# Patient Record
Sex: Male | Born: 1955 | Race: White | Hispanic: No | Smoking: Current every day smoker
Health system: Southern US, Community
[De-identification: ages and names within clinical notes are randomized; demographics above are authoritative.]

## PROBLEM LIST (undated history)

## (undated) DIAGNOSIS — S8261XA Displaced fracture of lateral malleolus of right fibula, initial encounter for closed fracture: Secondary | ICD-10-CM

## (undated) DIAGNOSIS — G4730 Sleep apnea, unspecified: Secondary | ICD-10-CM

## (undated) DIAGNOSIS — I10 Essential (primary) hypertension: Secondary | ICD-10-CM

## (undated) DIAGNOSIS — M1990 Unspecified osteoarthritis, unspecified site: Secondary | ICD-10-CM

## (undated) DIAGNOSIS — E119 Type 2 diabetes mellitus without complications: Secondary | ICD-10-CM

## (undated) HISTORY — PX: NO PAST SURGERIES: SHX2092

## (undated) HISTORY — PX: LEG SURGERY: SHX1003

## (undated) HISTORY — PX: TONSILLECTOMY: SHX5217

## (undated) HISTORY — PX: FRACTURE SURGERY: SHX138

---

## 1998-09-12 ENCOUNTER — Ambulatory Visit (HOSPITAL_COMMUNITY): Admission: RE | Admit: 1998-09-12 | Discharge: 1998-09-12 | Payer: Self-pay | Admitting: Urology

## 2001-03-31 ENCOUNTER — Emergency Department (HOSPITAL_COMMUNITY): Admission: EM | Admit: 2001-03-31 | Discharge: 2001-03-31 | Payer: Self-pay

## 2001-10-06 ENCOUNTER — Encounter: Payer: Self-pay | Admitting: Family Medicine

## 2001-10-06 ENCOUNTER — Encounter: Admission: RE | Admit: 2001-10-06 | Discharge: 2001-10-06 | Payer: Self-pay | Admitting: Family Medicine

## 2004-06-04 ENCOUNTER — Ambulatory Visit: Payer: Self-pay | Admitting: Oncology

## 2004-12-28 ENCOUNTER — Ambulatory Visit: Payer: Self-pay | Admitting: Oncology

## 2014-04-21 ENCOUNTER — Institutional Professional Consult (permissible substitution): Payer: Self-pay | Admitting: Neurology

## 2017-05-14 ENCOUNTER — Other Ambulatory Visit: Payer: Self-pay | Admitting: Internal Medicine

## 2017-05-14 DIAGNOSIS — R944 Abnormal results of kidney function studies: Secondary | ICD-10-CM

## 2017-05-19 ENCOUNTER — Ambulatory Visit
Admission: RE | Admit: 2017-05-19 | Discharge: 2017-05-19 | Disposition: A | Discharge status: Home / Self Care | Payer: Managed Care, Other (non HMO) | Source: Ambulatory Visit | Attending: Internal Medicine | Admitting: Internal Medicine

## 2017-05-19 DIAGNOSIS — R944 Abnormal results of kidney function studies: Secondary | ICD-10-CM

## 2017-05-19 LAB — IFOBT (OCCULT BLOOD): IFOBT: NEGATIVE

## 2019-03-09 ENCOUNTER — Encounter: Payer: Self-pay | Admitting: Student

## 2019-03-09 NOTE — H&P (Signed)
TOTAL HIP ADMISSION H&P  Patient is admitted for right total hip arthroplasty.  Subjective:  Chief Complaint: right hip pain  HPI: Gavin Hopkins, 63 y.o. male, has a history of pain and functional disability in the right hip(s) due to arthritis and patient has failed non-surgical conservative treatments for greater than 12 weeks to include NSAID's and/or analgesics and activity modification.  Onset of symptoms was abrupt starting 8 months ago with gradually worsening course since that time.The patient noted no past surgery on the right hip(s).  Patient currently rates pain in the right hip at 8 out of 10 with activity. Patient has night pain, worsening of pain with activity and weight bearing and pain that interfers with activities of daily living. 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 This condition presents safety issues increasing the risk of falls. There is no current active infection.  There are no active problems to display for this patient.  History reviewed. No pertinent past medical history.  History reviewed. No pertinent surgical history.  No current facility-administered medications for this encounter.    No current outpatient medications on file.   Not on File  Social History   Tobacco Use   Smoking status: Not on file  Substance Use Topics   Alcohol use: Not on file    History reviewed. No pertinent family history.   Review of Systems  Constitutional: Negative for chills and fever.  HENT: Negative for congestion, sore throat and tinnitus.   Eyes: Negative for double vision, photophobia and pain.  Respiratory: Negative for cough, shortness of breath and wheezing.   Cardiovascular: Negative for chest pain, palpitations and orthopnea.  Gastrointestinal: Negative for heartburn, nausea and vomiting.    Genitourinary: Negative for dysuria, frequency and urgency.  Musculoskeletal: Positive for joint pain.  Neurological: Negative for dizziness, weakness and headaches.    Objective:  Physical Exam  Well nourished and well developed.  General: Alert and oriented x3, cooperative and pleasant, no acute distress.  Head: normocephalic, atraumatic, neck supple.  Eyes: EOMI.  Respiratory: breath sounds clear in all fields, no wheezing, rales, or rhonchi. Cardiovascular: Regular rate and rhythm, no murmurs, gallops or rubs.  Abdomen: non-tender to palpation and soft, normoactive bowel sounds. Musculoskeletal:  Right Hip Exam: The range of motion: Flexion to 100 degrees, Internal Rotation 0 degrees, External Rotation to 10 degrees, and abduction to 10-20 degrees without pain during flexion and internal rotation. There is no tenderness over the greater trochanter bursa.   Calves soft and nontender. Motor function intact in LE. Strength 5/5 LE bilaterally. Neuro: Distal pulses 2+. Sensation to light touch intact in LE.  Vital signs in last 24 hours: Blood pressure: 134/90 mmHg Pulse: 88 bpm  Labs:   There is no height or weight on file to calculate BMI.   Imaging Review Plain radiographs demonstrate severe degenerative joint disease of the right hip(s). The bone quality appears to be adequate for age and reported activity level.  Assessment/Plan:  End stage arthritis, right hip(s)  The patient history, physical examination, clinical judgement of the provider and imaging studies are consistent with end stage degenerative joint disease of the right hip(s) and total hip arthroplasty is deemed medically necessary. The treatment options including medical management, injection therapy, arthroscopy and arthroplasty were discussed at length. The risks and benefits of total hip arthroplasty were presented and reviewed. The risks  due to aseptic loosening, infection, stiffness,  dislocation/subluxation,  thromboembolic complications and other imponderables were discussed.  The patient acknowledged the explanation, agreed to proceed with the plan and consent was signed. Patient is being admitted for inpatient treatment for surgery, pain control, PT, OT, prophylactic antibiotics, VTE prophylaxis, progressive ambulation and ADL's and discharge planning.The patient is planning to be discharged home.   Anticipated LOS equal to or greater than 2 midnights due to - Age 53 and older with one or more of the following:  - Obesity  - Expected need for hospital services (PT, OT, Nursing) required for safe  discharge  - Anticipated need for postoperative skilled nursing care or inpatient rehab  - Active co-morbidities: None OR   - Unanticipated findings during/Post Surgery: None  - Patient is a high risk of re-admission due to: None  Therapy Plans: HEP Disposition: Home with son for first two nights, then neighbor available Planned DVT Prophylaxis: Aspirin 325 mg BID DME needed: [facility] PCP: Leanna Battles, MD TXA: IV Allergies: NKDA Anesthesia Concerns: None BMI: 30.4 Last HgbA1c: Unsure. Will order with preop labs  Other: Pt is a 3/4 ppd smoker, discussed cutting back 2 weeks prior to surgery Currently takes 325 mg ASA QD Post-operative pain management will be complicated by chronic opioid use. Takes two Norco 7.5-325 QD.  - Patient was instructed on what medications to stop prior to surgery. - Follow-up visit in 2 weeks with Dr. Wynelle Link - Begin physical therapy following surgery - Pre-operative lab work as pre-surgical testing - Prescriptions will be provided in hospital at time of discharge  Theresa Duty, PA-C Orthopedic Surgery [facility] [place]

## 2019-03-25 ENCOUNTER — Other Ambulatory Visit (HOSPITAL_COMMUNITY): Payer: Self-pay | Admitting: *Deleted

## 2019-03-25 NOTE — Patient Instructions (Addendum)
DUE TO COVID-19 ONLY ONE VISITOR IS ALLOWED TO COME WITH YOU AND STAY IN THE WAITING ROOM ONLY DURING PRE OP AND PROCEDURE DAY OF SURGERY. THE 1 VISITOR MAY VISIT WITH YOU AFTER SURGERY IN YOUR PRIVATE ROOM DURING VISITING HOURS ONLY!  YOU HAD  A COVID 19 TEST ON 03-27-2019. ONCE YOUR COVID TEST IS COMPLETED, PLEASE BEGIN THE QUARANTINE INSTRUCTIONS AS OUTLINED IN YOUR HANDOUT.                Gerrit Schreiner    Your procedure is scheduled on: 03-31-2019   Report to [facility] Main  Entrance   Report to admitting at 1025 AM     Call this number if you have problems the morning of surgery [phone]    Remember: [place], YOU MAY RINSE YOUR  MOUTH OUT, NO [place].   NO SOLID FOOD AFTER MIDNIGHT THE NIGHT PRIOR TO SURGERY. NOTHING BY MOUTH EXCEPT CLEAR LIQUIDS UNTIL 955 AM . PLEASE FINISH G 2 DRINK PER SURGEON ORDER  WHICH NEEDS TO BE COMPLETED AT 955 AM .   CLEAR LIQUID DIET   Foods Allowed                                                                     Foods Excluded  Coffee and tea, regular and decaf                             liquids that you cannot  Plain Jell-O any favor except red or purple                                           see through such as: Fruit ices (not with fruit pulp)                                     milk, soups, orange juice  Iced Popsicles                                    All solid food Carbonated beverages, regular and diet                                    Cranberry, grape and apple juices Sports drinks like Gatorade Lightly seasoned clear broth or consume(fat free) Sugar, honey syrup  Sample Menu Breakfast                                Lunch                                     Supper Cranberry juice  Beef broth                            Chicken broth Jell-O                                     Grape juice                           Apple juice Coffee or tea                         Jell-O                                      Popsicle                                                Coffee or tea                        Coffee or tea  _____________________________________________________________________     Take these medicines the morning of surgery with A SIP OF WATER: HYDROCODONE IF NEEDED, LOVASTATIN, AMLODIPINE (NORVASC), CYCLOBENZAPRINE (FLEXERIL)  How to Manage Your Diabetes Before and After Surgery    WHAT DO I DO ABOUT MY DIABETES MEDICATION?  Marland Kitchen Do not take oral diabetes medicines (pills) the morning of surgery.  . THE  DAY BEFORE SURGERY  TAKE METFORMIN AS USUAL.   . THE MORNING OF SURGERY DO NOT TAKE METFORMIN.  DO NOT TAKE ANY DIABETIC MEDICATIONS DAY OF YOUR SURGERY                               You may not have any metal on your body including hair pins and              piercings  Do not wear jewelry, make-up, lotions, powders or perfumes, deodorant                         Men may shave face and neck.   Do not bring valuables to the hospital. [place].  Contacts, dentures or bridgework may not be worn into surgery.  Leave suitcase in the car. After surgery it may be brought to your room.     _____________________________________________________________________             [facility] - Preparing for Surgery Before surgery, you can play an important role.  Because skin is not sterile, your skin needs to be as free of germs as possible.  You can reduce the number of germs on your skin by washing with CHG (chlorahexidine gluconate) soap before surgery.  CHG is an antiseptic cleaner which kills germs and bonds with the skin to continue killing germs even after washing. Please DO NOT use if you have an allergy to CHG or antibacterial soaps.  If your skin becomes reddened/irritated stop using the  CHG and inform your nurse when you arrive at Short Stay. Do not shave (including legs and underarms)  for at least 48 hours prior to the first CHG shower.  You may shave your face/neck. Please follow these instructions carefully:  1.  Shower with CHG Soap the night before surgery and the  morning of Surgery.  2.  If you choose to wash your hair, wash your hair first as usual with your  normal  shampoo.  3.  After you shampoo, rinse your hair and body thoroughly to remove the  shampoo.                           4.  Use CHG as you would any other liquid soap.  You can apply chg directly  to the skin and wash                       Gently with a scrungie or clean washcloth.  5.  Apply the CHG Soap to your body ONLY FROM THE NECK DOWN.   Do not use on face/ open                           Wound or open sores. Avoid contact with eyes, ears mouth and genitals (private parts).                       Wash face,  Genitals (private parts) with your normal soap.             6.  Wash thoroughly, paying special attention to the area where your surgery  will be performed.  7.  Thoroughly rinse your body with warm water from the neck down.  8.  DO NOT shower/wash with your normal soap after using and rinsing off  the CHG Soap.                9.  Pat yourself dry with a clean towel.            10.  Wear clean pajamas.            11.  Place clean sheets on your bed the night of your first shower and do not  sleep with pets. Day of Surgery : Do not apply any lotions/deodorants the morning of surgery.  Please wear clean clothes to the hospital/surgery center.  FAILURE TO FOLLOW THESE INSTRUCTIONS MAY RESULT IN THE CANCELLATION OF YOUR SURGERY PATIENT SIGNATURE_________________________________  NURSE SIGNATURE__________________________________  ________________________________________________________________________   Adam Phenix  An incentive spirometer is a tool that can help keep your lungs clear and active. This tool measures how well you are filling your lungs with each breath. Taking long deep  breaths may help reverse or decrease the chance of developing breathing (pulmonary) problems (especially infection) following:  A long period of time when you are unable to move or be active. BEFORE THE PROCEDURE   If the spirometer includes an indicator to show your best effort, your nurse or respiratory therapist will set it to a desired goal.  If possible, sit up straight or lean slightly forward. Try not to slouch.  Hold the incentive spirometer in an upright position. INSTRUCTIONS FOR USE  1. Sit on the edge of your bed if possible, or sit up as far as you can in bed or on a chair. 2. Hold the incentive spirometer in  an upright position. 3. Breathe out normally. 4. Place the mouthpiece in your mouth and seal your lips tightly around it. 5. Breathe in slowly and as deeply as possible, raising the piston or the ball toward the top of the column. 6. Hold your breath for 3-5 seconds or for as long as possible. Allow the piston or ball to fall to the bottom of the column. 7. Remove the mouthpiece from your mouth and breathe out normally. 8. Rest for a few seconds and repeat Steps 1 through 7 at least 10 times every 1-2 hours when you are awake. Take your time and take a few normal breaths between deep breaths. 9. The spirometer may include an indicator to show your best effort. Use the indicator as a goal to work toward during each repetition. 10. After each set of 10 deep breaths, practice coughing to be sure your lungs are clear. If you have an incision (the cut made at the time of surgery), support your incision when coughing by placing a pillow or rolled up towels firmly against it. Once you are able to get out of bed, walk around indoors and cough well. You may stop using the incentive spirometer when instructed by your caregiver.  RISKS AND COMPLICATIONS  Take your time so you do not get dizzy or light-headed.  If you are in pain, you may need to take or ask for pain medication before  doing incentive spirometry. It is harder to take a deep breath if you are having pain. AFTER USE  Rest and breathe slowly and easily.  It can be helpful to keep track of a log of your progress. Your caregiver can provide you with a simple table to help with this. If you are using the spirometer at home, follow these instructions: [place] IF:   You are having difficultly using the spirometer.  You have trouble using the spirometer as often as instructed.  Your pain medication is not giving enough relief while using the spirometer.  You develop fever of 100.5 F (38.1 C) or higher. SEEK IMMEDIATE MEDICAL CARE IF:   You cough up bloody sputum that had not been present before.  You develop fever of 102 F (38.9 C) or greater.  You develop worsening pain at or near the incision site. MAKE SURE YOU:   Understand these instructions.  Will watch your condition.  Will get help right away if you are not doing well or get worse. Document Released: 11/25/2006 Document Revised: 10/07/2011 Document Reviewed: 01/26/2007 ExitCare Patient Information 2014 ExitCare, [place].   ________________________________________________________________________  WHAT IS A BLOOD TRANSFUSION? Blood Transfusion Information  A transfusion is the replacement of blood or some of its parts. Blood is made up of multiple cells which provide different functions.  Red blood cells carry oxygen and are used for blood loss replacement.  White blood cells fight against infection.  Platelets control bleeding.  Plasma helps clot blood.  Other blood products are available for specialized needs, such as hemophilia or other clotting disorders. BEFORE THE TRANSFUSION  Who gives blood for transfusions?   Healthy volunteers who are fully evaluated to make sure their blood is safe. This is blood bank blood. Transfusion therapy is the safest it has ever been in the practice of medicine. Before blood is taken  from a donor, a complete history is taken to make sure that person has no history of diseases nor engages in risky social behavior (examples are intravenous drug use or sexual activity  with multiple partners). The donor's travel history is screened to minimize risk of transmitting infections, such as malaria. The donated blood is tested for signs of infectious diseases, such as HIV and hepatitis. The blood is then tested to be sure it is compatible with you in order to minimize the chance of a transfusion reaction. If you or a relative donates blood, this is often done in anticipation of surgery and is not appropriate for emergency situations. It takes many days to process the donated blood. RISKS AND COMPLICATIONS Although transfusion therapy is very safe and saves many lives, the main dangers of transfusion include:   Getting an infectious disease.  Developing a transfusion reaction. This is an allergic reaction to something in the blood you were given. Every precaution is taken to prevent this. The decision to have a blood transfusion has been considered carefully by your caregiver before blood is given. Blood is not given unless the benefits outweigh the risks. AFTER THE TRANSFUSION  Right after receiving a blood transfusion, you will usually feel much better and more energetic. This is especially true if your red blood cells have gotten low (anemic). The transfusion raises the level of the red blood cells which carry oxygen, and this usually causes an energy increase.  The nurse administering the transfusion will monitor you carefully for complications. HOME CARE INSTRUCTIONS  No special instructions are needed after a transfusion. You may find your energy is better. Speak with your caregiver about any limitations on activity for underlying diseases you may have. SEEK MEDICAL CARE IF:   Your condition is not improving after your transfusion.  You develop redness or irritation at the  intravenous (IV) site. SEEK IMMEDIATE MEDICAL CARE IF:  Any of the following symptoms occur over the next 12 hours:  Shaking chills.  You have a temperature by mouth above 102 F (38.9 C), not controlled by medicine.  Chest, back, or muscle pain.  People around you feel you are not acting correctly or are confused.  Shortness of breath or difficulty breathing.  Dizziness and fainting.  You get a rash or develop hives.  You have a decrease in urine output.  Your urine turns a dark color or changes to pink, red, or brown. Any of the following symptoms occur over the next 10 days:  You have a temperature by mouth above 102 F (38.9 C), not controlled by medicine.  Shortness of breath.  Weakness after normal activity.  The white part of the eye turns yellow (jaundice).  You have a decrease in the amount of urine or are urinating less often.  Your urine turns a dark color or changes to pink, red, or brown. Document Released: 07/12/2000 Document Revised: 10/07/2011 Document Reviewed: 02/29/2008 [facility] Patient Information 2014 [place], [place].  _______________________________________________________________________

## 2019-03-27 ENCOUNTER — Other Ambulatory Visit (HOSPITAL_COMMUNITY)
Admission: RE | Admit: 2019-03-27 | Discharge: 2019-03-27 | Disposition: A | Discharge status: Home / Self Care | Payer: 59 | Source: Ambulatory Visit | Attending: Orthopedic Surgery | Admitting: Orthopedic Surgery

## 2019-03-27 DIAGNOSIS — Z20828 Contact with and (suspected) exposure to other viral communicable diseases: Secondary | ICD-10-CM | POA: Diagnosis not present

## 2019-03-27 DIAGNOSIS — Z01812 Encounter for preprocedural laboratory examination: Secondary | ICD-10-CM | POA: Insufficient documentation

## 2019-03-27 LAB — SARS CORONAVIRUS 2 (TAT 6-24 HRS): SARS Coronavirus 2: NEGATIVE

## 2019-03-29 ENCOUNTER — Encounter (HOSPITAL_COMMUNITY): Payer: Self-pay

## 2019-03-29 ENCOUNTER — Inpatient Hospital Stay (HOSPITAL_COMMUNITY): Payer: 59 | Admitting: Physician Assistant

## 2019-03-29 ENCOUNTER — Other Ambulatory Visit: Payer: Self-pay

## 2019-03-29 ENCOUNTER — Inpatient Hospital Stay (HOSPITAL_COMMUNITY): Payer: 59 | Admitting: Certified Registered"

## 2019-03-29 ENCOUNTER — Encounter (HOSPITAL_COMMUNITY)
Admission: RE | Admit: 2019-03-29 | Discharge: 2019-03-29 | Disposition: A | Discharge status: Home / Self Care | Payer: 59 | Source: Ambulatory Visit | Attending: Orthopedic Surgery | Admitting: Orthopedic Surgery

## 2019-03-29 DIAGNOSIS — Z7982 Long term (current) use of aspirin: Secondary | ICD-10-CM | POA: Diagnosis not present

## 2019-03-29 DIAGNOSIS — I452 Bifascicular block: Secondary | ICD-10-CM | POA: Insufficient documentation

## 2019-03-29 DIAGNOSIS — R9431 Abnormal electrocardiogram [ECG] [EKG]: Secondary | ICD-10-CM | POA: Insufficient documentation

## 2019-03-29 DIAGNOSIS — F17210 Nicotine dependence, cigarettes, uncomplicated: Secondary | ICD-10-CM | POA: Diagnosis not present

## 2019-03-29 DIAGNOSIS — M1612 Unilateral primary osteoarthritis, left hip: Secondary | ICD-10-CM | POA: Insufficient documentation

## 2019-03-29 DIAGNOSIS — E119 Type 2 diabetes mellitus without complications: Secondary | ICD-10-CM | POA: Insufficient documentation

## 2019-03-29 DIAGNOSIS — R000 Tachycardia, unspecified: Secondary | ICD-10-CM | POA: Insufficient documentation

## 2019-03-29 DIAGNOSIS — Z79891 Long term (current) use of opiate analgesic: Secondary | ICD-10-CM | POA: Diagnosis not present

## 2019-03-29 DIAGNOSIS — M25551 Pain in right hip: Secondary | ICD-10-CM | POA: Diagnosis present

## 2019-03-29 DIAGNOSIS — M1611 Unilateral primary osteoarthritis, right hip: Secondary | ICD-10-CM | POA: Diagnosis not present

## 2019-03-29 DIAGNOSIS — Z538 Procedure and treatment not carried out for other reasons: Secondary | ICD-10-CM | POA: Diagnosis not present

## 2019-03-29 DIAGNOSIS — Z01818 Encounter for other preprocedural examination: Secondary | ICD-10-CM | POA: Insufficient documentation

## 2019-03-29 DIAGNOSIS — I10 Essential (primary) hypertension: Secondary | ICD-10-CM | POA: Insufficient documentation

## 2019-03-29 HISTORY — DX: Unspecified osteoarthritis, unspecified site: M19.90

## 2019-03-29 HISTORY — DX: Essential (primary) hypertension: I10

## 2019-03-29 HISTORY — DX: Displaced fracture of lateral malleolus of right fibula, initial encounter for closed fracture: S82.61XA

## 2019-03-29 HISTORY — DX: Sleep apnea, unspecified: G47.30

## 2019-03-29 HISTORY — DX: Type 2 diabetes mellitus without complications: E11.9

## 2019-03-29 LAB — COMPREHENSIVE METABOLIC PANEL
ALT: 58 U/L — ABNORMAL HIGH (ref 0–44)
AST: 37 U/L (ref 15–41)
Albumin: 4.4 g/dL (ref 3.5–5.0)
Alkaline Phosphatase: 70 U/L (ref 38–126)
Anion gap: 8 (ref 5–15)
BUN: 25 mg/dL — ABNORMAL HIGH (ref 8–23)
CO2: 27 mmol/L (ref 22–32)
Calcium: 9.7 mg/dL (ref 8.9–10.3)
Chloride: 104 mmol/L (ref 98–111)
Creatinine, Ser: 1.36 mg/dL — ABNORMAL HIGH (ref 0.61–1.24)
GFR calc Af Amer: >60 mL/min (ref >60)
GFR calc non Af Amer: 54 mL/min — ABNORMAL LOW (ref >60)
Glucose, Bld: 121 mg/dL — ABNORMAL HIGH (ref 70–99)
Potassium: 4.2 mmol/L (ref 3.5–5.1)
Sodium: 139 mmol/L (ref 135–145)
Total Bilirubin: 0.5 mg/dL (ref 0.3–1.2)
Total Protein: 8.3 g/dL — ABNORMAL HIGH (ref 6.5–8.1)

## 2019-03-29 LAB — CBC
HCT: 46.3 % (ref 39.0–52.0)
Hemoglobin: 15.4 g/dL (ref 13.0–17.0)
MCH: 32.3 pg (ref 26.0–34.0)
MCHC: 33.3 g/dL (ref 30.0–36.0)
MCV: 97.1 fL (ref 80.0–100.0)
Platelets: 279 10*3/uL (ref 150–400)
RBC: 4.77 MIL/uL (ref 4.22–5.81)
RDW: 13.2 % (ref 11.5–15.5)
WBC: 12.4 10*3/uL — ABNORMAL HIGH (ref 4.0–10.5)
nRBC: 0 % (ref 0.0–0.2)

## 2019-03-29 LAB — ABO/RH: ABO/RH(D): A POS

## 2019-03-29 LAB — HEMOGLOBIN A1C
Hgb A1c MFr Bld: 6.3 % — ABNORMAL HIGH (ref 4.8–5.6)
Mean Plasma Glucose: 134.11 mg/dL

## 2019-03-29 LAB — PROTIME-INR
INR: 0.9 (ref 0.8–1.2)
Prothrombin Time: 12.5 seconds (ref 11.4–15.2)

## 2019-03-29 LAB — SURGICAL PCR SCREEN
MRSA, PCR: NEGATIVE
Staphylococcus aureus: NEGATIVE

## 2019-03-29 LAB — GLUCOSE, CAPILLARY: Glucose-Capillary: 182 mg/dL — ABNORMAL HIGH (ref 70–99)

## 2019-03-29 LAB — APTT: aPTT: 30 seconds (ref 24–36)

## 2019-03-29 NOTE — Progress Notes (Signed)
PCP -  Dr Bevelyn Buckles Cardiologist - none  Chest x-ray - none EKG - 03-29-2019 epic Stress Test - none ECHO - none Cardiac Cath - none  Sleep Study - years ago, none recent CPAP - could not tolerate cpap  Fasting Blood Sugar - patient does not check cbg at home Checks Blood Sugar _____ times a day  Blood Thinner Instructions: Aspirin Instructions: 325 mg aspirin Last Dose: 03-19-2019 stopped per dr Wynelle Link instructions  Anesthesia review:   Patient denies shortness of breath, fever, cough and chest pain at PAT appointment   Patient verbalized understanding of instructions that were given to them at the PAT appointment. Patient was also instructed that they will need to review over the PAT instructions again at home before surgery.

## 2019-03-31 ENCOUNTER — Encounter (HOSPITAL_COMMUNITY)
Admission: RE | Disposition: A | Discharge status: Home / Self Care | Payer: Self-pay | Source: Home / Self Care | Attending: Orthopedic Surgery

## 2019-03-31 ENCOUNTER — Encounter (HOSPITAL_COMMUNITY): Payer: Self-pay

## 2019-03-31 ENCOUNTER — Ambulatory Visit (HOSPITAL_COMMUNITY)
Admission: RE | Admit: 2019-03-31 | Discharge: 2019-03-31 | Disposition: A | Discharge status: Home / Self Care | Payer: 59 | Attending: Orthopedic Surgery | Admitting: Orthopedic Surgery

## 2019-03-31 DIAGNOSIS — F17210 Nicotine dependence, cigarettes, uncomplicated: Secondary | ICD-10-CM | POA: Insufficient documentation

## 2019-03-31 DIAGNOSIS — I452 Bifascicular block: Secondary | ICD-10-CM | POA: Insufficient documentation

## 2019-03-31 DIAGNOSIS — M1611 Unilateral primary osteoarthritis, right hip: Secondary | ICD-10-CM | POA: Diagnosis not present

## 2019-03-31 DIAGNOSIS — Z538 Procedure and treatment not carried out for other reasons: Secondary | ICD-10-CM | POA: Insufficient documentation

## 2019-03-31 DIAGNOSIS — Z7982 Long term (current) use of aspirin: Secondary | ICD-10-CM | POA: Insufficient documentation

## 2019-03-31 DIAGNOSIS — Z79891 Long term (current) use of opiate analgesic: Secondary | ICD-10-CM | POA: Insufficient documentation

## 2019-03-31 DIAGNOSIS — R000 Tachycardia, unspecified: Secondary | ICD-10-CM | POA: Insufficient documentation

## 2019-03-31 LAB — TYPE AND SCREEN
ABO/RH(D): A POS
Antibody Screen: NEGATIVE

## 2019-03-31 LAB — GLUCOSE, CAPILLARY: Glucose-Capillary: 130 mg/dL — ABNORMAL HIGH (ref 70–99)

## 2019-03-31 SURGERY — ARTHROPLASTY, HIP, TOTAL, ANTERIOR APPROACH
Anesthesia: Choice | Laterality: Right

## 2019-03-31 MED ORDER — PROPOFOL 10 MG/ML IV BOLUS
INTRAVENOUS | Status: AC
  Filled 2019-03-31: qty 60

## 2019-03-31 MED ORDER — LACTATED RINGERS IV SOLN: INTRAVENOUS | Status: DC

## 2019-03-31 MED ORDER — LIDOCAINE 2% (20 MG/ML) 5 ML SYRINGE
INTRAMUSCULAR | Status: AC
  Filled 2019-03-31: qty 5

## 2019-03-31 MED ORDER — CEFAZOLIN SODIUM-DEXTROSE 2-4 GM/100ML-% IV SOLN
2.0000 g | INTRAVENOUS | Status: DC
  Filled 2019-03-31: qty 100

## 2019-03-31 MED ORDER — TRANEXAMIC ACID-NACL 1000-0.7 MG/100ML-% IV SOLN
1000.0000 mg | INTRAVENOUS | Status: DC
  Filled 2019-03-31: qty 100

## 2019-03-31 MED ORDER — DEXAMETHASONE SODIUM PHOSPHATE 10 MG/ML IJ SOLN: 8.0000 mg | Freq: Once | INTRAMUSCULAR | Status: DC

## 2019-03-31 MED ORDER — MIDAZOLAM HCL 2 MG/2ML IJ SOLN
INTRAMUSCULAR | Status: AC
  Filled 2019-03-31: qty 2

## 2019-03-31 MED ORDER — ONDANSETRON HCL 4 MG/2ML IJ SOLN
INTRAMUSCULAR | Status: AC
  Filled 2019-03-31: qty 2

## 2019-03-31 MED ORDER — POVIDONE-IODINE 10 % EX SWAB: 2.0000 "application " | Freq: Once | CUTANEOUS | Status: DC

## 2019-03-31 MED ORDER — CHLORHEXIDINE GLUCONATE 4 % EX LIQD: 60.0000 mL | Freq: Once | CUTANEOUS | Status: DC

## 2019-03-31 MED ORDER — ACETAMINOPHEN 10 MG/ML IV SOLN
1000.0000 mg | Freq: Four times a day (QID) | INTRAVENOUS | Status: DC
  Filled 2019-03-31: qty 100

## 2019-03-31 MED ORDER — DEXAMETHASONE SODIUM PHOSPHATE 10 MG/ML IJ SOLN
INTRAMUSCULAR | Status: AC
  Filled 2019-03-31: qty 1

## 2019-03-31 MED ORDER — FENTANYL CITRATE (PF) 100 MCG/2ML IJ SOLN
INTRAMUSCULAR | Status: AC
  Filled 2019-03-31: qty 2

## 2019-03-31 NOTE — Progress Notes (Signed)
Pre-op ECG confirmed by Cardiologist as sinus tach with bifascicular block. No prior ECG in Epic for comparison. Patient tachycardic in pre-op area, ~ 110 - 120's. Attempt made to speak with PCP at [facility], however Dr. Sharlett Iles not available. Options of intravenous beta blockers versus rescheduling surgery presented to patient and surgeon. Decision made to have PCP evaluate heart rate prior to surgery.

## 2019-03-31 NOTE — Anesthesia Preprocedure Evaluation (Signed)
Anesthesia Evaluation    Reviewed: Allergy & Precautions, Patient's Chart, lab work & pertinent test results  Airway        Dental   Pulmonary sleep apnea , Current Smoker,           Cardiovascular hypertension, Pt. on medications   ECG: ST, rate 116. Sinus tachycardia with occasional Premature ventricular complexes Right bundle branch block Left anterior fascicular block Bifascicular block    Neuro/Psych negative neurological ROS     GI/Hepatic negative GI ROS, Neg liver ROS,   Endo/Other  diabetes, Oral Hypoglycemic Agents  Renal/GU negative Renal ROS     Musculoskeletal negative musculoskeletal ROS (+)   Abdominal (+) + obese,   Peds  Hematology HLD   Anesthesia Other Findings right hip osteoarthritis  Reproductive/Obstetrics                             Anesthesia Physical Anesthesia Plan  ASA: II  Anesthesia Plan: Spinal   Post-op Pain Management:    Induction:   PONV Risk Score and Plan:   Airway Management Planned:   Additional Equipment:   Intra-op Plan:   Post-operative Plan:   Informed Consent:   Plan Discussed with:   Anesthesia Plan Comments:         Anesthesia Quick Evaluation

## 2019-03-31 NOTE — Interval H&P Note (Signed)
History and Physical Interval Note:  03/31/2019 10:53 AM  Gavin Hopkins  has presented today for surgery, with the diagnosis of right hip osteoarthritis.  The various methods of treatment have been discussed with the patient and family. After consideration of risks, benefits and other options for treatment, the patient has consented to  Procedure(s) with comments: [place] (Right) - 127min as a surgical intervention.  The patient's history has been reviewed, patient examined, no change in status, stable for surgery.  I have reviewed the patient's chart and labs.  Questions were answered to the patient's satisfaction.     Pilar Plate Makenlee Mckeag

## 2019-03-31 NOTE — Progress Notes (Signed)
Pt d/c home, taken to main lobby via wheelchair.  Pt surgery canceled today due to tachycardia.  Pt reminded to call [place] right away to see Dr. Threasa Beards.  Pt voiced understanding.

## 2019-04-26 ENCOUNTER — Other Ambulatory Visit: Payer: Self-pay

## 2019-04-26 ENCOUNTER — Encounter: Payer: Self-pay | Admitting: Cardiology

## 2019-04-26 ENCOUNTER — Ambulatory Visit: Payer: 59 | Admitting: Cardiology

## 2019-04-26 VITALS — BP 137/97 | HR 116 | Temp 97.8°F | Ht 68.0 in | Wt 207.6 lb

## 2019-04-26 DIAGNOSIS — Z720 Tobacco use: Secondary | ICD-10-CM | POA: Diagnosis not present

## 2019-04-26 DIAGNOSIS — E782 Mixed hyperlipidemia: Secondary | ICD-10-CM

## 2019-04-26 DIAGNOSIS — I471 Supraventricular tachycardia: Secondary | ICD-10-CM

## 2019-04-26 DIAGNOSIS — R000 Tachycardia, unspecified: Secondary | ICD-10-CM

## 2019-04-26 DIAGNOSIS — I10 Essential (primary) hypertension: Secondary | ICD-10-CM | POA: Diagnosis not present

## 2019-04-26 DIAGNOSIS — I4719 Other supraventricular tachycardia: Secondary | ICD-10-CM

## 2019-04-26 DIAGNOSIS — Z01810 Encounter for preprocedural cardiovascular examination: Secondary | ICD-10-CM

## 2019-04-26 MED ORDER — VERAPAMIL HCL ER 240 MG PO TBCR: 240.0000 mg | EXTENDED_RELEASE_TABLET | Freq: Every day | ORAL | 1 refills | Status: DC

## 2019-04-26 NOTE — Progress Notes (Signed)
Primary Physician:  Leanna Battles, MD   Patient ID: Gavin Hopkins, male    DOB: 1956-05-20, 63 y.o.   MRN: [id number]  Subjective:    Chief Complaint  Patient presents with  . New Patient (Initial Visit)  . RBBB  . Tachycardia    HPI: Gavin Hopkins  is a 63 y.o. male  with hypertension, type 2 diabetes, OSA not on CPAP due to intolerance, current smoker, and hyperlipidemia referred to [place] on STAT basis on Dr. Philip Aspen for tachycardia.  Patient recently presented for left hip surgery on 03/31/19; however, due to tachycardia, procedure was cancelled. At his follow up with his PCP, he was started on metoprolol succinate 25 mg daily; however, his heart rate did not respond. He reports that his metoprolol has been adjusted several times up to now 100 mg daily, and his heart rate continues to be elevated.  Patient denies any symptoms, no chest pain, dyspnea on exertion, palpitations, syncope, leg swelling, of symptoms suggestive of claudication or TIA.  He is currently out of work due to his left hip pain. He works as a Chemical engineer at [employer].   He is 3/4 a pack per day smoker. Drinks 2-3 mixed drinks per day. No illicit drug use.  Past Medical History:  Diagnosis Date  . Arthritis    oa  . Diabetes mellitus without complication ([place])    type 2  . Displaced fracture of lateral malleolus of right fibula, initial encounter for closed fracture 30 yrs ago   and tibula, full cast applied  . Hypertension   . Sleep apnea    could not tolerate cpap    Past Surgical History:  Procedure Laterality Date  . LEG SURGERY Right    over 30 yrs ago  . NO PAST SURGERIES      Social History   Socioeconomic History  . Marital status: Divorced    Spouse name: Not on file  . Number of children: 1  . Years of education: Not on file  . Highest education level: Not on file  Occupational History  . Not on file  Social Needs  . Financial resource strain: Not on file  . Food  insecurity    Worry: Not on file    Inability: Not on file  . Transportation needs    Medical: Not on file    Non-medical: Not on file  Tobacco Use  . Smoking status: Current Every Day Smoker    Packs/day: 0.75    Types: Cigarettes  . Smokeless tobacco: Never Used  Substance and Sexual Activity  . Alcohol use: Yes    Comment: 2 drinks per night  . Drug use: Never  . Sexual activity: Not on file  Lifestyle  . Physical activity    Days per week: Not on file    Minutes per session: Not on file  . Stress: Not on file  Relationships  . Social Herbalist on phone: Not on file    Gets together: Not on file    Attends religious service: Not on file    Active member of club or organization: Not on file    Attends meetings of clubs or organizations: Not on file    Relationship status: Not on file  . Intimate partner violence    Fear of current or ex partner: Not on file    Emotionally abused: Not on file    Physically abused: Not on file    Forced sexual  activity: Not on file  Other Topics Concern  . Not on file  Social History Narrative  . Not on file    Review of Systems  Constitution: Negative for decreased appetite, malaise/fatigue, weight gain and weight loss.  Eyes: Negative for visual disturbance.  Cardiovascular: Negative for chest pain, claudication, dyspnea on exertion, leg swelling, orthopnea, palpitations and syncope.  Respiratory: Negative for hemoptysis and wheezing.   Endocrine: Negative for cold intolerance and heat intolerance.  Hematologic/Lymphatic: Does not bruise/bleed easily.  Skin: Negative for nail changes.  Musculoskeletal: Positive for joint pain (left hip pain). Negative for muscle weakness and myalgias.  Gastrointestinal: Negative for abdominal pain, change in bowel habit, nausea and vomiting.  Neurological: Negative for difficulty with concentration, dizziness, focal weakness and headaches.  Psychiatric/Behavioral: Negative for altered  mental status and suicidal ideas.  All other systems reviewed and are negative.     Objective:  Blood pressure (!) 137/97, pulse (!) 116, temperature 97.8 F (36.6 C), height 5\' 8"  (1.727 m), weight 207 lb 9.6 oz (94.2 kg), SpO2 93 %. Body mass index is 31.57 kg/m.    Physical Exam  Constitutional: He is oriented to person, place, and time. Vital signs are normal. He appears well-developed and well-nourished.  HENT:  Head: Normocephalic and atraumatic.  Neck: Normal range of motion.  Cardiovascular: Regular rhythm, normal heart sounds and intact distal pulses. Tachycardia present.  Pulses:      Femoral pulses are 2+ on the right side and 2+ on the left side.      Popliteal pulses are 2+ on the right side and 2+ on the left side.       Dorsalis pedis pulses are 2+ on the right side and 1+ on the left side.       Posterior tibial pulses are 1+ on the right side and 2+ on the left side.  Pulmonary/Chest: Effort normal and breath sounds normal. No accessory muscle usage. No respiratory distress.  Abdominal: Soft. Bowel sounds are normal.  Musculoskeletal: Normal range of motion.  Neurological: He is alert and oriented to person, place, and time.  Skin: Skin is warm and dry.  Vitals reviewed.  Radiology: No results found.  Laboratory examination:    CMP Latest Ref Rng & Units 03/29/2019  Glucose 70 - 99 mg/dL 121(H)  BUN 8 - 23 mg/dL 25(H)  Creatinine 0.61 - 1.24 mg/dL 1.36(H)  Sodium 135 - 145 mmol/L 139  Potassium 3.5 - 5.1 mmol/L 4.2  Chloride 98 - 111 mmol/L 104  CO2 22 - 32 mmol/L 27  Calcium 8.9 - 10.3 mg/dL 9.7  Total Protein 6.5 - 8.1 g/dL 8.3(H)  Total Bilirubin 0.3 - 1.2 mg/dL 0.5  Alkaline Phos 38 - 126 U/L 70  AST 15 - 41 U/L 37  ALT 0 - 44 U/L 58(H)   CBC Latest Ref Rng & Units 03/29/2019  WBC 4.0 - 10.5 K/uL 12.4(H)  Hemoglobin 13.0 - 17.0 g/dL 15.4  Hematocrit 39.0 - 52.0 % 46.3  Platelets 150 - 400 K/uL 279   Lipid Panel  No results found for: CHOL,  TRIG, HDL, CHOLHDL, VLDL, LDLCALC, LDLDIRECT HEMOGLOBIN A1C Lab Results  Component Value Date   HGBA1C 6.3 (H) 03/29/2019   MPG 134.11 03/29/2019   TSH No results for input(s): TSH in the last 8760 hours.  PRN Meds:. Medications Discontinued During This Encounter  Medication Reason  . amLODipine (NORVASC) 5 MG tablet Discontinued by provider   Current Meds  Medication Sig  . Aromatic  Inhalants (VICKS BABYRUB) OINT Apply 1 application topically. 3 times weekly at bedtime  . aspirin 325 MG EC tablet Take 325 mg by mouth daily at 3 pm.  . cyclobenzaprine (FLEXERIL) 10 MG tablet Take 20 mg by mouth daily.  . hydrochlorothiazide (HYDRODIURIL) 12.5 MG tablet Take 12.5 mg by mouth daily.  Marland Kitchen HYDROcodone-acetaminophen (NORCO) 7.5-325 MG tablet Take 2 tablets by mouth daily.  Marland Kitchen losartan (COZAAR) 100 MG tablet Take 100 mg by mouth daily.  Marland Kitchen lovastatin (MEVACOR) 40 MG tablet Take 40 mg by mouth daily.  . metFORMIN (GLUCOPHAGE) 500 MG tablet Take 1,000 mg by mouth daily at 3 pm.  . metoprolol succinate (TOPROL-XL) 100 MG 24 hr tablet Take 50 mg by mouth 2 (two) times daily.  . Multiple Vitamin (MULTIVITAMIN WITH MINERALS) TABS tablet Take 1 tablet by mouth daily. Centrum Silver for Men 50+  . [DISCONTINUED] amLODipine (NORVASC) 5 MG tablet Take 5 mg by mouth daily.    Cardiac Studies:    Assessment:   Ectopic atrial tachycardia ([place]) - Plan: EKG 12-Lead, PCV ECHOCARDIOGRAM COMPLETE, PCV MYOCARDIAL PERFUSION WITH LEXISCAN  Primary hypertension  Mixed hyperlipidemia  Tobacco use  Preoperative cardiovascular examination  EKG 04/26/2019: Probable ectopic atrial tachycardia at 114 bpm, left axis deviation, right bundle branch block. Abnormal EKG.   Recommendations:   Patient is here for evaluation of tachycardia that has been unresponsive to metoprolol. EKG shows likely ectopic atrial tachycardia. Will add verapamil 240 mg daily, blood pressure is also elevated. I suspect ongoing  tobacco use and untreated sleep apnea is contributing. He is essentially asymptomatic; however, his functional capacity is limited due to his hip and is difficult to assess. In view of his risk factors, upcoming surgery, and EKG findings, will obtain lexiscan nuclear stress testing. Will also obtain echocardiogram to exclude any structural abnormalities. He reports lipids are well controlled. Lengthy discussion with the patient on the importance of quitting smoking and long term risk of continued tobacco use. He reports trying Chantix in the past without success. States that he is able to get assistance with this through his job and will further discuss.  Will try to have test performed in the next few weeks to not further delay his surgery. I will plan to see him back in 3 weeks for follow up with repeat EKG.   *I have discussed this case with Dr. Einar Gip and he personally examined the patient and participated in formulating the plan.*   Miquel Dunn, MSN, APRN, FNP-C [facility]. [place] Office: [phone] Fax: [phone]

## 2019-05-06 ENCOUNTER — Other Ambulatory Visit: Payer: Self-pay

## 2019-05-06 ENCOUNTER — Ambulatory Visit (INDEPENDENT_AMBULATORY_CARE_PROVIDER_SITE_OTHER): Payer: No Typology Code available for payment source

## 2019-05-06 DIAGNOSIS — I471 Supraventricular tachycardia: Secondary | ICD-10-CM | POA: Diagnosis not present

## 2019-05-10 ENCOUNTER — Ambulatory Visit (INDEPENDENT_AMBULATORY_CARE_PROVIDER_SITE_OTHER): Payer: No Typology Code available for payment source

## 2019-05-10 ENCOUNTER — Other Ambulatory Visit: Payer: Self-pay

## 2019-05-10 DIAGNOSIS — I471 Supraventricular tachycardia: Secondary | ICD-10-CM | POA: Diagnosis not present

## 2019-05-17 ENCOUNTER — Ambulatory Visit (INDEPENDENT_AMBULATORY_CARE_PROVIDER_SITE_OTHER): Payer: No Typology Code available for payment source | Admitting: Cardiology

## 2019-05-17 ENCOUNTER — Other Ambulatory Visit: Payer: Self-pay

## 2019-05-17 ENCOUNTER — Encounter: Payer: Self-pay | Admitting: Cardiology

## 2019-05-17 VITALS — BP 143/94 | HR 102 | Temp 95.5°F | Ht 68.0 in | Wt 207.6 lb

## 2019-05-17 DIAGNOSIS — I4719 Other supraventricular tachycardia: Secondary | ICD-10-CM

## 2019-05-17 DIAGNOSIS — Z01810 Encounter for preprocedural cardiovascular examination: Secondary | ICD-10-CM

## 2019-05-17 DIAGNOSIS — Z720 Tobacco use: Secondary | ICD-10-CM

## 2019-05-17 DIAGNOSIS — I471 Supraventricular tachycardia: Secondary | ICD-10-CM

## 2019-05-17 DIAGNOSIS — I10 Essential (primary) hypertension: Secondary | ICD-10-CM | POA: Diagnosis not present

## 2019-05-17 MED ORDER — VALSARTAN 320 MG PO TABS: 320.0000 mg | ORAL_TABLET | Freq: Every day | ORAL | 2 refills | Status: DC

## 2019-05-17 NOTE — Progress Notes (Signed)
Primary Physician:  Leanna Battles, MD   Patient ID: Gavin Hopkins, male    DOB: Jan 24, 1956, 63 y.o.   MRN: [id number]  Subjective:    Chief Complaint  Patient presents with  . Sinus Tachycardia  . Follow-up    3wk    HPI: Gavin Hopkins  is a 63 y.o. male  with hypertension, type 2 diabetes, OSA not on CPAP due to intolerance, current smoker, and hyperlipidemia, recently evaluated by [place] for atrial tachycardia that was unresponsive to metoprolol.  At his last office visit, he was started on verapamil and now presents for follow-up.  Patient reports that he is doing well. He lost his mother yesterday and is upset regarding this and very stressed about planning funeral. Patient denies any symptoms, no chest pain, dyspnea on exertion, palpitations, syncope, leg swelling, of symptoms suggestive of claudication or TIA.  He is currently out of work due to his left hip pain. He works as a Chemical engineer at [employer].   He is 3/4 a pack per day smoker, but has been able to slightly cut back. Drinks 2-3 mixed drinks per day. No illicit drug use.  Past Medical History:  Diagnosis Date  . Arthritis    oa  . Diabetes mellitus without complication ([place])    type 2  . Displaced fracture of lateral malleolus of right fibula, initial encounter for closed fracture 30 yrs ago   and tibula, full cast applied  . Hypertension   . Sleep apnea    could not tolerate cpap    Past Surgical History:  Procedure Laterality Date  . LEG SURGERY Right    over 30 yrs ago  . NO PAST SURGERIES      Social History   Socioeconomic History  . Marital status: Divorced    Spouse name: Not on file  . Number of children: 1  . Years of education: Not on file  . Highest education level: Not on file  Occupational History  . Not on file  Social Needs  . Financial resource strain: Not on file  . Food insecurity    Worry: Not on file    Inability: Not on file  . Transportation needs    Medical:  Not on file    Non-medical: Not on file  Tobacco Use  . Smoking status: Current Every Day Smoker    Packs/day: 0.75    Types: Cigarettes  . Smokeless tobacco: Never Used  Substance and Sexual Activity  . Alcohol use: Yes    Comment: 2 drinks per night  . Drug use: Never  . Sexual activity: Not on file  Lifestyle  . Physical activity    Days per week: Not on file    Minutes per session: Not on file  . Stress: Not on file  Relationships  . Social Herbalist on phone: Not on file    Gets together: Not on file    Attends religious service: Not on file    Active member of club or organization: Not on file    Attends meetings of clubs or organizations: Not on file    Relationship status: Not on file  . Intimate partner violence    Fear of current or ex partner: Not on file    Emotionally abused: Not on file    Physically abused: Not on file    Forced sexual activity: Not on file  Other Topics Concern  . Not on file  Social History Narrative  .  Not on file    Review of Systems  Constitution: Negative for decreased appetite, malaise/fatigue, weight gain and weight loss.  Eyes: Negative for visual disturbance.  Cardiovascular: Negative for chest pain, claudication, dyspnea on exertion, leg swelling, orthopnea, palpitations and syncope.  Respiratory: Negative for hemoptysis and wheezing.   Endocrine: Negative for cold intolerance and heat intolerance.  Hematologic/Lymphatic: Does not bruise/bleed easily.  Skin: Negative for nail changes.  Musculoskeletal: Positive for joint pain (left hip pain). Negative for muscle weakness and myalgias.  Gastrointestinal: Negative for abdominal pain, change in bowel habit, nausea and vomiting.  Neurological: Negative for difficulty with concentration, dizziness, focal weakness and headaches.  Psychiatric/Behavioral: Negative for altered mental status and suicidal ideas.  All other systems reviewed and are negative.     Objective:   Blood pressure (!) 143/94, pulse (!) 102, temperature (!) 95.5 F (35.3 C), height 5\' 8"  (1.727 m), weight 207 lb 9.6 oz (94.2 kg), SpO2 95 %. Body mass index is 31.57 kg/m.    Physical Exam  Constitutional: He is oriented to person, place, and time. Vital signs are normal. He appears well-developed and well-nourished.  HENT:  Head: Normocephalic and atraumatic.  Neck: Normal range of motion.  Cardiovascular: Normal rate, regular rhythm, normal heart sounds and intact distal pulses.  Pulses:      Femoral pulses are 2+ on the right side and 2+ on the left side.      Popliteal pulses are 2+ on the right side and 2+ on the left side.       Dorsalis pedis pulses are 2+ on the right side and 1+ on the left side.       Posterior tibial pulses are 1+ on the right side and 2+ on the left side.  Pulmonary/Chest: Effort normal and breath sounds normal. No accessory muscle usage. No respiratory distress.  Abdominal: Soft. Bowel sounds are normal.  Musculoskeletal: Normal range of motion.  Neurological: He is alert and oriented to person, place, and time.  Skin: Skin is warm and dry.  Vitals reviewed.  Radiology: No results found.  Laboratory examination:    CMP Latest Ref Rng & Units 03/29/2019  Glucose 70 - 99 mg/dL 121(H)  BUN 8 - 23 mg/dL 25(H)  Creatinine 0.61 - 1.24 mg/dL 1.36(H)  Sodium 135 - 145 mmol/L 139  Potassium 3.5 - 5.1 mmol/L 4.2  Chloride 98 - 111 mmol/L 104  CO2 22 - 32 mmol/L 27  Calcium 8.9 - 10.3 mg/dL 9.7  Total Protein 6.5 - 8.1 g/dL 8.3(H)  Total Bilirubin 0.3 - 1.2 mg/dL 0.5  Alkaline Phos 38 - 126 U/L 70  AST 15 - 41 U/L 37  ALT 0 - 44 U/L 58(H)   CBC Latest Ref Rng & Units 03/29/2019  WBC 4.0 - 10.5 K/uL 12.4(H)  Hemoglobin 13.0 - 17.0 g/dL 15.4  Hematocrit 39.0 - 52.0 % 46.3  Platelets 150 - 400 K/uL 279   Lipid Panel  No results found for: CHOL, TRIG, HDL, CHOLHDL, VLDL, LDLCALC, LDLDIRECT HEMOGLOBIN A1C Lab Results  Component Value Date   HGBA1C  6.3 (H) 03/29/2019   MPG 134.11 03/29/2019   TSH No results for input(s): TSH in the last 8760 hours.  PRN Meds:. Medications Discontinued During This Encounter  Medication Reason  . losartan (COZAAR) 100 MG tablet Discontinued by provider   Current Meds  Medication Sig  . Aromatic Inhalants (VICKS BABYRUB) OINT Apply 1 application topically. 3 times weekly at bedtime  . aspirin 325 MG  EC tablet Take 325 mg by mouth daily at 3 pm.  . cyclobenzaprine (FLEXERIL) 10 MG tablet Take 20 mg by mouth daily.  . hydrochlorothiazide (HYDRODIURIL) 12.5 MG tablet Take 12.5 mg by mouth daily.  Marland Kitchen HYDROcodone-acetaminophen (NORCO) 7.5-325 MG tablet Take 2 tablets by mouth daily.  Marland Kitchen lovastatin (MEVACOR) 40 MG tablet Take 40 mg by mouth daily.  . metFORMIN (GLUCOPHAGE) 500 MG tablet Take 1,000 mg by mouth daily at 3 pm.  . metoprolol succinate (TOPROL-XL) 100 MG 24 hr tablet Take 50 mg by mouth 2 (two) times daily.  . Multiple Vitamin (MULTIVITAMIN WITH MINERALS) TABS tablet Take 1 tablet by mouth daily. Centrum Silver for Men 50+  . verapamil (CALAN-SR) 240 MG CR tablet Take 1 tablet (240 mg total) by mouth at bedtime.  . [DISCONTINUED] losartan (COZAAR) 100 MG tablet Take 100 mg by mouth daily.    Cardiac Studies:   Echocardiogram 05/06/2019:  Normal LV systolic function with EF 55%. Left ventricle cavity is normal in size. Normal global wall motion. Doppler evidence of grade I (impaired) diastolic dysfunction, normal LAP. Calculated EF 55%. Left atrial cavity is normal in size. Lipomatous hypertrophy of the interatrial septum is present. No pericardial effusion. Anterior fat pad noted. The aortic root is mildly dilated at 3.9 cm.  [place] Stress Test 05/10/2019: 1. Resting EKG normal sinus rhythm, left axis deviation, left anterior fascicular block, right bundle branch block.  Low-voltage complexes.  Stress EKG nondiagnostic due to Lexiscan infusion. 2. Nuclear perfusion reveals  diaphragmatic attenuation in the inferior wall.  There is no demonstrable ischemia or infarct.  LVEF was 58% without wall motion abnormality.  This is a low risk stress test. No previous exam available for comparison.   Assessment:   Ectopic atrial tachycardia ([place]) - Plan: EKG 12-Lead  Primary hypertension  Tobacco use  Preoperative cardiovascular examination  EKG 05/17/2019: Normal sinus rhythm at 89 bpm, rightward axis, right bundle branch block. Abnormal EKG.  EKG 04/26/2019: Probable ectopic atrial tachycardia at 114 bpm, left axis deviation, right bundle branch block. Abnormal EKG.   Recommendations:   With the addition of verapamil, patient has had improvement in heart rate and EKG today shows normal sinus rhythm.  His blood pressure does continue to be elevated, I do suspect elevation today is related to stress with losing his mother yesterday.  I will change his losartan to valsartan.  I have discussed the importance of weight loss both for his upcoming surgery and for management of high blood pressure.  Discussed diet modifications to help with this.  I have discussed his recently obtained Lexiscan nuclear stress test that was without evidence of perfusion abnormalities, considered low risk study.  Echocardiogram shows mild blood pressure changes and mild aortic root dilation that I suspect is attributed by his weight.  Given his test result findings and improvement in his heart rate, see no contraindication for him to proceed with surgery.  I will send clearance letter to Dr. Reynaldo Minium so that this can be scheduled.  He has been working to quit smoking and has been able to cut back.  He will continue to work on this.  I will see him back in approximately 4 weeks to follow-up on his blood pressure.   Miquel Dunn, MSN, APRN, FNP-C [facility] Cardi[facility]. [place] Office: [phone] Fax: [phone]

## 2019-06-01 NOTE — Patient Instructions (Addendum)
DUE TO COVID-19 ONLY ONE VISITOR IS ALLOWED TO COME WITH YOU AND STAY IN THE WAITING ROOM ONLY DURING PRE OP AND PROCEDURE DAY OF SURGERY. THE 1 VISITOR MAY VISIT WITH YOU AFTER SURGERY IN YOUR PRIVATE ROOM DURING VISITING HOURS ONLY!  YOU NEED TO HAVE A COVID 19 TEST ON__11/5_____ @_2 :05 pm______, THIS TEST MUST BE DONE BEFORE SURGERY, COME  [address], [place] , [id number].  ([place]) ONCE YOUR COVID TEST IS COMPLETED, PLEASE BEGIN THE QUARANTINE INSTRUCTIONS AS OUTLINED IN YOUR HANDOUT.                Chen Coxe   Your procedure is scheduled on: 06/07/19   Report to [facility] Main  Entrance   Report to admitting at  1:00 PM     Call this number if you have problems the morning of surgery [place], NO [place].   Do not eat food After Midnight.   YOU MAY HAVE CLEAR LIQUIDS FROM MIDNIGHT UNTIL 12:30 PM    CLEAR LIQUID DIET   Foods Allowed                                                                     Foods Excluded  Coffee and tea, regular and decaf                             liquids that you cannot  Plain Jell-O any favor except red or purple                                           see through such as: Fruit ices (not with fruit pulp)                                     milk, soups, orange juice  Iced Popsicles                                    All solid food Carbonated beverages, regular and diet                                    Cranberry, grape and apple juices Sports drinks like Gatorade Lightly seasoned clear broth or consume(fat free) Sugar, honey syrup    . At 12:30 PM Please finish the prescribed Pre-Surgery Gatorade drink.   Nothing by mouth after you finish the Gatorade drink !    Take these medicines the morning of surgery with A SIP OF WATER: Norco if needed, Toprolol XL  DO NOT TAKE ANY DIABETIC MEDICATIONS DAY OF  YOUR SURGERY        How to Manage Your Diabetes Before and After Surgery  Why is it important to control my blood sugar before and after  surgery? . Improving blood sugar levels before and after surgery helps healing and can limit problems. . A way of improving blood sugar control is eating a healthy diet by: o  Eating less sugar and carbohydrates o  Increasing activity/exercise o  Talking with your doctor about reaching your blood sugar goals . High blood sugars (greater than 180 mg/dL) can raise your risk of infections and slow your recovery, so you will need to focus on controlling your diabetes during the weeks before surgery. . Make sure that the doctor who takes care of your diabetes knows about your planned surgery including the date and location.  How do I manage my blood sugar before surgery? . Check your blood sugar at least 4 times a day, starting 2 days before surgery, to make sure that the level is not too high or low. o Check your blood sugar the morning of your surgery when you wake up and every 2 hours until you get to the Short Stay unit. . If your blood sugar is less than 70 mg/dL, you will need to treat for low blood sugar: o Do not take insulin. o Treat a low blood sugar (less than 70 mg/dL) with  cup of clear juice (cranberry or apple), 4 glucose tablets, OR glucose gel. o Recheck blood sugar in 15 minutes after treatment (to make sure it is greater than 70 mg/dL). If your blood sugar is not greater than 70 mg/dL on recheck, call [phone] for further instructions. . Report your blood sugar to the short stay nurse when you get to Short Stay.  . If you are admitted to the hospital after surgery: o Your blood sugar will be checked by the staff and you will probably be given insulin after surgery (instead of oral diabetes medicines) to make sure you have good blood sugar levels. o The goal for blood sugar control after surgery is 80-180 mg/dL.   WHAT DO I DO ABOUT MY  DIABETES MEDICATION?  Marland Kitchen Do not take oral diabetes medicines (pills) the morning of surgery.  .                   You may not have any metal on your body including piercings             Do not wear jewelry,  lotions, powders or  deodorant                      Men may shave face and neck.   Do not bring valuables to the hospital. [place].  Contacts, dentures or bridgework may not be worn into surgery.       Special Instructions: N/A              Please read over the following fact sheets you were given: _____________________________________________________________________             [facility] - Preparing for Surgery  Before surgery, you can play an important role.   Because skin is not sterile, your skin needs to be as free of germs as possible.   You can reduce the number of germs on your skin by washing with CHG (chlorahexidine gluconate) soap before surgery.   CHG is an antiseptic cleaner which kills germs and bonds with the skin to continue killing germs even after washing. Please DO NOT use if you  have an allergy to CHG or antibacterial soaps.   If your skin becomes reddened/irritated stop using the CHG and inform your nurse when you arrive at Short Stay.   You may shave your face/neck.  Please follow these instructions carefully:  1.  Shower with CHG Soap the night before surgery and the  morning of Surgery.  2.  If you choose to wash your hair, wash your hair first as usual with your  normal  shampoo.  3.  After you shampoo, rinse your hair and body thoroughly to remove the  shampoo.                                       4.  Use CHG as you would any other liquid soap.  You can apply chg directly  to the skin and wash                       Gently with a scrungie or clean washcloth.  5.  Apply the CHG Soap to your body ONLY FROM THE NECK DOWN.   Do not use on face/ open                           Wound or open sores.  Avoid contact with eyes, ears mouth and genitals (private parts).                       Wash face,  Genitals (private parts) with your normal soap.             6.  Wash thoroughly, paying special attention to the area where your surgery  will be performed.  7.  Thoroughly rinse your body with warm water from the neck down.  8.  DO NOT shower/wash with your normal soap after using and rinsing off  the CHG Soap.             9.  Pat yourself dry with a clean towel.            10.  Wear clean pajamas.            11.  Place clean sheets on your bed the night of your first shower and do not  sleep with pets. Day of Surgery : Do not apply any lotions/deodorants the morning of surgery.  Please wear clean clothes to the hospital/surgery center.  FAILURE TO FOLLOW THESE INSTRUCTIONS MAY RESULT IN THE CANCELLATION OF YOUR SURGERY PATIENT SIGNATURE_________________________________  NURSE SIGNATURE__________________________________  ________________________________________________________________________   Adam Phenix  An incentive spirometer is a tool that can help keep your lungs clear and active. This tool measures how well you are filling your lungs with each breath. Taking long deep breaths may help reverse or decrease the chance of developing breathing (pulmonary) problems (especially infection) following:  A long period of time when you are unable to move or be active. BEFORE THE PROCEDURE   If the spirometer includes an indicator to show your best effort, your nurse or respiratory therapist will set it to a desired goal.  If possible, sit up straight or lean slightly forward. Try not to slouch.  Hold the incentive spirometer in an upright position. INSTRUCTIONS FOR USE  1. Sit on the edge of your bed if possible, or sit up as far as you can in bed  or on a chair. 2. Hold the incentive spirometer in an upright position. 3. Breathe out normally. 4. Place the mouthpiece in your  mouth and seal your lips tightly around it. 5. Breathe in slowly and as deeply as possible, raising the piston or the ball toward the top of the column. 6. Hold your breath for 3-5 seconds or for as long as possible. Allow the piston or ball to fall to the bottom of the column. 7. Remove the mouthpiece from your mouth and breathe out normally. 8. Rest for a few seconds and repeat Steps 1 through 7 at least 10 times every 1-2 hours when you are awake. Take your time and take a few normal breaths between deep breaths. 9. The spirometer may include an indicator to show your best effort. Use the indicator as a goal to work toward during each repetition. 10. After each set of 10 deep breaths, practice coughing to be sure your lungs are clear. If you have an incision (the cut made at the time of surgery), support your incision when coughing by placing a pillow or rolled up towels firmly against it. Once you are able to get out of bed, walk around indoors and cough well. You may stop using the incentive spirometer when instructed by your caregiver.  RISKS AND COMPLICATIONS  Take your time so you do not get dizzy or light-headed.  If you are in pain, you may need to take or ask for pain medication before doing incentive spirometry. It is harder to take a deep breath if you are having pain. AFTER USE  Rest and breathe slowly and easily.  It can be helpful to keep track of a log of your progress. Your caregiver can provide you with a simple table to help with this. If you are using the spirometer at home, follow these instructions: [place] IF:   You are having difficultly using the spirometer.  You have trouble using the spirometer as often as instructed.  Your pain medication is not giving enough relief while using the spirometer.  You develop fever of 100.5 F (38.1 C) or higher. SEEK IMMEDIATE MEDICAL CARE IF:   You cough up bloody sputum that had not been present before.  You  develop fever of 102 F (38.9 C) or greater.  You develop worsening pain at or near the incision site. MAKE SURE YOU:   Understand these instructions.  Will watch your condition.  Will get help right away if you are not doing well or get worse. Document Released: 11/25/2006 Document Revised: 10/07/2011 Document Reviewed: 01/26/2007 ExitCare Patient Information 2014 [employer], [place].   ________________________________________________________________________  WHAT IS A BLOOD TRANSFUSION? Blood Transfusion Information  A transfusion is the replacement of blood or some of its parts. Blood is made up of multiple cells which provide different functions.  Red blood cells carry oxygen and are used for blood loss replacement.  White blood cells fight against infection.  Platelets control bleeding.  Plasma helps clot blood.  Other blood products are available for specialized needs, such as hemophilia or other clotting disorders. BEFORE THE TRANSFUSION  Who gives blood for transfusions?   Healthy volunteers who are fully evaluated to make sure their blood is safe. This is blood bank blood. Transfusion therapy is the safest it has ever been in the practice of medicine. Before blood is taken from a donor, a complete history is taken to make sure that person has no history of diseases nor engages in risky  social behavior (examples are intravenous drug use or sexual activity with multiple partners). The donor's travel history is screened to minimize risk of transmitting infections, such as malaria. The donated blood is tested for signs of infectious diseases, such as HIV and hepatitis. The blood is then tested to be sure it is compatible with you in order to minimize the chance of a transfusion reaction. If you or a relative donates blood, this is often done in anticipation of surgery and is not appropriate for emergency situations. It takes many days to process the donated blood. RISKS AND  COMPLICATIONS Although transfusion therapy is very safe and saves many lives, the main dangers of transfusion include:   Getting an infectious disease.  Developing a transfusion reaction. This is an allergic reaction to something in the blood you were given. Every precaution is taken to prevent this. The decision to have a blood transfusion has been considered carefully by your caregiver before blood is given. Blood is not given unless the benefits outweigh the risks. AFTER THE TRANSFUSION  Right after receiving a blood transfusion, you will usually feel much better and more energetic. This is especially true if your red blood cells have gotten low (anemic). The transfusion raises the level of the red blood cells which carry oxygen, and this usually causes an energy increase.  The nurse administering the transfusion will monitor you carefully for complications. HOME CARE INSTRUCTIONS  No special instructions are needed after a transfusion. You may find your energy is better. Speak with your caregiver about any limitations on activity for underlying diseases you may have. SEEK MEDICAL CARE IF:   Your condition is not improving after your transfusion.  You develop redness or irritation at the intravenous (IV) site. SEEK IMMEDIATE MEDICAL CARE IF:  Any of the following symptoms occur over the next 12 hours:  Shaking chills.  You have a temperature by mouth above 102 F (38.9 C), not controlled by medicine.  Chest, back, or muscle pain.  People around you feel you are not acting correctly or are confused.  Shortness of breath or difficulty breathing.  Dizziness and fainting.  You get a rash or develop hives.  You have a decrease in urine output.  Your urine turns a dark color or changes to pink, red, or brown. Any of the following symptoms occur over the next 10 days:  You have a temperature by mouth above 102 F (38.9 C), not controlled by medicine.  Shortness of  breath.  Weakness after normal activity.  The white part of the eye turns yellow (jaundice).  You have a decrease in the amount of urine or are urinating less often.  Your urine turns a dark color or changes to pink, red, or brown. Document Released: 07/12/2000 Document Revised: 10/07/2011 Document Reviewed: 02/29/2008 [facility] Patient Information 2014 [place], [place].  _______________________________________________________________________

## 2019-06-02 ENCOUNTER — Encounter (HOSPITAL_COMMUNITY): Payer: Self-pay

## 2019-06-02 ENCOUNTER — Encounter (HOSPITAL_COMMUNITY)
Admission: RE | Admit: 2019-06-02 | Discharge: 2019-06-02 | Disposition: A | Discharge status: Home / Self Care | Payer: 59 | Source: Ambulatory Visit | Attending: Orthopedic Surgery | Admitting: Orthopedic Surgery

## 2019-06-02 ENCOUNTER — Other Ambulatory Visit: Payer: Self-pay

## 2019-06-02 DIAGNOSIS — M1611 Unilateral primary osteoarthritis, right hip: Secondary | ICD-10-CM | POA: Diagnosis not present

## 2019-06-02 DIAGNOSIS — Z01812 Encounter for preprocedural laboratory examination: Secondary | ICD-10-CM | POA: Insufficient documentation

## 2019-06-02 LAB — COMPREHENSIVE METABOLIC PANEL
ALT: 65 U/L — ABNORMAL HIGH (ref 0–44)
AST: 50 U/L — ABNORMAL HIGH (ref 15–41)
Albumin: 4.3 g/dL (ref 3.5–5.0)
Alkaline Phosphatase: 66 U/L (ref 38–126)
Anion gap: 9 (ref 5–15)
BUN: 29 mg/dL — ABNORMAL HIGH (ref 8–23)
CO2: 24 mmol/L (ref 22–32)
Calcium: 9.4 mg/dL (ref 8.9–10.3)
Chloride: 104 mmol/L (ref 98–111)
Creatinine, Ser: 1.29 mg/dL — ABNORMAL HIGH (ref 0.61–1.24)
GFR calc Af Amer: >60 mL/min (ref >60)
GFR calc non Af Amer: 58 mL/min — ABNORMAL LOW (ref >60)
Glucose, Bld: 100 mg/dL — ABNORMAL HIGH (ref 70–99)
Potassium: 4.8 mmol/L (ref 3.5–5.1)
Sodium: 137 mmol/L (ref 135–145)
Total Bilirubin: 0.8 mg/dL (ref 0.3–1.2)
Total Protein: 7.7 g/dL (ref 6.5–8.1)

## 2019-06-02 LAB — CBC
HCT: 45.7 % (ref 39.0–52.0)
Hemoglobin: 15.1 g/dL (ref 13.0–17.0)
MCH: 32.5 pg (ref 26.0–34.0)
MCHC: 33 g/dL (ref 30.0–36.0)
MCV: 98.5 fL (ref 80.0–100.0)
Platelets: 254 10*3/uL (ref 150–400)
RBC: 4.64 MIL/uL (ref 4.22–5.81)
RDW: 13.6 % (ref 11.5–15.5)
WBC: 12.7 10*3/uL — ABNORMAL HIGH (ref 4.0–10.5)
nRBC: 0 % (ref 0.0–0.2)

## 2019-06-02 LAB — SURGICAL PCR SCREEN
MRSA, PCR: NEGATIVE
Staphylococcus aureus: NEGATIVE

## 2019-06-02 LAB — PROTIME-INR
INR: 1 (ref 0.8–1.2)
Prothrombin Time: 12.7 seconds (ref 11.4–15.2)

## 2019-06-02 LAB — HEMOGLOBIN A1C
Hgb A1c MFr Bld: 6.3 % — ABNORMAL HIGH (ref 4.8–5.6)
Mean Plasma Glucose: 134.11 mg/dL

## 2019-06-02 LAB — GLUCOSE, CAPILLARY: Glucose-Capillary: 144 mg/dL — ABNORMAL HIGH (ref 70–99)

## 2019-06-02 LAB — APTT: aPTT: 29 seconds (ref 24–36)

## 2019-06-02 NOTE — Progress Notes (Signed)
PCP -Dr. Rogers Seeds  Cardiologist - Dr. Floyce Stakes  Chest x-ray - no EKG - 05/17/19 Stress Test - 05/10/19 ECHO - 05/06/19 Cardiac Cath - no  Sleep Study - yes CPAP - doesn't use a mask . He can't tolerate it  Fasting Blood Sugar - Pt doesn't know Checks Blood Sugar _____ times a day He doesn't test his sugar at all. His PCP monitors it by HgbA1c results. He hasn't seen his PCP in a while.  Blood Thinner Instructions:ASA Aspirin Instructions:Stop 5 days prior as instructed by Dr. Anne Fu office Last Dose:05/31/19  Anesthesia review:   Patient denies shortness of breath, fever, cough and chest pain at PAT appointment yes  Patient verbalized understanding of instructions that were given to them at the PAT appointment. Patient was also instructed that they will need to review over the PAT instructions again at home before surgery. yes

## 2019-06-03 ENCOUNTER — Other Ambulatory Visit (HOSPITAL_COMMUNITY)
Admission: RE | Admit: 2019-06-03 | Discharge: 2019-06-03 | Disposition: A | Discharge status: Home / Self Care | Payer: 59 | Source: Ambulatory Visit | Attending: Orthopedic Surgery | Admitting: Orthopedic Surgery

## 2019-06-03 DIAGNOSIS — Z20828 Contact with and (suspected) exposure to other viral communicable diseases: Secondary | ICD-10-CM | POA: Diagnosis not present

## 2019-06-03 DIAGNOSIS — Z01812 Encounter for preprocedural laboratory examination: Secondary | ICD-10-CM | POA: Insufficient documentation

## 2019-06-03 NOTE — H&P (Signed)
TOTAL HIP ADMISSION H&P  Patient is admitted for right total hip arthroplasty.  Subjective:  Chief Complaint: right hip pain  HPI: Gavin Hopkins, 63 y.o. male, has a history of pain and functional disability in the right hip(s) due to arthritis and patient has failed non-surgical conservative treatments for greater than 12 weeks to include activity modification.  Onset of symptoms was gradual starting 3 years ago with gradually worsening course since that time.The patient noted no past surgery on the right hip(s).  Patient currently rates pain in the right hip at 8 out of 10 with activity. Patient has worsening of pain with activity and weight bearing and pain that interfers with activities of daily living. 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 by imaging studies. This condition presents safety issues increasing the risk of falls. There is no current active infection.  There are no active problems to display for this patient.  Past Medical History:  Diagnosis Date   Arthritis    oa   Diabetes mellitus without complication ([place])    type 2   Displaced fracture of lateral malleolus of right fibula, initial encounter for closed fracture 30 yrs ago   and tibula, full cast applied   Hypertension    Sleep apnea    could not tolerate cpap    Past Surgical History:  Procedure Laterality Date   FRACTURE SURGERY     LEG SURGERY Right    over 30 yrs ago   NO PAST SURGERIES      No current facility-administered medications for this encounter.    Current Outpatient Medications  Medication Sig Dispense Refill Last Dose   Aromatic Inhalants (VICKS BABYRUB) OINT Apply 1 application topically at bedtime.       aspirin 325 MG EC tablet Take 325 mg by mouth daily at 3 pm.      cyclobenzaprine (FLEXERIL) 10 MG tablet Take 20 mg by mouth daily.       hydrochlorothiazide (HYDRODIURIL) 12.5 MG tablet Take 12.5 mg by mouth daily.      HYDROcodone-acetaminophen (NORCO) 7.5-325 MG tablet Take 2 tablets by mouth daily.      lovastatin (MEVACOR) 40 MG tablet Take 40 mg by mouth at bedtime.      metFORMIN (GLUCOPHAGE) 500 MG tablet Take 1,000 mg by mouth daily at 3 pm.      metoprolol succinate (TOPROL-XL) 100 MG 24 hr tablet Take 50 mg by mouth 2 (two) times daily.      Multiple Vitamin (MULTIVITAMIN WITH MINERALS) TABS tablet Take 1 tablet by mouth daily. Centrum Silver for Men 50+      valsartan (DIOVAN) 320 MG tablet Take 1 tablet (320 mg total) by mouth daily. 30 tablet 2    verapamil (CALAN-SR) 240 MG CR tablet Take 1 tablet (240 mg total) by mouth at bedtime. 30 tablet 1    No Known Allergies  Social History   Tobacco Use   Smoking status: Current Every Day Smoker    Packs/day: 0.75    Years: 15.00    Pack years: 11.25    Types: Cigarettes   Smokeless tobacco: Never Used  Substance Use Topics   Alcohol use: Yes    Comment: 2 drinks per night    Family History  Problem Relation Age of Onset   Hypertension Mother    Hyperlipidemia Mother    Diabetes Mother  Heart attack Mother        stent   Stroke Mother    Heart attack Brother    Diabetes Brother      Review of Systems  Constitutional: Negative for chills and fever.  Respiratory: Negative for cough and shortness of breath.   Cardiovascular: Negative for chest pain and palpitations.  Gastrointestinal: Negative for nausea and vomiting.  Musculoskeletal: Positive for joint pain.    Objective:  Physical Exam Well nourished and well developed. General: Alert and oriented x3, cooperative and pleasant, no acute distress. Head: normocephalic, atraumatic, neck supple. Eyes: EOMI. Respiratory: breath sounds clear in all fields, no wheezing, rales, or rhonchi. Cardiovascular: Regular rate and rhythm, no murmurs, gallops or rubs. Abdomen: non-tender to  palpation and soft, normoactive bowel sounds.  Musculoskeletal: Right Hip Exam: The range of motion: Flexion to 100 degrees, Internal Rotation 0 degrees, External Rotation to 10 degrees, and abduction to 10-20 degrees without pain during flexion and internal rotation. There is no tenderness over the greater trochanter bursa.  Calves soft and nontender. Motor function intact in LE. Strength 5/5 LE bilaterally. Neuro: Distal pulses 2+. Sensation to light touch intact in LE.   Vital signs in last 24 hours: Temp:  [99.3 F (37.4 C)] 99.3 F (37.4 C) (11/04 1305) Pulse Rate:  [98] 98 (11/04 1305) Resp:  [18] 18 (11/04 1305) BP: (143)/(87) 143/87 (11/04 1305) SpO2:  [97 %] 97 % (11/04 1305) Weight:  [93 kg] 93 kg (11/04 1305)  Labs:   Estimated body mass index is 31.19 kg/m as calculated from the following:   Height as of 06/02/19: 5\' 8"  (1.727 m).   Weight as of 06/02/19: 93 kg.   Imaging Review Plain radiographs demonstrate severe degenerative joint disease of the right hip(s). The bone quality appears to be adequate for age and reported activity level.  Assessment/Plan:  End stage arthritis, right hip(s)  The patient history, physical examination, clinical judgement of the provider and imaging studies are consistent with end stage degenerative joint disease of the right hip(s) and total hip arthroplasty is deemed medically necessary. The treatment options including medical management, injection therapy, arthroscopy and arthroplasty were discussed at length. The risks and benefits of total hip arthroplasty were presented and reviewed. The risks due to aseptic loosening, infection, stiffness, dislocation/subluxation,  thromboembolic complications and other imponderables were discussed.  The patient acknowledged the explanation, agreed to proceed with the plan and consent was signed. Patient is being admitted for inpatient treatment for surgery, pain control, PT, OT, prophylactic  antibiotics, VTE prophylaxis, progressive ambulation and ADL's and discharge planning.The patient is planning to be discharged home.   Therapy Plans: HEP Disposition: Home with son for first two nights, then neighbor available Planned DVT Prophylaxis: Aspirin 325 mg BID DME needed: Gilford Rile PCP: Leanna Battles, MD, clearance received Cardiology: Jeri Lager, NP, clearance received TXA: IV Allergies: NKDA Anesthesia Concerns: None BMI: 31.1 Last HgbA1c: 6.3%  Other: Anesthesia cancelled surgery last time due to elevated heart rate. Had an echo and stress test at Cardiology, which were normal. Pt is a 3/4 ppd smoker, discussed cutting back 2 weeks prior to surgery.  Currently takes 325 mg ASA QD  Post-operative pain management will be complicated by chronic opioid use. Takes two Norco 7.5-325 QD.  - Patient was instructed on what medications to stop prior to surgery. - Follow-up visit in 2 weeks with Dr. Wynelle Link - Begin physical therapy following surgery - Pre-operative lab work as pre-surgical testing - Prescriptions will  be provided in hospital at time of discharge  Griffith Citron, PA-C Orthopedic Surgery [facility] [place] [phone]

## 2019-06-04 LAB — NOVEL CORONAVIRUS, NAA (HOSP ORDER, SEND-OUT TO REF LAB; TAT 18-24 HRS): SARS-CoV-2, NAA: NOT DETECTED

## 2019-06-07 ENCOUNTER — Other Ambulatory Visit: Payer: Self-pay

## 2019-06-07 ENCOUNTER — Encounter (HOSPITAL_COMMUNITY)
Admission: RE | Disposition: A | Discharge status: Home / Self Care | Payer: Self-pay | Source: Home / Self Care | Attending: Orthopedic Surgery

## 2019-06-07 ENCOUNTER — Inpatient Hospital Stay (HOSPITAL_COMMUNITY): Payer: No Typology Code available for payment source

## 2019-06-07 ENCOUNTER — Inpatient Hospital Stay (HOSPITAL_COMMUNITY)
Admission: RE | Admit: 2019-06-07 | Discharge: 2019-06-08 | DRG: 470 | Disposition: A | Discharge status: Home / Self Care | Payer: No Typology Code available for payment source | Attending: Orthopedic Surgery | Admitting: Orthopedic Surgery

## 2019-06-07 ENCOUNTER — Inpatient Hospital Stay (HOSPITAL_COMMUNITY): Payer: No Typology Code available for payment source | Admitting: Anesthesiology

## 2019-06-07 ENCOUNTER — Encounter (HOSPITAL_COMMUNITY): Payer: Self-pay | Admitting: Anesthesiology

## 2019-06-07 DIAGNOSIS — Z79899 Other long term (current) drug therapy: Secondary | ICD-10-CM

## 2019-06-07 DIAGNOSIS — Z8249 Family history of ischemic heart disease and other diseases of the circulatory system: Secondary | ICD-10-CM

## 2019-06-07 DIAGNOSIS — Z7982 Long term (current) use of aspirin: Secondary | ICD-10-CM | POA: Diagnosis not present

## 2019-06-07 DIAGNOSIS — Z6831 Body mass index (BMI) 31.0-31.9, adult: Secondary | ICD-10-CM

## 2019-06-07 DIAGNOSIS — M25751 Osteophyte, right hip: Secondary | ICD-10-CM | POA: Diagnosis present

## 2019-06-07 DIAGNOSIS — Z8349 Family history of other endocrine, nutritional and metabolic diseases: Secondary | ICD-10-CM

## 2019-06-07 DIAGNOSIS — Z7984 Long term (current) use of oral hypoglycemic drugs: Secondary | ICD-10-CM | POA: Diagnosis not present

## 2019-06-07 DIAGNOSIS — G4730 Sleep apnea, unspecified: Secondary | ICD-10-CM | POA: Diagnosis present

## 2019-06-07 DIAGNOSIS — Z419 Encounter for procedure for purposes other than remedying health state, unspecified: Secondary | ICD-10-CM

## 2019-06-07 DIAGNOSIS — F17210 Nicotine dependence, cigarettes, uncomplicated: Secondary | ICD-10-CM | POA: Diagnosis present

## 2019-06-07 DIAGNOSIS — E119 Type 2 diabetes mellitus without complications: Secondary | ICD-10-CM | POA: Diagnosis present

## 2019-06-07 DIAGNOSIS — M1611 Unilateral primary osteoarthritis, right hip: Secondary | ICD-10-CM

## 2019-06-07 DIAGNOSIS — I10 Essential (primary) hypertension: Secondary | ICD-10-CM | POA: Diagnosis present

## 2019-06-07 DIAGNOSIS — M1612 Unilateral primary osteoarthritis, left hip: Secondary | ICD-10-CM | POA: Diagnosis present

## 2019-06-07 DIAGNOSIS — E669 Obesity, unspecified: Secondary | ICD-10-CM | POA: Diagnosis present

## 2019-06-07 DIAGNOSIS — M169 Osteoarthritis of hip, unspecified: Secondary | ICD-10-CM | POA: Diagnosis present

## 2019-06-07 DIAGNOSIS — Z833 Family history of diabetes mellitus: Secondary | ICD-10-CM

## 2019-06-07 DIAGNOSIS — Z823 Family history of stroke: Secondary | ICD-10-CM

## 2019-06-07 DIAGNOSIS — Z96649 Presence of unspecified artificial hip joint: Secondary | ICD-10-CM

## 2019-06-07 HISTORY — PX: TOTAL HIP ARTHROPLASTY: SHX124

## 2019-06-07 LAB — GLUCOSE, CAPILLARY
Glucose-Capillary: 150 mg/dL — ABNORMAL HIGH (ref 70–99)
Glucose-Capillary: 104 mg/dL — ABNORMAL HIGH (ref 70–99)
Glucose-Capillary: 178 mg/dL — ABNORMAL HIGH (ref 70–99)

## 2019-06-07 LAB — TYPE AND SCREEN
ABO/RH(D): A POS
Antibody Screen: NEGATIVE

## 2019-06-07 SURGERY — ARTHROPLASTY, HIP, TOTAL, ANTERIOR APPROACH
Anesthesia: Spinal | Site: Hip | Laterality: Right

## 2019-06-07 MED ORDER — PROMETHAZINE HCL 25 MG/ML IJ SOLN: 6.2500 mg | INTRAMUSCULAR | Status: DC | PRN

## 2019-06-07 MED ORDER — ASPIRIN EC 325 MG PO TBEC
325.0000 mg | DELAYED_RELEASE_TABLET | Freq: Two times a day (BID) | ORAL | Status: DC
  Administered 2019-06-08: 08:00:00 325 mg via ORAL
  Filled 2019-06-07: qty 1

## 2019-06-07 MED ORDER — PHENYLEPHRINE HCL-NACL 10-0.9 MG/250ML-% IV SOLN
INTRAVENOUS | Status: DC | PRN
  Administered 2019-06-07: 25 ug/min via INTRAVENOUS

## 2019-06-07 MED ORDER — MEPERIDINE HCL 50 MG/ML IJ SOLN: 6.2500 mg | INTRAMUSCULAR | Status: DC | PRN

## 2019-06-07 MED ORDER — DIPHENHYDRAMINE HCL 12.5 MG/5ML PO ELIX: 12.5000 mg | ORAL_SOLUTION | ORAL | Status: DC | PRN

## 2019-06-07 MED ORDER — INSULIN ASPART 100 UNIT/ML ~~LOC~~ SOLN
0.0000 [IU] | Freq: Three times a day (TID) | SUBCUTANEOUS | Status: DC
  Administered 2019-06-08: 10:00:00 3 [IU] via SUBCUTANEOUS
  Administered 2019-06-08: 5 [IU] via SUBCUTANEOUS

## 2019-06-07 MED ORDER — ACETAMINOPHEN 500 MG PO TABS
500.0000 mg | ORAL_TABLET | Freq: Four times a day (QID) | ORAL | Status: DC
  Administered 2019-06-07 – 2019-06-08 (×2): 500 mg via ORAL
  Filled 2019-06-07 (×2): qty 1

## 2019-06-07 MED ORDER — PROPOFOL 10 MG/ML IV BOLUS
INTRAVENOUS | Status: AC
  Filled 2019-06-07: qty 20

## 2019-06-07 MED ORDER — LACTATED RINGERS IV SOLN: INTRAVENOUS | Status: DC

## 2019-06-07 MED ORDER — DOCUSATE SODIUM 100 MG PO CAPS
100.0000 mg | ORAL_CAPSULE | Freq: Two times a day (BID) | ORAL | Status: DC
  Administered 2019-06-07 – 2019-06-08 (×2): 100 mg via ORAL
  Filled 2019-06-07 (×2): qty 1

## 2019-06-07 MED ORDER — BISACODYL 10 MG RE SUPP: 10.0000 mg | Freq: Every day | RECTAL | Status: DC | PRN

## 2019-06-07 MED ORDER — METOPROLOL SUCCINATE ER 50 MG PO TB24
50.0000 mg | ORAL_TABLET | Freq: Two times a day (BID) | ORAL | Status: DC
  Administered 2019-06-07 – 2019-06-08 (×2): 50 mg via ORAL
  Filled 2019-06-07 (×2): qty 1

## 2019-06-07 MED ORDER — METHOCARBAMOL 500 MG PO TABS
500.0000 mg | ORAL_TABLET | Freq: Four times a day (QID) | ORAL | Status: DC | PRN
  Administered 2019-06-07: 18:00:00 500 mg via ORAL
  Filled 2019-06-07: qty 1

## 2019-06-07 MED ORDER — PROPOFOL 500 MG/50ML IV EMUL
INTRAVENOUS | Status: AC
  Filled 2019-06-07: qty 50

## 2019-06-07 MED ORDER — DEXAMETHASONE SODIUM PHOSPHATE 10 MG/ML IJ SOLN
INTRAMUSCULAR | Status: DC | PRN
  Administered 2019-06-07: 7 mg via INTRAVENOUS

## 2019-06-07 MED ORDER — PROPOFOL 10 MG/ML IV BOLUS
INTRAVENOUS | Status: DC | PRN
  Administered 2019-06-07 (×3): 20 mg via INTRAVENOUS

## 2019-06-07 MED ORDER — HYDROCHLOROTHIAZIDE 25 MG PO TABS
12.5000 mg | ORAL_TABLET | Freq: Every day | ORAL | Status: DC
  Administered 2019-06-08: 10:00:00 12.5 mg via ORAL
  Filled 2019-06-07: qty 1

## 2019-06-07 MED ORDER — TRANEXAMIC ACID-NACL 1000-0.7 MG/100ML-% IV SOLN
1000.0000 mg | INTRAVENOUS | Status: AC
  Administered 2019-06-07: 1000 mg via INTRAVENOUS
  Filled 2019-06-07: qty 100

## 2019-06-07 MED ORDER — VERAPAMIL HCL ER 240 MG PO TBCR
240.0000 mg | EXTENDED_RELEASE_TABLET | Freq: Every day | ORAL | Status: DC
  Administered 2019-06-07: 240 mg via ORAL
  Filled 2019-06-07 (×2): qty 1

## 2019-06-07 MED ORDER — BUPIVACAINE HCL (PF) 0.25 % IJ SOLN
INTRAMUSCULAR | Status: AC
  Filled 2019-06-07: qty 30

## 2019-06-07 MED ORDER — HYDROCODONE-ACETAMINOPHEN 5-325 MG PO TABS
1.0000 | ORAL_TABLET | ORAL | Status: DC | PRN
  Administered 2019-06-07 (×2): 1 via ORAL
  Filled 2019-06-07 (×2): qty 1

## 2019-06-07 MED ORDER — LIDOCAINE 2% (20 MG/ML) 5 ML SYRINGE
INTRAMUSCULAR | Status: DC | PRN
  Administered 2019-06-07: 60 mg via INTRAVENOUS

## 2019-06-07 MED ORDER — ACETAMINOPHEN 160 MG/5ML PO SOLN: 325.0000 mg | Freq: Once | ORAL | Status: DC | PRN

## 2019-06-07 MED ORDER — SODIUM CHLORIDE 0.9 % IV SOLN
INTRAVENOUS | Status: DC
  Administered 2019-06-07: 18:00:00 via INTRAVENOUS

## 2019-06-07 MED ORDER — FLEET ENEMA 7-19 GM/118ML RE ENEM: 1.0000 | ENEMA | Freq: Once | RECTAL | Status: DC | PRN

## 2019-06-07 MED ORDER — ACETAMINOPHEN 10 MG/ML IV SOLN: 1000.0000 mg | Freq: Once | INTRAVENOUS | Status: DC | PRN

## 2019-06-07 MED ORDER — CHLORHEXIDINE GLUCONATE 4 % EX LIQD: 60.0000 mL | Freq: Once | CUTANEOUS | Status: DC

## 2019-06-07 MED ORDER — ONDANSETRON HCL 4 MG/2ML IJ SOLN
INTRAMUSCULAR | Status: DC | PRN
  Administered 2019-06-07: 4 mg via INTRAVENOUS

## 2019-06-07 MED ORDER — MIDAZOLAM HCL 2 MG/2ML IJ SOLN
INTRAMUSCULAR | Status: DC | PRN
  Administered 2019-06-07: 2 mg via INTRAVENOUS

## 2019-06-07 MED ORDER — POVIDONE-IODINE 10 % EX SWAB
2.0000 "application " | Freq: Once | CUTANEOUS | Status: AC
  Administered 2019-06-07: 2 via TOPICAL

## 2019-06-07 MED ORDER — ONDANSETRON HCL 4 MG/2ML IJ SOLN: 4.0000 mg | Freq: Four times a day (QID) | INTRAMUSCULAR | Status: DC | PRN

## 2019-06-07 MED ORDER — ONDANSETRON HCL 4 MG PO TABS: 4.0000 mg | ORAL_TABLET | Freq: Four times a day (QID) | ORAL | Status: DC | PRN

## 2019-06-07 MED ORDER — 0.9 % SODIUM CHLORIDE (POUR BTL) OPTIME
TOPICAL | Status: DC | PRN
  Administered 2019-06-07: 16:00:00 1000 mL

## 2019-06-07 MED ORDER — BUPIVACAINE HCL 0.25 % IJ SOLN
INTRAMUSCULAR | Status: DC | PRN
  Administered 2019-06-07: 30 mL

## 2019-06-07 MED ORDER — CEFAZOLIN SODIUM-DEXTROSE 2-4 GM/100ML-% IV SOLN
2.0000 g | INTRAVENOUS | Status: AC
  Administered 2019-06-07: 2 g via INTRAVENOUS
  Filled 2019-06-07: qty 100

## 2019-06-07 MED ORDER — IRBESARTAN 150 MG PO TABS
300.0000 mg | ORAL_TABLET | Freq: Every day | ORAL | Status: DC
  Administered 2019-06-08: 300 mg via ORAL
  Filled 2019-06-07: qty 2

## 2019-06-07 MED ORDER — EPHEDRINE 5 MG/ML INJ
INTRAVENOUS | Status: AC
  Filled 2019-06-07: qty 10

## 2019-06-07 MED ORDER — MENTHOL 3 MG MT LOZG: 1.0000 | LOZENGE | OROMUCOSAL | Status: DC | PRN

## 2019-06-07 MED ORDER — ALBUMIN HUMAN 5 % IV SOLN
INTRAVENOUS | Status: AC
  Filled 2019-06-07: qty 250

## 2019-06-07 MED ORDER — CEFAZOLIN SODIUM-DEXTROSE 2-4 GM/100ML-% IV SOLN
2.0000 g | Freq: Four times a day (QID) | INTRAVENOUS | Status: AC
  Administered 2019-06-07 – 2019-06-08 (×2): 2 g via INTRAVENOUS
  Filled 2019-06-07 (×2): qty 100

## 2019-06-07 MED ORDER — FENTANYL CITRATE (PF) 100 MCG/2ML IJ SOLN
INTRAMUSCULAR | Status: AC
  Filled 2019-06-07: qty 2

## 2019-06-07 MED ORDER — PHENOL 1.4 % MT LIQD: 1.0000 | OROMUCOSAL | Status: DC | PRN

## 2019-06-07 MED ORDER — METOCLOPRAMIDE HCL 5 MG/ML IJ SOLN: 5.0000 mg | Freq: Three times a day (TID) | INTRAMUSCULAR | Status: DC | PRN

## 2019-06-07 MED ORDER — FENTANYL CITRATE (PF) 100 MCG/2ML IJ SOLN
INTRAMUSCULAR | Status: DC | PRN
  Administered 2019-06-07: 50 ug via INTRAVENOUS

## 2019-06-07 MED ORDER — PROPOFOL 500 MG/50ML IV EMUL
INTRAVENOUS | Status: DC | PRN
  Administered 2019-06-07: 50 ug/kg/min via INTRAVENOUS

## 2019-06-07 MED ORDER — PHENYLEPHRINE 40 MCG/ML (10ML) SYRINGE FOR IV PUSH (FOR BLOOD PRESSURE SUPPORT)
PREFILLED_SYRINGE | INTRAVENOUS | Status: AC
  Filled 2019-06-07: qty 30

## 2019-06-07 MED ORDER — HYDROCODONE-ACETAMINOPHEN 7.5-325 MG PO TABS
1.0000 | ORAL_TABLET | ORAL | Status: DC | PRN
  Administered 2019-06-07 – 2019-06-08 (×4): 2 via ORAL
  Filled 2019-06-07 (×4): qty 2

## 2019-06-07 MED ORDER — EPHEDRINE SULFATE-NACL 50-0.9 MG/10ML-% IV SOSY
PREFILLED_SYRINGE | INTRAVENOUS | Status: DC | PRN
  Administered 2019-06-07: 5 mg via INTRAVENOUS

## 2019-06-07 MED ORDER — ACETAMINOPHEN 325 MG PO TABS: 325.0000 mg | ORAL_TABLET | Freq: Once | ORAL | Status: DC | PRN

## 2019-06-07 MED ORDER — DEXAMETHASONE SODIUM PHOSPHATE 10 MG/ML IJ SOLN: 8.0000 mg | Freq: Once | INTRAMUSCULAR | Status: DC

## 2019-06-07 MED ORDER — DEXAMETHASONE SODIUM PHOSPHATE 10 MG/ML IJ SOLN
10.0000 mg | Freq: Once | INTRAMUSCULAR | Status: AC
  Administered 2019-06-08: 10 mg via INTRAVENOUS
  Filled 2019-06-07: qty 1

## 2019-06-07 MED ORDER — METHOCARBAMOL 500 MG IVPB - SIMPLE MED
500.0000 mg | Freq: Four times a day (QID) | INTRAVENOUS | Status: DC | PRN
  Filled 2019-06-07: qty 50

## 2019-06-07 MED ORDER — MIDAZOLAM HCL 2 MG/2ML IJ SOLN
INTRAMUSCULAR | Status: AC
  Filled 2019-06-07: qty 2

## 2019-06-07 MED ORDER — LIDOCAINE 2% (20 MG/ML) 5 ML SYRINGE
INTRAMUSCULAR | Status: AC
  Filled 2019-06-07: qty 5

## 2019-06-07 MED ORDER — BUPIVACAINE IN DEXTROSE 0.75-8.25 % IT SOLN
INTRATHECAL | Status: DC | PRN
  Administered 2019-06-07: 1.6 mL via INTRATHECAL

## 2019-06-07 MED ORDER — MORPHINE SULFATE (PF) 2 MG/ML IV SOLN: 0.5000 mg | INTRAVENOUS | Status: DC | PRN

## 2019-06-07 MED ORDER — PHENYLEPHRINE 40 MCG/ML (10ML) SYRINGE FOR IV PUSH (FOR BLOOD PRESSURE SUPPORT)
PREFILLED_SYRINGE | INTRAVENOUS | Status: DC | PRN
  Administered 2019-06-07 (×3): 80 ug via INTRAVENOUS

## 2019-06-07 MED ORDER — METOCLOPRAMIDE HCL 5 MG PO TABS: 5.0000 mg | ORAL_TABLET | Freq: Three times a day (TID) | ORAL | Status: DC | PRN

## 2019-06-07 MED ORDER — PRAVASTATIN SODIUM 20 MG PO TABS
40.0000 mg | ORAL_TABLET | Freq: Every day | ORAL | Status: DC
  Administered 2019-06-07: 40 mg via ORAL
  Filled 2019-06-07: qty 2

## 2019-06-07 MED ORDER — HYDROMORPHONE HCL 1 MG/ML IJ SOLN: 0.2500 mg | INTRAMUSCULAR | Status: DC | PRN

## 2019-06-07 MED ORDER — DEXAMETHASONE SODIUM PHOSPHATE 10 MG/ML IJ SOLN
INTRAMUSCULAR | Status: AC
  Filled 2019-06-07: qty 1

## 2019-06-07 MED ORDER — POLYETHYLENE GLYCOL 3350 17 G PO PACK: 17.0000 g | PACK | Freq: Every day | ORAL | Status: DC | PRN

## 2019-06-07 MED ORDER — ACETAMINOPHEN 10 MG/ML IV SOLN
1000.0000 mg | Freq: Four times a day (QID) | INTRAVENOUS | Status: DC
  Administered 2019-06-07: 1000 mg via INTRAVENOUS
  Filled 2019-06-07: qty 100

## 2019-06-07 MED ORDER — LACTATED RINGERS IV SOLN
INTRAVENOUS | Status: DC
  Administered 2019-06-07 (×2): via INTRAVENOUS

## 2019-06-07 MED ORDER — ONDANSETRON HCL 4 MG/2ML IJ SOLN
INTRAMUSCULAR | Status: AC
  Filled 2019-06-07: qty 2

## 2019-06-07 SURGICAL SUPPLY — 49 items
BAG DECANTER FOR FLEXI CONT (MISCELLANEOUS) IMPLANT
BAG SPEC THK2 15X12 ZIP CLS (MISCELLANEOUS)
BAG ZIPLOCK 12X15 (MISCELLANEOUS) IMPLANT
BALL HIP CERAMIC (Hips) IMPLANT
BLADE SAG 18X100X1.27 (BLADE) ×3 IMPLANT
CLOSURE WOUND 1/2 X4 (GAUZE/BANDAGES/DRESSINGS) ×1
COVER PERINEAL POST (MISCELLANEOUS) ×3 IMPLANT
COVER SURGICAL LIGHT HANDLE (MISCELLANEOUS) ×3 IMPLANT
COVER WAND RF STERILE (DRAPES) IMPLANT
CUP ACETBLR 52 OD PINNACLE (Hips) ×2 IMPLANT
DECANTER SPIKE VIAL GLASS SM (MISCELLANEOUS) ×3 IMPLANT
DRAPE STERI IOBAN 125X83 (DRAPES) ×3 IMPLANT
DRAPE U-SHAPE 47X51 STRL (DRAPES) ×6 IMPLANT
DRSG ADAPTIC 3X8 NADH LF (GAUZE/BANDAGES/DRESSINGS) ×3 IMPLANT
DRSG MEPILEX BORDER 4X4 (GAUZE/BANDAGES/DRESSINGS) ×3 IMPLANT
DRSG MEPILEX BORDER 4X8 (GAUZE/BANDAGES/DRESSINGS) ×3 IMPLANT
DURAPREP 26ML APPLICATOR (WOUND CARE) ×3 IMPLANT
ELECT REM PT RETURN 15FT ADLT (MISCELLANEOUS) ×3 IMPLANT
EVACUATOR 1/8 PVC DRAIN (DRAIN) ×3 IMPLANT
GLOVE BIO SURGEON STRL SZ 6 (GLOVE) IMPLANT
GLOVE BIO SURGEON STRL SZ7 (GLOVE) IMPLANT
GLOVE BIO SURGEON STRL SZ8 (GLOVE) ×3 IMPLANT
GLOVE BIOGEL PI IND STRL 6.5 (GLOVE) IMPLANT
GLOVE BIOGEL PI IND STRL 7.0 (GLOVE) IMPLANT
GLOVE BIOGEL PI IND STRL 8 (GLOVE) ×1 IMPLANT
GLOVE BIOGEL PI INDICATOR 6.5 (GLOVE)
GLOVE BIOGEL PI INDICATOR 7.0 (GLOVE)
GLOVE BIOGEL PI INDICATOR 8 (GLOVE) ×2
GOWN STRL REUS W/TWL LRG LVL3 (GOWN DISPOSABLE) ×3 IMPLANT
GOWN STRL REUS W/TWL XL LVL3 (GOWN DISPOSABLE) IMPLANT
HIP BALL CERAMIC (Hips) ×3 IMPLANT
HOLDER FOLEY CATH W/STRAP (MISCELLANEOUS) ×3 IMPLANT
KIT TURNOVER KIT A (KITS) IMPLANT
LINER MARATHON NEUT +4X52X32 (Hips) ×2 IMPLANT
MANIFOLD NEPTUNE II (INSTRUMENTS) ×3 IMPLANT
PACK ANTERIOR HIP CUSTOM (KITS) ×3 IMPLANT
SLEEVE SUCTION CATH 165 (SLEEVE) ×2 IMPLANT
STEM FEMORAL SZ6 HIGH ACTIS (Stem) ×2 IMPLANT
STRIP CLOSURE SKIN 1/2X4 (GAUZE/BANDAGES/DRESSINGS) ×2 IMPLANT
SUT ETHIBOND NAB CT1 #1 30IN (SUTURE) ×3 IMPLANT
SUT MNCRL AB 4-0 PS2 18 (SUTURE) ×3 IMPLANT
SUT STRATAFIX 0 PDS 27 VIOLET (SUTURE) ×3
SUT VIC AB 2-0 CT1 27 (SUTURE) ×6
SUT VIC AB 2-0 CT1 TAPERPNT 27 (SUTURE) ×2 IMPLANT
SUTURE STRATFX 0 PDS 27 VIOLET (SUTURE) ×1 IMPLANT
SYR 50ML LL SCALE MARK (SYRINGE) IMPLANT
SYR BULB IRRIGATION 50ML (SYRINGE) ×2 IMPLANT
TRAY FOLEY MTR SLVR 16FR STAT (SET/KITS/TRAYS/PACK) ×3 IMPLANT
YANKAUER SUCT BULB TIP 10FT TU (MISCELLANEOUS) ×3 IMPLANT

## 2019-06-07 NOTE — Interval H&P Note (Signed)
History and Physical Interval Note:  06/07/2019 2:08 PM  Gavin Hopkins  has presented today for surgery, with the diagnosis of right hip osteoarthritis.  The various methods of treatment have been discussed with the patient and family. After consideration of risks, benefits and other options for treatment, the patient has consented to  Procedure(s) with comments: Abie (Right) - 128min as a surgical intervention.  The patient's history has been reviewed, patient examined, no change in status, stable for surgery.  I have reviewed the patient's chart and labs.  Questions were answered to the patient's satisfaction.     Pilar Plate Jadie Comas

## 2019-06-07 NOTE — Transfer of Care (Signed)
Immediate Anesthesia Transfer of Care Note  Patient: Gavin Hopkins  Procedure(s) Performed: TOTAL HIP ARTHROPLASTY ANTERIOR APPROACH (Right Hip)  Patient Location: PACU  Anesthesia Type:MAC and Spinal  Level of Consciousness: awake, alert , oriented and patient cooperative  Airway & Oxygen Therapy: Patient Spontanous Breathing and Patient connected to face mask oxygen  Post-op Assessment: Report given to RN and Post -op Vital signs reviewed and stable  Post vital signs: Reviewed and stable  Last Vitals:  Vitals Value Taken Time  BP 141/110 06/07/19 1645  Temp 36.3 C 06/07/19 1645  Pulse 99 06/07/19 1647  Resp 24 06/07/19 1647  SpO2 99 % 06/07/19 1647  Vitals shown include unvalidated device data.  Last Pain:  Vitals:   06/07/19 1645  TempSrc:   PainSc: (P) 0-No pain      Patients Stated Pain Goal: 4 (37/48/27 0786)  Complications: No apparent anesthesia complications

## 2019-06-07 NOTE — Op Note (Signed)
OPERATIVE REPORT- TOTAL HIP ARTHROPLASTY   PREOPERATIVE DIAGNOSIS: Osteoarthritis of the Right hip.   POSTOPERATIVE DIAGNOSIS: Osteoarthritis of the Right  hip.   PROCEDURE: Right total hip arthroplasty, anterior approach.   SURGEON: Gaynelle Arabian, MD   ASSISTANT: Molli Barrows, PA-C  ANESTHESIA:  Spinal  ESTIMATED BLOOD LOSS:-250 mL    DRAINS: Hemovac x1.   COMPLICATIONS: None   CONDITION: PACU - hemodynamically stable.   BRIEF CLINICAL NOTE: Gavin Hopkins is a 63 y.o. male who has advanced end-  stage arthritis of their Right  hip with progressively worsening pain and  dysfunction.The patient has failed nonoperative management and presents for  total hip arthroplasty.   PROCEDURE IN DETAIL: After successful administration of spinal  anesthetic, the traction boots for the [facility] bed were placed on both  feet and the patient was placed onto the [facility] bed, boots placed into the leg  holders. The Right hip was then isolated from the perineum with plastic  drapes and prepped and draped in the usual sterile fashion. ASIS and  greater trochanter were marked and a oblique incision was made, starting  at about 1 cm lateral and 2 cm distal to the ASIS and coursing towards  the anterior cortex of the femur. The skin was cut with a 10 blade  through subcutaneous tissue to the level of the fascia overlying the  tensor fascia lata muscle. The fascia was then incised in line with the  incision at the junction of the anterior third and posterior 2/3rd. The  muscle was teased off the fascia and then the interval between the TFL  and the rectus was developed. The Hohmann retractor was then placed at  the top of the femoral neck over the capsule. The vessels overlying the  capsule were cauterized and the fat on top of the capsule was removed.  A Hohmann retractor was then placed anterior underneath the rectus  femoris to give exposure to the entire anterior capsule. A T-shaped   capsulotomy was performed. The edges were tagged and the femoral head  was identified.       Osteophytes are removed off the superior acetabulum.  The femoral neck was then cut in situ with an oscillating saw. Traction  was then applied to the left lower extremity utilizing the [facility]  traction. The femoral head was then removed. Retractors were placed  around the acetabulum and then circumferential removal of the labrum was  performed. Osteophytes were also removed. Reaming starts at 49 mm to  medialize and  Increased in 2 mm increments to 51 mm. We reamed in  approximately 40 degrees of abduction, 20 degrees anteversion. A 52 mm  pinnacle acetabular shell was then impacted in anatomic position under  fluoroscopic guidance with excellent purchase. We did not need to place  any additional dome screws. A 32 mm neutral + 4 marathon liner was then  placed into the acetabular shell.       The femoral lift was then placed along the lateral aspect of the femur  just distal to the vastus ridge. The leg was  externally rotated and capsule  was stripped off the inferior aspect of the femoral neck down to the  level of the lesser trochanter, this was done with electrocautery. The femur was lifted after this was performed. The  leg was then placed in an extended and adducted position essentially delivering the femur. We also removed the capsule superiorly and the piriformis from the piriformis fossa  to gain excellent exposure of the  proximal femur. Rongeur was used to remove some cancellous bone to get  into the lateral portion of the proximal femur for placement of the  initial starter reamer. The starter broaches was placed  the starter broach  and was shown to go down the center of the canal. Broaching  with the Actis system was then performed starting at size 0  coursing  Up to size 6. A size 6 had excellent torsional and rotational  and axial stability. The trial high offset neck was then placed   with a 32 + 5 trial head. The hip was then reduced. We confirmed that  the stem was in the canal both on AP and lateral x-rays. It also has excellent sizing. The hip was reduced with outstanding stability through full extension and full external rotation.. AP pelvis was taken and the leg lengths were measured and found to be equal. Hip was then dislocated again and the femoral head and neck removed. The  femoral broach was removed. Size 6 Actis stem with a high offset  neck was then impacted into the femur following native anteversion. Has  excellent purchase in the canal. Excellent torsional and rotational and  axial stability. It is confirmed to be in the canal on AP and lateral  fluoroscopic views. The 32 + 5 ceramic head was placed and the hip  reduced with outstanding stability. Again AP pelvis was taken and it  confirmed that the leg lengths were equal. The wound was then copiously  irrigated with saline solution and the capsule reattached and repaired  with Ethibond suture. 30 ml of .25% Bupivicaine was  injected into the capsule and into the edge of the tensor fascia lata as well as subcutaneous tissue. The fascia overlying the tensor fascia lata was then closed with a running #1 V-Loc. Subcu was closed with interrupted 2-0 Vicryl and subcuticular running 4-0 Monocryl. Incision was cleaned  and dried. Steri-Strips and a bulky sterile dressing applied. Hemovac  drain was hooked to suction and then the patient was awakened and transported to  recovery in stable condition.        Please note that a surgical assistant was a medical necessity for this procedure to perform it in a safe and expeditious manner. Assistant was necessary to provide appropriate retraction of vital neurovascular structures and to prevent femoral fracture and allow for anatomic placement of the prosthesis.  Gaynelle Arabian, M.D.

## 2019-06-07 NOTE — Anesthesia Postprocedure Evaluation (Signed)
Anesthesia Post Note  Patient: [name]  Procedure(s) Performed: TOTAL HIP ARTHROPLASTY ANTERIOR APPROACH (Right Hip)     Patient location during evaluation: PACU Anesthesia Type: Spinal Level of consciousness: oriented and awake and alert Pain management: pain level controlled Vital Signs Assessment: post-procedure vital signs reviewed and stable Respiratory status: spontaneous breathing, respiratory function stable and patient connected to nasal cannula oxygen Cardiovascular status: blood pressure returned to baseline and stable Postop Assessment: no headache, no backache, no apparent nausea or vomiting, spinal receding and patient able to bend at knees Anesthetic complications: no    Last Vitals:  Vitals:   06/07/19 1715 06/07/19 1728  BP: 132/89 (!) 128/96  Pulse: 84 75  Resp: 18 18  Temp: (!) 36.4 C 36.4 C  SpO2: 99% 100%    Last Pain:  Vitals:   06/07/19 1728  TempSrc: Oral  PainSc:     LLE Motor Response: Purposeful movement;Responds to commands (06/07/19 1715) LLE Sensation: Numbness (06/07/19 1715) RLE Motor Response: Purposeful movement;Responds to commands (06/07/19 1715) RLE Sensation: Numbness (06/07/19 1715) L Sensory Level: S1-Sole of foot, small toes (06/07/19 1715) R Sensory Level: S1-Sole of foot, small toes (06/07/19 1715)  Ledell Codrington,W. EDMOND

## 2019-06-07 NOTE — Discharge Instructions (Signed)
°Dr. Frank Aluisio °Total Joint Specialist °[facility] °[address] °[place], [place] [address] °[phone] ° °ANTERIOR APPROACH TOTAL HIP REPLACEMENT POSTOPERATIVE DIRECTIONS ° ° °Hip Rehabilitation, Guidelines Following Surgery  °The results of a hip operation are greatly improved after range of motion and muscle strengthening exercises. Follow all safety measures which are given to protect your hip. If any of these exercises cause increased pain or swelling in your joint, decrease the amount until you are comfortable again. Then slowly increase the exercises. Call your caregiver if you have problems or questions.  ° °HOME CARE INSTRUCTIONS  °• Remove items at home which could result in a fall. This includes throw rugs or furniture in walking pathways.  °· ICE to the affected hip every three hours for 30 minutes at a time and then as needed for pain and swelling.  Continue to use ice on the hip for pain and swelling from surgery. You may notice swelling that will progress down to the foot and ankle.  This is normal after surgery.  Elevate the leg when you are not up walking on it.   °· Continue to use the breathing machine which will help keep your temperature down.  It is common for your temperature to cycle up and down following surgery, especially at night when you are not up moving around and exerting yourself.  The breathing machine keeps your lungs expanded and your temperature down. ° °DIET °You may resume your previous home diet once your are discharged from the hospital. ° °DRESSING / WOUND CARE / SHOWERING °You may shower 3 days after surgery, but keep the wounds dry during showering.  You may use an occlusive plastic wrap (Press'n Seal for example), NO SOAKING/SUBMERGING IN THE BATHTUB.  If the bandage gets wet, change with a clean dry gauze.  If the incision gets wet, pat the wound dry with a clean towel. °You may start showering once you are discharged home but do not submerge the  incision under water. Just pat the incision dry and apply a dry gauze dressing on daily. °Change the surgical dressing daily and reapply a dry dressing each time. ° °ACTIVITY °Walk with your walker as instructed. °Use walker as long as suggested by your caregivers. °Avoid periods of inactivity such as sitting longer than an hour when not asleep. This helps prevent blood clots.  °You may resume a sexual relationship in one month or when given the OK by your doctor.  °You may return to work once you are cleared by your doctor.  °Do not drive a car for 6 weeks or until released by you surgeon.  °Do not drive while taking narcotics. ° °WEIGHT BEARING °Weight bearing as tolerated with assist device (walker, cane, etc) as directed, use it as long as suggested by your surgeon or therapist, typically at least 4-6 weeks. ° °POSTOPERATIVE CONSTIPATION PROTOCOL °Constipation - defined medically as fewer than three stools per week and severe constipation as less than one stool per week. ° °One of the most common issues patients have following surgery is constipation.  Even if you have a regular bowel pattern at home, your normal regimen is likely to be disrupted due to multiple reasons following surgery.  Combination of anesthesia, postoperative narcotics, change in appetite and fluid intake all can affect your bowels.  In order to avoid complications following surgery, here are some recommendations in order to help you during your recovery period. ° °Colace (docusate) - Pick up an over-the-counter form   of Colace or another stool softener and take twice a day as long as you are requiring postoperative pain medications.  Take with a full glass of water daily.  If you experience loose stools or diarrhea, hold the colace until you stool forms back up.  If your symptoms do not get better within 1 week or if they get worse, check with your doctor. ° °Dulcolax (bisacodyl) - Pick up over-the-counter and take as directed by the product  packaging as needed to assist with the movement of your bowels.  Take with a full glass of water.  Use this product as needed if not relieved by Colace only.  ° °MiraLax (polyethylene glycol) - Pick up over-the-counter to have on hand.  MiraLax is a solution that will increase the amount of water in your bowels to assist with bowel movements.  Take as directed and can mix with a glass of water, juice, soda, coffee, or tea.  Take if you go more than two days without a movement. °Do not use MiraLax more than once per day. Call your doctor if you are still constipated or irregular after using this medication for 7 days in a row. ° °If you continue to have problems with postoperative constipation, please contact the office for further assistance and recommendations.  If you experience "the worst abdominal pain ever" or develop nausea or vomiting, please contact the office immediatly for further recommendations for treatment. ° °ITCHING ° If you experience itching with your medications, try taking only a single pain pill, or even half a pain pill at a time.  You can also use Benadryl over the counter for itching or also to help with sleep.  ° °TED HOSE STOCKINGS °Wear the elastic stockings on both legs for three weeks following surgery during the day but you may remove then at night for sleeping. ° °MEDICATIONS °See your medication summary on the “After Visit Summary” that the nursing staff will review with you prior to discharge.  You may have some home medications which will be placed on hold until you complete the course of blood thinner medication.  It is important for you to complete the blood thinner medication as prescribed by your surgeon.  Continue your approved medications as instructed at time of discharge. ° °PRECAUTIONS °If you experience chest pain or shortness of breath - call 911 immediately for transfer to the hospital emergency department.  °If you develop a fever greater that 101 F, purulent drainage  from wound, increased redness or drainage from wound, foul odor from the wound/dressing, or calf pain - CONTACT YOUR SURGEON.   °                                                °FOLLOW-UP APPOINTMENTS °Make sure you keep all of your appointments after your operation with your surgeon and caregivers. You should call the office at the above phone number and make an appointment for approximately two weeks after the date of your surgery or on the date instructed by your surgeon outlined in the "After Visit Summary". ° °RANGE OF MOTION AND STRENGTHENING EXERCISES  °These exercises are designed to help you keep full movement of your hip joint. Follow your caregiver's or physical therapist's instructions. Perform all exercises about fifteen times, three times per day or as directed. Exercise both hips, even if you have   had only one joint replacement. These exercises can be done on a training (exercise) mat, on the floor, on a table or on a bed. Use whatever works the best and is most comfortable for you. Use music or television while you are exercising so that the exercises are a pleasant break in your day. This will make your life better with the exercises acting as a break in routine you can look forward to.  °• Lying on your back, slowly slide your foot toward your buttocks, raising your knee up off the floor. Then slowly slide your foot back down until your leg is straight again.  °• Lying on your back spread your legs as far apart as you can without causing discomfort.  °• Lying on your side, raise your upper leg and foot straight up from the floor as far as is comfortable. Slowly lower the leg and repeat.  °• Lying on your back, tighten up the muscle in the front of your thigh (quadriceps muscles). You can do this by keeping your leg straight and trying to raise your heel off the floor. This helps strengthen the largest muscle supporting your knee.  °• Lying on your back, tighten up the muscles of your buttocks both  with the legs straight and with the knee bent at a comfortable angle while keeping your heel on the floor.  ° °IF YOU ARE TRANSFERRED TO A SKILLED REHAB FACILITY °If the patient is transferred to a skilled rehab facility following release from the hospital, a list of the current medications will be sent to the facility for the patient to continue.  When discharged from the skilled rehab facility, please have the facility set up the patient's Home Health Physical Therapy prior to being released. Also, the skilled facility will be responsible for providing the patient with their medications at time of release from the facility to include their pain medication, the muscle relaxants, and their blood thinner medication. If the patient is still at the rehab facility at time of the two week follow up appointment, the skilled rehab facility will also need to assist the patient in arranging follow up appointment in our office and any transportation needs. ° °MAKE SURE YOU:  °• Understand these instructions.  °• Get help right away if you are not doing well or get worse.  ° ° °Pick up stool softner and laxative for home use following surgery while on pain medications. °Do not submerge incision under water. °Please use good hand washing techniques while changing dressing each day. °May shower starting three days after surgery. °Please use a clean towel to pat the incision dry following showers. °Continue to use ice for pain and swelling after surgery. °Do not use any lotions or creams on the incision until instructed by your surgeon. ° °

## 2019-06-07 NOTE — Anesthesia Preprocedure Evaluation (Addendum)
Anesthesia Evaluation  Patient identified by MRN, date of birth, ID band Patient awake    Reviewed: Allergy & Precautions, NPO status , Patient's Chart, lab work & pertinent test results  Airway Mallampati: II  TM Distance: >3 FB Neck ROM: Full    Dental  (+) Dental Advisory Given, Edentulous Upper, Missing,    Pulmonary sleep apnea , Current Smoker and Patient abstained from smoking.,    Pulmonary exam normal        Cardiovascular hypertension, Pt. on medications and Pt. on home beta blockers  Rhythm:Regular Rate:Normal     Neuro/Psych negative neurological ROS  negative psych ROS   GI/Hepatic negative GI ROS, Neg liver ROS,   Endo/Other  diabetes, Type 2, Oral Hypoglycemic Agents  Renal/GU      Musculoskeletal  (+) Arthritis ,   Abdominal (+) + obese,   Peds  Hematology negative hematology ROS (+)   Anesthesia Other Findings   Reproductive/Obstetrics                            Lab Results  Component Value Date   WBC 12.7 (H) 06/02/2019   HGB 15.1 06/02/2019   HCT 45.7 06/02/2019   MCV 98.5 06/02/2019   PLT 254 06/02/2019   Lab Results  Component Value Date   INR 1.0 06/02/2019   INR 0.9 03/29/2019   Echo:  Echocardiogram 05/06/2019:  Normal LV systolic function with EF 55%. Left ventricle cavity is normal in size. Normal global wall motion. Doppler evidence of grade I (impaired) diastolic dysfunction, normal LAP. Calculated EF 55%. Left atrial cavity is normal in size. Lipomatous hypertrophy of the interatrial septum is present. No pericardial effusion. Anterior fat pad noted. The aortic root is mildly dilated at 3.9 cm.  Lexiscan Myoview Stress Test10/06/2019: 1. Resting EKG normal sinus rhythm, left axis deviation, left anterior fascicular block, right bundle branch block. Low-voltage complexes. Stress EKG nondiagnostic due to Lexiscan infusion. 2. Nuclear perfusion  reveals diaphragmatic attenuation in the inferior wall. There is no demonstrable ischemia or infarct. LVEF was 58% without wall motion abnormality. This is a low risk stress test. No previous exam available for comparison.   Anesthesia Physical Anesthesia Plan  ASA: II  Anesthesia Plan: Spinal   Post-op Pain Management:    Induction: Intravenous  PONV Risk Score and Plan: 2 and Ondansetron, Dexamethasone and Midazolam  Airway Management Planned: Natural Airway and Simple Face Mask  Additional Equipment: None  Intra-op Plan:   Post-operative Plan:   Informed Consent: I have reviewed the patients History and Physical, chart, labs and discussed the procedure including the risks, benefits and alternatives for the proposed anesthesia with the patient or authorized representative who has indicated his/her understanding and acceptance.       Plan Discussed with: CRNA  Anesthesia Plan Comments:        Anesthesia Quick Evaluation

## 2019-06-07 NOTE — Anesthesia Procedure Notes (Signed)
Spinal  Patient location during procedure: OR Start time: 06/07/2019 3:12 PM End time: 06/07/2019 3:15 PM Staffing Anesthesiologist: Roderic Palau, MD Resident/CRNA: Raenette Rover, CRNA Performed: resident/CRNA  Preanesthetic Checklist Completed: patient identified, site marked, surgical consent, pre-op evaluation, timeout performed, IV checked, risks and benefits discussed and monitors and equipment checked Spinal Block Patient position: sitting Prep: DuraPrep Patient monitoring: heart rate, continuous pulse ox and blood pressure Approach: midline Location: L3-4 Injection technique: single-shot Needle Needle type: Pencan  Needle gauge: 24 G Assessment Sensory level: T6

## 2019-06-07 NOTE — Plan of Care (Signed)
Continue with current POC

## 2019-06-07 NOTE — Plan of Care (Signed)

## 2019-06-08 ENCOUNTER — Encounter (HOSPITAL_COMMUNITY): Payer: Self-pay | Admitting: Orthopedic Surgery

## 2019-06-08 LAB — BASIC METABOLIC PANEL
Anion gap: 12 (ref 5–15)
BUN: 20 mg/dL (ref 8–23)
CO2: 22 mmol/L (ref 22–32)
Calcium: 9.1 mg/dL (ref 8.9–10.3)
Chloride: 101 mmol/L (ref 98–111)
Creatinine, Ser: 1.35 mg/dL — ABNORMAL HIGH (ref 0.61–1.24)
GFR calc Af Amer: >60 mL/min (ref >60)
GFR calc non Af Amer: 55 mL/min — ABNORMAL LOW (ref >60)
Glucose, Bld: 141 mg/dL — ABNORMAL HIGH (ref 70–99)
Potassium: 4.8 mmol/L (ref 3.5–5.1)
Sodium: 135 mmol/L (ref 135–145)

## 2019-06-08 LAB — CBC
HCT: 39.7 % (ref 39.0–52.0)
Hemoglobin: 13.1 g/dL (ref 13.0–17.0)
MCH: 32.5 pg (ref 26.0–34.0)
MCHC: 33 g/dL (ref 30.0–36.0)
MCV: 98.5 fL (ref 80.0–100.0)
Platelets: 230 10*3/uL (ref 150–400)
RBC: 4.03 MIL/uL — ABNORMAL LOW (ref 4.22–5.81)
RDW: 13.2 % (ref 11.5–15.5)
WBC: 17 10*3/uL — ABNORMAL HIGH (ref 4.0–10.5)
nRBC: 0 % (ref 0.0–0.2)

## 2019-06-08 LAB — GLUCOSE, CAPILLARY
Glucose-Capillary: 182 mg/dL — ABNORMAL HIGH (ref 70–99)
Glucose-Capillary: 235 mg/dL — ABNORMAL HIGH (ref 70–99)

## 2019-06-08 MED ORDER — ASPIRIN 325 MG PO TBEC: 325.0000 mg | DELAYED_RELEASE_TABLET | Freq: Two times a day (BID) | ORAL | 0 refills | Status: AC

## 2019-06-08 MED ORDER — HYDROCODONE-ACETAMINOPHEN 5-325 MG PO TABS: 1.0000 | ORAL_TABLET | Freq: Four times a day (QID) | ORAL | 0 refills | Status: DC | PRN

## 2019-06-08 MED ORDER — METHOCARBAMOL 500 MG PO TABS: 500.0000 mg | ORAL_TABLET | Freq: Four times a day (QID) | ORAL | 0 refills | Status: DC | PRN

## 2019-06-08 NOTE — Progress Notes (Signed)
Physical Therapy Treatment Patient Details Name: Gavin Hopkins MRN: [id number] DOB: [birth year] Today's Date: 06/08/2019    History of Present Illness 63 yo male s/p R THA-direct anterior 06/07/2019.    PT Comments    Practiced/reviewed exercises, gait training, and stair training. Pt stated he already has an HEP that he will continue to use. Encouraged him to perform exercises 2x/day and to walk often at home. All education completed. Okay to d/c from PT standpoint.     Follow Up Recommendations  Follow surgeon's recommendation for DC plan and follow-up therapies     Equipment Recommendations  Rolling walker with 5" wheels    Recommendations for Other Services       Precautions / Restrictions Precautions Precautions: Fall Restrictions Weight Bearing Restrictions: No Other Position/Activity Restrictions: WBAT    Mobility  Bed Mobility               General bed mobility comments: oob in recliner  Transfers Overall transfer level: Needs assistance Equipment used: Rolling walker (2 wheeled) Transfers: Sit to/from Stand Sit to Stand: Min guard         General transfer comment: close guard for safety. vcs safety, hand placement. increased time.  Ambulation/Gait Ambulation/Gait assistance: Min guard Gait Distance (Feet): 120 Feet Assistive device: Rolling walker (2 wheeled) Gait Pattern/deviations: Step-to pattern;Step-through pattern;Decreased stride length     General Gait Details: antalgic gait still present. cues for safety   Stairs Stairs: Yes Stairs assistance: Min assist Stair Management: Forwards;With walker;Step to pattern Number of Stairs: 2 General stair comments: up and over portable stairs with RW. VCs safety, technique, sequence. Assist to stabilize walker.   Wheelchair Mobility    Modified Rankin (Stroke Patients Only)       Balance Overall balance assessment: Needs assistance         Standing balance support: Bilateral  upper extremity supported Standing balance-Leahy Scale: Poor                              Cognition Arousal/Alertness: Awake/alert Behavior During Therapy: WFL for tasks assessed/performed Overall Cognitive Status: Within Functional Limits for tasks assessed                                        Exercises Total Joint Exercises Hip ABduction/ADduction: AROM;Right;10 reps;Seated Heel Slides; AROM; Right; 10 reps;Seated Quad Sets; AROM; Both; 10 reps; Seated    General Comments        Pertinent Vitals/Pain Pain Assessment: 0-10 Pain Score: 5  Pain Location: R hip/thigh Pain Descriptors / Indicators: Sore;Aching;Discomfort Pain Intervention(s): Monitored during session;Repositioned    Home Living                      Prior Function            PT Goals (current goals can now be found in the care plan section) Acute Rehab PT Goals Patient Stated Goal: less pain. less antalgic gait/limp PT Goal Formulation: With patient Time For Goal Achievement: 06/22/19 Potential to Achieve Goals: Good Progress towards PT goals: Progressing toward goals    Frequency    7X/week      PT Plan Current plan remains appropriate    Co-evaluation              AM-PAC PT "6 Clicks" Mobility   Outcome Measure  Help needed turning from your back to your side while in a flat bed without using bedrails?: A Little Help needed moving from lying on your back to sitting on the side of a flat bed without using bedrails?: A Little Help needed moving to and from a bed to a chair (including a wheelchair)?: A Little Help needed standing up from a chair using your arms (e.g., wheelchair or bedside chair)?: A Little Help needed to walk in hospital room?: A Little Help needed climbing 3-5 steps with a railing? : A Little 6 Click Score: 18    End of Session Equipment Utilized During Treatment: Gait belt Activity Tolerance: Patient tolerated treatment  well Patient left: in chair;with call bell/phone within reach   PT Visit Diagnosis: Pain;Other abnormalities of gait and mobility (R26.89) Pain - Right/Left: Right Pain - part of body: Hip     Time: [id number] PT Time Calculation (min) (ACUTE ONLY): 9 min  Charges:  $Gait Training: 8-22 mins                        Weston Anna, [place] Pager: [phone] Office: [phone]

## 2019-06-08 NOTE — Evaluation (Signed)
Physical Therapy Evaluation Patient Details Name: Gavin Hopkins MRN: [id number] DOB: 29-Mar-1956 Today's Date: 06/08/2019   History of Present Illness  63 yo male s/p R THA-direct anterior 06/07/2019.  Clinical Impression  On eval, pt was Min guard assist for mobility. He walked ~140 feet with a RW. Moderate pain with activity. Will plan to have a 2nd session prior to d/c later today.     Follow Up Recommendations Follow surgeon's recommendation for DC plan and follow-up therapies    Equipment Recommendations  Rolling walker with 5" wheels    Recommendations for Other Services       Precautions / Restrictions Precautions Precautions: Fall Restrictions Weight Bearing Restrictions: No Other Position/Activity Restrictions: WBAT      Mobility  Bed Mobility               General bed mobility comments: oob in recliner  Transfers Overall transfer level: Needs assistance   Transfers: Sit to/from Stand Sit to Stand: Min guard         General transfer comment: close guard for safety. vcs safety, hand placement. increased time.  Ambulation/Gait Ambulation/Gait assistance: Min guard Gait Distance (Feet): 140 Feet Assistive device: Rolling walker (2 wheeled) Gait Pattern/deviations: Step-through pattern;Decreased stride length;Antalgic     General Gait Details: moderately antalgic gait. cues for safety.  Stairs            Wheelchair Mobility    Modified Rankin (Stroke Patients Only)       Balance Overall balance assessment: Needs assistance           Standing balance-Leahy Scale: Fair                               Pertinent Vitals/Pain Pain Assessment: 0-10 Pain Score: 6  Pain Location: R hip/thigh Pain Descriptors / Indicators: Sore;Aching;Discomfort Pain Intervention(s): Monitored during session;Ice applied    Home Living Family/patient expects to be discharged to:: (P) Private residence Living Arrangements: (P) Alone    Type of Home: (P) House Home Access: (P) Stairs to enter Entrance Stairs-Rails: (P) None Entrance Stairs-Number of Steps: (P) 2 Home Layout: (P) One level Home Equipment: (P) Walker - 2 wheels;Cane - single point      Prior Function Level of Independence: (P) Independent               Hand Dominance        Extremity/Trunk Assessment   Upper Extremity Assessment Upper Extremity Assessment: Overall WFL for tasks assessed    Lower Extremity Assessment Lower Extremity Assessment: Generalized weakness    Cervical / Trunk Assessment Cervical / Trunk Assessment: Normal  Communication   Communication: (P) No difficulties  Cognition Arousal/Alertness: Awake/alert Behavior During Therapy: WFL for tasks assessed/performed Overall Cognitive Status: Within Functional Limits for tasks assessed                                        General Comments      Exercises Total Joint Exercises Hip ABduction/ADduction: AROM;Right;5 reps;Standing Long Arc Quad: AROM;Right;5 reps;Seated Marching in Standing: AROM;Both;10 reps;Standing General Exercises - Lower Extremity Heel Raises: AROM;Both;5 reps;Standing   Assessment/Plan    PT Assessment Patient needs continued PT services  PT Problem List Decreased strength;Decreased mobility;Decreased range of motion;Decreased activity tolerance;Decreased balance;Decreased knowledge of use of DME;Pain       PT Treatment Interventions  DME instruction;Gait training;Therapeutic exercise;Therapeutic activities;Patient/family education;Balance training;Functional mobility training;Stair training    PT Goals (Current goals can be found in the Care Plan section)  Acute Rehab PT Goals Patient Stated Goal: less pain. less antalgic gait/limp PT Goal Formulation: With patient Time For Goal Achievement: 06/22/19 Potential to Achieve Goals: Good    Frequency 7X/week   Barriers to discharge        Co-evaluation                AM-PAC PT "6 Clicks" Mobility  Outcome Measure Help needed turning from your back to your side while in a flat bed without using bedrails?: A Little Help needed moving from lying on your back to sitting on the side of a flat bed without using bedrails?: A Little Help needed moving to and from a bed to a chair (including a wheelchair)?: A Little Help needed standing up from a chair using your arms (e.g., wheelchair or bedside chair)?: A Little Help needed to walk in hospital room?: A Little Help needed climbing 3-5 steps with a railing? : A Little 6 Click Score: 18    End of Session Equipment Utilized During Treatment: Gait belt Activity Tolerance: Patient tolerated treatment well Patient left: in chair;with call bell/phone within reach   PT Visit Diagnosis: Pain;Other abnormalities of gait and mobility (R26.89) Pain - Right/Left: Right Pain - part of body: Hip    Time: [id number] PT Time Calculation (min) (ACUTE ONLY): 15 min   Charges:   PT Evaluation $PT Eval Low Complexity: [place], PT Acute Rehabilitation Services Pager: [phone] Office: [phone]

## 2019-06-08 NOTE — Progress Notes (Signed)
   Subjective: 1 Day Post-Op Procedure(s) (LRB): TOTAL HIP ARTHROPLASTY ANTERIOR APPROACH (Right) Patient reports pain as mild.   Patient seen in rounds by Dr. Wynelle Link. Patient is well, and has had no acute complaints or problems other than discomfort in the right hip. No acute events overnight. Foley catheter removed, positive flatus. Denies CP, SHOB.  We will start therapy today.   Objective: Vital signs in last 24 hours: Temp:  [97.4 F (36.3 C)-99.4 F (37.4 C)] 98.3 F (36.8 C) (11/10 0049) Pulse Rate:  [75-100] 99 (11/10 0049) Resp:  [14-20] 18 (11/10 0049) BP: (112-144)/(85-110) 124/88 (11/10 0049) SpO2:  [93 %-100 %] 97 % (11/10 0049) Weight:  [93 kg] 93 kg (11/09 1336)  Intake/Output from previous day:  Intake/Output Summary (Last 24 hours) at 06/08/2019 0814 Last data filed at 06/08/2019 0600 Gross per 24 hour  Intake 2782.64 ml  Output 2100 ml  Net 682.64 ml     Intake/Output this shift: No intake/output data recorded.  Labs: Recent Labs    06/08/19 0156  HGB 13.1   Recent Labs    06/08/19 0156  WBC 17.0*  RBC 4.03*  HCT 39.7  PLT 230   Recent Labs    06/08/19 0156  NA 135  K 4.8  CL 101  CO2 22  BUN 20  CREATININE 1.35*  GLUCOSE 141*  CALCIUM 9.1   No results for input(s): LABPT, INR in the last 72 hours.  Exam: General - Patient is Alert and Oriented Extremity - Neurologically intact Sensation intact distally Intact pulses distally Dorsiflexion/Plantar flexion intact Dressing - dressing C/D/I Motor Function - intact, moving foot and toes well on exam.   Past Medical History:  Diagnosis Date  . Arthritis    oa  . Diabetes mellitus without complication ([place])    type 2  . Displaced fracture of lateral malleolus of right fibula, initial encounter for closed fracture 30 yrs ago   and tibula, full cast applied  . Hypertension   . Sleep apnea    could not tolerate cpap    Assessment/Plan: 1 Day Post-Op Procedure(s) (LRB):  TOTAL HIP ARTHROPLASTY ANTERIOR APPROACH (Right) Active Problems:   OA (osteoarthritis) of hip  Estimated body mass index is 31.19 kg/m as calculated from the following:   Height as of this encounter: 5\' 8"  (1.727 m).   Weight as of this encounter: 93 kg. Advance diet Up with therapy D/C IV fluids  DVT Prophylaxis - Aspirin Weight bearing as tolerated. D/C O2 and pulse ox and try on room air. Hemovac pulled without difficulty, will begin therapy.  Plan is to go Home after hospital stay with HEP. Plan for discharge today following 1-2 sessions of therapy as long as he is meeting goals. Follow up in the office in 2 weeks.   Griffith Citron, PA-C Orthopedic Surgery [phone] 06/08/2019, 8:14 AM

## 2019-06-08 NOTE — TOC Transition Note (Signed)
Transition of Care [facility]) - CM/SW Discharge Note   Patient Details  Name: Gavin Hopkins MRN: [id number] Date of Birth: 1955-12-31  Transition of Care [facility]) CM/SW Contact:  Lia Hopping, [place] Phone Number: 06/08/2019, 10:47 AM   Clinical Narrative:    Therapy Plan: HEP RW and 3 IN 1 delivered by [facility]   Final next level of care: Home/Self Care(HEP)     Patient Goals and CMS Choice        Discharge Placement                       Discharge Plan and Services                DME Arranged: 3-N-1, Walker rolling DME Agency: [employer] Date DME Agency Contacted: 06/08/19 Time DME Agency Contacted: 0900 Representative spoke with at DME Agency: [place] ([place]) Interventions     Readmission Risk Interventions No flowsheet data found.

## 2019-06-10 ENCOUNTER — Telehealth: Payer: Self-pay | Admitting: Cardiology

## 2019-06-10 NOTE — Telephone Encounter (Signed)
That's fine

## 2019-06-14 ENCOUNTER — Ambulatory Visit: Payer: No Typology Code available for payment source | Admitting: Cardiology

## 2019-06-14 NOTE — Discharge Summary (Signed)
Physician Discharge Summary   Patient ID: Gavin Hopkins MRN: [id number] DOB/AGE: 1955-09-21 63 y.o.  Admit date: 06/07/2019 Discharge date: 06/08/2019  Primary Diagnosis: Osteoarthritis of the Right hip.  Admission Diagnoses:  Past Medical History:  Diagnosis Date  . Arthritis    oa  . Diabetes mellitus without complication ([place])    type 2  . Displaced fracture of lateral malleolus of right fibula, initial encounter for closed fracture 30 yrs ago   and tibula, full cast applied  . Hypertension   . Sleep apnea    could not tolerate cpap   Discharge Diagnoses:   Active Problems:   OA (osteoarthritis) of hip  Estimated body mass index is 31.19 kg/m as calculated from the following:   Height as of this encounter: 5\' 8"  (1.727 m).   Weight as of this encounter: 93 kg.  Procedure:  Procedure(s) (LRB): TOTAL HIP ARTHROPLASTY ANTERIOR APPROACH (Right)   Consults: None  HPI: Gavin Hopkins is a 63 y.o. male who has advanced end-  stage arthritis of their Right  hip with progressively worsening pain and  dysfunction.The patient has failed nonoperative management and presents for  total hip arthroplasty.   Laboratory Data: Admission on 06/07/2019, Discharged on 06/08/2019  Component Date Value Ref Range Status  . Glucose-Capillary 06/07/2019 150* 70 - 99 mg/dL Final  . Comment 1 06/07/2019 Notify RN   Final  . Comment 2 06/07/2019 Document in Chart   Final  . Glucose-Capillary 06/07/2019 104* 70 - 99 mg/dL Final  . WBC 06/08/2019 17.0* 4.0 - 10.5 K/uL Final  . RBC 06/08/2019 4.03* 4.22 - 5.81 MIL/uL Final  . Hemoglobin 06/08/2019 13.1  13.0 - 17.0 g/dL Final  . HCT 06/08/2019 39.7  39.0 - 52.0 % Final  . MCV 06/08/2019 98.5  80.0 - 100.0 fL Final  . MCH 06/08/2019 32.5  26.0 - 34.0 pg Final  . MCHC 06/08/2019 33.0  30.0 - 36.0 g/dL Final  . RDW 06/08/2019 13.2  11.5 - 15.5 % Final  . Platelets 06/08/2019 230  150 - 400 K/uL Final  . nRBC 06/08/2019 0.0  0.0 - 0.2  % Final   Performed at [facility], [place] [address], [place], [place] [address]  . Sodium 06/08/2019 135  135 - 145 mmol/L Final  . Potassium 06/08/2019 4.8  3.5 - 5.1 mmol/L Final  . Chloride 06/08/2019 101  98 - 111 mmol/L Final  . CO2 06/08/2019 22  22 - 32 mmol/L Final  . Glucose, Bld 06/08/2019 141* 70 - 99 mg/dL Final  . BUN 06/08/2019 20  8 - 23 mg/dL Final  . Creatinine, Ser 06/08/2019 1.35* 0.61 - 1.24 mg/dL Final  . Calcium 06/08/2019 9.1  8.9 - 10.3 mg/dL Final  . GFR calc non Af Amer 06/08/2019 55* >60 mL/min Final  . GFR calc Af Amer 06/08/2019 >60  >60 mL/min Final  . Anion gap 06/08/2019 12  5 - 15 Final   Performed at [facility], [place] [address] [place], [place] [address]  . Glucose-Capillary 06/07/2019 178* 70 - 99 mg/dL Final  . Glucose-Capillary 06/08/2019 182* 70 - 99 mg/dL Final  . Glucose-Capillary 06/08/2019 235* 70 - 99 mg/dL Final  Hospital Outpatient Visit on 06/03/2019  Component Date Value Ref Range Status  . SARS-CoV-2, NAA 06/03/2019 NOT DETECTED  NOT DETECTED Final   Comment: (NOTE) This nucleic acid amplification test was developed and its performance characteristics determined by [employer]. Nucleic acid amplification tests include PCR  and TMA. This test has not been FDA cleared or approved. This test has been authorized by FDA under an Emergency Use Authorization (EUA). This test is only authorized for the duration of time the declaration that circumstances exist justifying the authorization of the emergency use of in vitro diagnostic tests for detection of SARS-CoV-2 virus and/or diagnosis of COVID-19 infection under section 564(b)(1) of the Act, 21 U.S.C. [id number]) (1), unless the authorization is terminated or revoked sooner. When diagnostic testing is negative, the possibility of a false negative result should be considered in the context of a patient's recent exposures and the presence of  clinical signs and symptoms consistent with COVID-19. An individual without symptoms of COVID- 19 and who is not shedding SARS-CoV-2 vi                          rus would expect to have a negative (not detected) result in this assay. Performed At: [facility] [place], [place] [id number] Rush Farmer MD [id number]   . Coronavirus Source 06/03/2019 NASOPHARYNGEAL   Final   Performed at [facility], [place] [address], [place], [place] [address]  Hospital Outpatient Visit on 06/02/2019  Component Date Value Ref Range Status  . aPTT 06/02/2019 29  24 - 36 seconds Final   Performed at [facility], [place] [address] [place], [place] [address]  . WBC 06/02/2019 12.7* 4.0 - 10.5 K/uL Final  . RBC 06/02/2019 4.64  4.22 - 5.81 MIL/uL Final  . Hemoglobin 06/02/2019 15.1  13.0 - 17.0 g/dL Final  . HCT 06/02/2019 45.7  39.0 - 52.0 % Final  . MCV 06/02/2019 98.5  80.0 - 100.0 fL Final  . MCH 06/02/2019 32.5  26.0 - 34.0 pg Final  . MCHC 06/02/2019 33.0  30.0 - 36.0 g/dL Final  . RDW 06/02/2019 13.6  11.5 - 15.5 % Final  . Platelets 06/02/2019 254  150 - 400 K/uL Final  . nRBC 06/02/2019 0.0  0.0 - 0.2 % Final   Performed at [facility], [place] [address], [place], [place] [address]  . Sodium 06/02/2019 137  135 - 145 mmol/L Final  . Potassium 06/02/2019 4.8  3.5 - 5.1 mmol/L Final  . Chloride 06/02/2019 104  98 - 111 mmol/L Final  . CO2 06/02/2019 24  22 - 32 mmol/L Final  . Glucose, Bld 06/02/2019 100* 70 - 99 mg/dL Final  . BUN 06/02/2019 29* 8 - 23 mg/dL Final  . Creatinine, Ser 06/02/2019 1.29* 0.61 - 1.24 mg/dL Final  . Calcium 06/02/2019 9.4  8.9 - 10.3 mg/dL Final  . Total Protein 06/02/2019 7.7  6.5 - 8.1 g/dL Final  . Albumin 06/02/2019 4.3  3.5 - 5.0 g/dL Final  . AST 06/02/2019 50* 15 - 41 U/L Final  . ALT 06/02/2019 65* 0 - 44 U/L Final  . Alkaline Phosphatase 06/02/2019 66  38 - 126 U/L Final  . Total Bilirubin  06/02/2019 0.8  0.3 - 1.2 mg/dL Final  . GFR calc non Af Amer 06/02/2019 58* >60 mL/min Final  . GFR calc Af Amer 06/02/2019 >60  >60 mL/min Final  . Anion gap 06/02/2019 9  5 - 15 Final   Performed at [facility], [place] [address] [place], [place] [address]  . Prothrombin Time 06/02/2019 12.7  11.4 - 15.2 seconds Final  . INR 06/02/2019 1.0  0.8 - 1.2 Final   Comment: (NOTE)  INR goal varies based on device and disease states. Performed at [facility], [place] [address], [place], [place] [address]   . ABO/RH(D) 06/02/2019 A POS   Final  . Antibody Screen 06/02/2019 NEG   Final  . Sample Expiration 06/02/2019 06/10/2019,2359   Final  . Extend sample reason 06/02/2019    Final                   Value:NO TRANSFUSIONS OR PREGNANCY IN THE PAST 3 MONTHS Performed at [facility], [place] [address] [place], [place] [address]   . MRSA, PCR 06/02/2019 NEGATIVE  NEGATIVE Final  . Staphylococcus aureus 06/02/2019 NEGATIVE  NEGATIVE Final   Comment: (NOTE) The Xpert SA Assay (FDA approved for NASAL specimens in patients 21 years of age and older), is one component of a comprehensive surveillance program. It is not intended to diagnose infection nor to guide or monitor treatment. Performed at [facility], [place] [address], [place], [place] [address]   . Hgb A1c MFr Bld 06/02/2019 6.3* 4.8 - 5.6 % Final   Comment: (NOTE) Pre diabetes:          5.7%-6.4% Diabetes:              >6.4% Glycemic control for   <7.0% adults with diabetes   . Mean Plasma Glucose 06/02/2019 134.11  mg/dL Final   Performed at [place] [address] [place], [place] [address]  . Glucose-Capillary 06/02/2019 144* 70 - 99 mg/dL Final     X-Rays:Dg Pelvis Portable  Result Date: 06/07/2019 CLINICAL DATA:  Right total hip replacement EXAM: PORTABLE PELVIS 1-2 VIEWS COMPARISON:  Earlier same day FINDINGS: Right total hip arthroplasty.  Soft tissue drain in place. Good appearance. Advanced chronic degenerative changes of the left hip as seen previously. IMPRESSION: Good appearance following right total hip arthroplasty. Electronically Signed   By: Nelson Chimes M.D.   On: 06/07/2019 17:08   Dg C-arm 1-60 Min-no Report  Result Date: 06/07/2019 Fluoroscopy was utilized by the requesting physician.  No radiographic interpretation.   Dg Hip Operative Unilat W Or W/o Pelvis Left  Result Date: 06/07/2019 CLINICAL DATA:  64 year old male undergoing right hip arthroplasty. EXAM: OPERATIVE RIGHT HIP (WITH PELVIS IF PERFORMED) 4 VIEWS TECHNIQUE: Fluoroscopic spot image(s) were submitted for interpretation post-operatively. COMPARISON:  None. FINDINGS: 4 intraoperative fluoroscopic spot views of the lower pelvis and right hip. Bipolar hip arthroplasty is depicted on the right side over the course of these images. Hardware appears intact, with normal AP alignment. FLUOROSCOPY TIME:  0 minutes 10 seconds IMPRESSION: Right hip arthroplasty with no adverse features. Electronically Signed   By: Genevie Ann M.D.   On: 06/07/2019 16:43    EKG: Orders placed or performed in visit on 05/17/19  . EKG 12-Lead     Hospital Course: Gavin Hopkins is a 63 y.o. who was admitted to [facility]. They were brought to the operating room on 06/07/2019 and underwent Procedure(s): Walker.  Patient tolerated the procedure well and was later transferred to the recovery room and then to the orthopaedic floor for postoperative care. They were given PO and IV analgesics for pain control following their surgery. They were given 24 hours of postoperative antibiotics of  Anti-infectives (From admission, onward)   Start     Dose/Rate Route Frequency Ordered Stop   06/07/19 2130  ceFAZolin (ANCEF) IVPB 2g/100 mL premix     2  g 200 mL/hr over 30 Minutes Intravenous Every 6 hours 06/07/19 1726 06/08/19 0419   06/07/19 1315   ceFAZolin (ANCEF) IVPB 2g/100 mL premix     2 g 200 mL/hr over 30 Minutes Intravenous On call to O.R. 06/07/19 1258 06/07/19 1520     and started on DVT prophylaxis in the form of Aspirin.   PT and OT were ordered for total joint protocol. Discharge planning consulted to help with postop disposition and equipment needs.  Patient had a good night on the evening of surgery. They started to get up OOB with therapy on POD #0. Pt was seen during rounds and was ready to go home pending progress with therapy. Hemovac drain was pulled without difficulty. He worked with therapy on POD #1 and was meeting his goals. Pt was discharged to home later that day in stable condition.  Diet: Regular diet Activity: WBAT Follow-up: in 2 weeks Disposition: Home Discharged Condition: good   Discharge Instructions    Call MD / Call 911   Complete by: As directed    If you experience chest pain or shortness of breath, CALL 911 and be transported to the hospital emergency room.  If you develope a fever above 101 F, pus (white drainage) or increased drainage or redness at the wound, or calf pain, call your surgeon's office.   Change dressing   Complete by: As directed    You may change your dressing on Wednesday, then change the dressing daily with sterile 4 x 4 inch gauze dressing and paper tape.   Constipation Prevention   Complete by: As directed    Drink plenty of fluids.  Prune juice may be helpful.  You may use a stool softener, such as Colace (over the counter) 100 mg twice a day.  Use MiraLax (over the counter) for constipation as needed.   Diet - low sodium heart healthy   Complete by: As directed    Discharge instructions   Complete by: As directed    ? Dr. Gaynelle Arabian Total Joint Specialist [facility] [address], [place], [place] [address] [phone]  ANTERIOR APPROACH TOTAL HIP REPLACEMENT POSTOPERATIVE DIRECTIONS   Hip Rehabilitation, Guidelines Following Surgery  The  results of a hip operation are greatly improved after range of motion and muscle strengthening exercises. Follow all safety measures which are given to protect your hip. If any of these exercises cause increased pain or swelling in your joint, decrease the amount until you are comfortable again. Then slowly increase the exercises. Call your caregiver if you have problems or questions.   HOME CARE INSTRUCTIONS  Remove items at home which could result in a fall. This includes throw rugs or furniture in walking pathways.  ICE to the affected hip every three hours for 30 minutes at a time and then as needed for pain and swelling.  Continue to use ice on the hip for pain and swelling from surgery. You may notice swelling that will progress down to the foot and ankle.  This is normal after surgery.  Elevate the leg when you are not up walking on it.   Continue to use the breathing machine which will help keep your temperature down.  It is common for your temperature to cycle up and down following surgery, especially at night when you are not up moving around and exerting yourself.  The breathing machine keeps your lungs expanded and your temperature down.  DIET You may resume your previous home diet  once your are discharged from the hospital.  DRESSING / WOUND CARE / SHOWERING You may change your dressing 3-5 days after surgery.  Then change the dressing every day with sterile gauze.  Please use good hand washing techniques before changing the dressing.  Do not use any lotions or creams on the incision until instructed by your surgeon. You may start showering once you are discharged home but do not submerge the incision under water. Just pat the incision dry and apply a dry gauze dressing on daily. Change the surgical dressing daily and reapply a dry dressing each time.  ACTIVITY Walk with your walker as instructed. Use walker as long as suggested by your caregivers. Avoid periods of inactivity such as  sitting longer than an hour when not asleep. This helps prevent blood clots.  You may resume a sexual relationship in one month or when given the OK by your doctor.  You may return to work once you are cleared by your doctor.  Do not drive a car for 6 weeks or until released by you surgeon.  Do not drive while taking narcotics.  WEIGHT BEARING Weight bearing as tolerated with assist device (walker, cane, etc) as directed, use it as long as suggested by your surgeon or therapist, typically at least 4-6 weeks.  POSTOPERATIVE CONSTIPATION PROTOCOL Constipation - defined medically as fewer than three stools per week and severe constipation as less than one stool per week.  One of the most common issues patients have following surgery is constipation.  Even if you have a regular bowel pattern at home, your normal regimen is likely to be disrupted due to multiple reasons following surgery.  Combination of anesthesia, postoperative narcotics, change in appetite and fluid intake all can affect your bowels.  In order to avoid complications following surgery, here are some recommendations in order to help you during your recovery period.  Colace (docusate) - Pick up an over-the-counter form of Colace or another stool softener and take twice a day as long as you are requiring postoperative pain medications.  Take with a full glass of water daily.  If you experience loose stools or diarrhea, hold the colace until you stool forms back up.  If your symptoms do not get better within 1 week or if they get worse, check with your doctor.  Dulcolax (bisacodyl) - Pick up over-the-counter and take as directed by the product packaging as needed to assist with the movement of your bowels.  Take with a full glass of water.  Use this product as needed if not relieved by Colace only.   MiraLax (polyethylene glycol) - Pick up over-the-counter to have on hand.  MiraLax is a solution that will increase the amount of water in  your bowels to assist with bowel movements.  Take as directed and can mix with a glass of water, juice, soda, coffee, or tea.  Take if you go more than two days without a movement. Do not use MiraLax more than once per day. Call your doctor if you are still constipated or irregular after using this medication for 7 days in a row.  If you continue to have problems with postoperative constipation, please contact the office for further assistance and recommendations.  If you experience "the worst abdominal pain ever" or develop nausea or vomiting, please contact the office immediatly for further recommendations for treatment.  ITCHING  If you experience itching with your medications, try taking only a single pain pill, or even half a  pain pill at a time.  You can also use Benadryl over the counter for itching or also to help with sleep.   TED HOSE STOCKINGS Wear the elastic stockings on both legs for three weeks following surgery during the day but you may remove then at night for sleeping.  MEDICATIONS See your medication summary on the "After Visit Summary" that the nursing staff will review with you prior to discharge.  You may have some home medications which will be placed on hold until you complete the course of blood thinner medication.  It is important for you to complete the blood thinner medication as prescribed by your surgeon.  Continue your approved medications as instructed at time of discharge.  PRECAUTIONS If you experience chest pain or shortness of breath - call 911 immediately for transfer to the hospital emergency department.  If you develop a fever greater that 101 F, purulent drainage from wound, increased redness or drainage from wound, foul odor from the wound/dressing, or calf pain - CONTACT YOUR SURGEON.                                                   FOLLOW-UP APPOINTMENTS Make sure you keep all of your appointments after your operation with your surgeon and caregivers.  You should call the office at the above phone number and make an appointment for approximately two weeks after the date of your surgery or on the date instructed by your surgeon outlined in the "After Visit Summary".  RANGE OF MOTION AND STRENGTHENING EXERCISES  These exercises are designed to help you keep full movement of your hip joint. Follow your caregiver's or physical therapist's instructions. Perform all exercises about fifteen times, three times per day or as directed. Exercise both hips, even if you have had only one joint replacement. These exercises can be done on a training (exercise) mat, on the floor, on a table or on a bed. Use whatever works the best and is most comfortable for you. Use music or television while you are exercising so that the exercises are a pleasant break in your day. This will make your life better with the exercises acting as a break in routine you can look forward to.  Lying on your back, slowly slide your foot toward your buttocks, raising your knee up off the floor. Then slowly slide your foot back down until your leg is straight again.  Lying on your back spread your legs as far apart as you can without causing discomfort.  Lying on your side, raise your upper leg and foot straight up from the floor as far as is comfortable. Slowly lower the leg and repeat.  Lying on your back, tighten up the muscle in the front of your thigh (quadriceps muscles). You can do this by keeping your leg straight and trying to raise your heel off the floor. This helps strengthen the largest muscle supporting your knee.  Lying on your back, tighten up the muscles of your buttocks both with the legs straight and with the knee bent at a comfortable angle while keeping your heel on the floor.   IF YOU ARE TRANSFERRED TO A SKILLED REHAB FACILITY If the patient is transferred to a skilled rehab facility following release from the hospital, a list of the current medications will be sent to the  facility  for the patient to continue.  When discharged from the skilled rehab facility, please have the facility set up the patient's [place] prior to being released. Also, the skilled facility will be responsible for providing the patient with their medications at time of release from the facility to include their pain medication, the muscle relaxants, and their blood thinner medication. If the patient is still at the rehab facility at time of the two week follow up appointment, the skilled rehab facility will also need to assist the patient in arranging follow up appointment in our office and any transportation needs.  MAKE SURE YOU:  Understand these instructions.  Get help right away if you are not doing well or get worse.    Pick up stool softner and laxative for home use following surgery while on pain medications. Do not submerge incision under water. Please use good hand washing techniques while changing dressing each day. May shower starting three days after surgery. Please use a clean towel to pat the incision dry following showers. Continue to use ice for pain and swelling after surgery. Do not use any lotions or creams on the incision until instructed by your surgeon.   Do not sit on low chairs, stoools or toilet seats, as it may be difficult to get up from low surfaces   Complete by: As directed    Driving restrictions   Complete by: As directed    No driving for two weeks   TED hose   Complete by: As directed    Use stockings (TED hose) for three weeks on both leg(s).  You may remove them at night for sleeping.   Weight bearing as tolerated   Complete by: As directed      Allergies as of 06/08/2019   No Known Allergies     Medication List    STOP taking these medications   cyclobenzaprine 10 MG tablet Commonly known as: FLEXERIL   HYDROcodone-acetaminophen 7.5-325 MG tablet Commonly known as: NORCO Replaced by: HYDROcodone-acetaminophen 5-325  MG tablet     TAKE these medications   aspirin 325 MG EC tablet Take 1 tablet (325 mg total) by mouth 2 (two) times daily for 20 days. Take one tablet (325 mg) Aspirin two times a day for three weeks following surgery. Then resume home dose of aspirin 325mg  daily. What changed:   when to take this  additional instructions   hydrochlorothiazide 12.5 MG tablet Commonly known as: HYDRODIURIL Take 12.5 mg by mouth daily.   HYDROcodone-acetaminophen 5-325 MG tablet Commonly known as: NORCO/VICODIN Take 1-2 tablets by mouth every 6 (six) hours as needed for moderate pain (pain score 4-6). Replaces: HYDROcodone-acetaminophen 7.5-325 MG tablet   lovastatin 40 MG tablet Commonly known as: MEVACOR Take 40 mg by mouth at bedtime.   metFORMIN 500 MG tablet Commonly known as: GLUCOPHAGE Take 1,000 mg by mouth daily at 3 pm.   methocarbamol 500 MG tablet Commonly known as: ROBAXIN Take 1 tablet (500 mg total) by mouth every 6 (six) hours as needed for muscle spasms.   metoprolol succinate 100 MG 24 hr tablet Commonly known as: TOPROL-XL Take 50 mg by mouth 2 (two) times daily.   multivitamin with minerals Tabs tablet Take 1 tablet by mouth daily. Centrum Silver for Men 50+   valsartan 320 MG tablet Commonly known as: DIOVAN Take 1 tablet (320 mg total) by mouth daily.   verapamil 240 MG CR tablet Commonly known as: CALAN-SR Take 1 tablet (240 mg  total) by mouth at bedtime.   Vicks BabyRub Oint Apply 1 application topically at bedtime.            Discharge Care Instructions  (From admission, onward)         Start     Ordered   06/08/19 0000  Weight bearing as tolerated     06/08/19 0818   06/08/19 0000  Change dressing    Comments: You may change your dressing on Wednesday, then change the dressing daily with sterile 4 x 4 inch gauze dressing and paper tape.   06/08/19 0818         Follow-up Information    Gaynelle Arabian, MD. Schedule an appointment as soon as  possible for a visit on 06/22/2019.   Specialty: Orthopedic Surgery Contact information: [address] [place] [address] [place] [address] [phone]           Signed: Griffith Citron, PA-C Orthopedic Surgery 06/14/2019, 7:34 AM

## 2019-06-22 DIAGNOSIS — Z96641 Presence of right artificial hip joint: Secondary | ICD-10-CM | POA: Insufficient documentation

## 2019-06-23 ENCOUNTER — Other Ambulatory Visit: Payer: Self-pay

## 2019-06-23 MED ORDER — VERAPAMIL HCL ER 240 MG PO TBCR: 240.0000 mg | EXTENDED_RELEASE_TABLET | Freq: Every day | ORAL | 1 refills | Status: DC

## 2019-06-28 ENCOUNTER — Encounter: Payer: Self-pay | Admitting: Family Medicine

## 2019-06-28 ENCOUNTER — Ambulatory Visit (INDEPENDENT_AMBULATORY_CARE_PROVIDER_SITE_OTHER): Payer: Managed Care, Other (non HMO) | Admitting: Family Medicine

## 2019-06-28 ENCOUNTER — Other Ambulatory Visit: Payer: Self-pay

## 2019-06-28 VITALS — BP 128/80 | HR 102 | Ht 68.0 in | Wt 200.0 lb

## 2019-06-28 DIAGNOSIS — Z79891 Long term (current) use of opiate analgesic: Secondary | ICD-10-CM

## 2019-06-28 DIAGNOSIS — G5693 Unspecified mononeuropathy of bilateral upper limbs: Secondary | ICD-10-CM

## 2019-06-28 DIAGNOSIS — I471 Supraventricular tachycardia: Secondary | ICD-10-CM | POA: Insufficient documentation

## 2019-06-28 DIAGNOSIS — I10 Essential (primary) hypertension: Secondary | ICD-10-CM

## 2019-06-28 DIAGNOSIS — N1831 Chronic kidney disease, stage 3a: Secondary | ICD-10-CM | POA: Diagnosis not present

## 2019-06-28 DIAGNOSIS — Z789 Other specified health status: Secondary | ICD-10-CM

## 2019-06-28 DIAGNOSIS — I498 Other specified cardiac arrhythmias: Secondary | ICD-10-CM | POA: Diagnosis not present

## 2019-06-28 DIAGNOSIS — Z23 Encounter for immunization: Secondary | ICD-10-CM | POA: Diagnosis not present

## 2019-06-28 DIAGNOSIS — E1121 Type 2 diabetes mellitus with diabetic nephropathy: Secondary | ICD-10-CM | POA: Diagnosis not present

## 2019-06-28 DIAGNOSIS — Z7289 Other problems related to lifestyle: Secondary | ICD-10-CM

## 2019-06-28 DIAGNOSIS — E1122 Type 2 diabetes mellitus with diabetic chronic kidney disease: Secondary | ICD-10-CM | POA: Insufficient documentation

## 2019-06-28 DIAGNOSIS — R7989 Other specified abnormal findings of blood chemistry: Secondary | ICD-10-CM | POA: Diagnosis not present

## 2019-06-28 NOTE — Progress Notes (Signed)
Established Patient Office Visit  Subjective:  Patient ID: Gavin Hopkins, male    DOB: Dec 08, 1955  Age: 63 y.o. MRN: [id number]  CC:  Chief Complaint  Patient presents with  . Establish Care    HPI Gavin Hopkins presents for establishment of care and status post recent right hip replacement 3 weeks ago.  History of hypertension and sinus arrhythmia and is status post recent referral to cardiology for management of these issues.  Currently taking metoprolol, verapamil, valsartan and HCTZ.  Has been taking Mevacor for management of elevated cholesterol.  Has been taking at 1000 mg of Metformin for control of his diabetes.  Hemoglobin A1c drawn 3 weeks ago was 6.3.  Patient has been on Norco 7.5/325 2 daily for as long as he can remember.  He says that this is been prescribed for his hip shoulder pains.  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  He has also been complaining of paresthesias in his third fourth and fifth fingers of both hands.  Denies neck pain.  Patient continues to smoke three-quarter of a pack of cigarettes daily.  Says that he recently quit drinking after his recent hip surgery.  CMP drawn prior to his surgery 3 weeks ago showed elevated liver enzymes.  He is accompanied by his son today.  Past Medical History:  Diagnosis Date  . Arthritis    oa  . Diabetes mellitus without complication ([place])    type 2  . Displaced fracture of lateral malleolus of right fibula, initial encounter for closed fracture 30 yrs ago   and tibula, full cast applied  . Hypertension   . Sleep apnea    could not tolerate cpap    Past Surgical History:  Procedure Laterality Date  . FRACTURE SURGERY    . LEG SURGERY Right    over 30 yrs ago  . NO PAST SURGERIES    . TOTAL HIP ARTHROPLASTY Right 06/07/2019   Procedure: TOTAL HIP ARTHROPLASTY ANTERIOR APPROACH;  Surgeon: Gaynelle Arabian, MD;  Location: [facility];  Service: Orthopedics;  Laterality: Right;  180mn    Family  History  Problem Relation Age of Onset  . Hypertension Mother   . Hyperlipidemia Mother   . Diabetes Mother   . Heart attack Mother        stent  . Stroke Mother   . Heart attack Brother   . Diabetes Brother     Social History   Socioeconomic History  . Marital status: Divorced    Spouse name: Not on file  . Number of children: 1  . Years of education: Not on file  . Highest education level: Not on file  Occupational History  . Not on file  Social Needs  . Financial resource strain: Not on file  . Food insecurity    Worry: Not on file    Inability: Not on file  . Transportation needs    Medical: Not on file    Non-medical: Not on file  Tobacco Use  . Smoking status: Current Every Day Smoker    Packs/day: 0.75    Years: 15.00    Pack years: 11.25    Types: Cigarettes  . Smokeless tobacco: Never Used  Substance and Sexual Activity  . Alcohol use: Yes    Comment: 2 drinks per night  . Drug use: Never  . Sexual activity: Not on file  Lifestyle  . Physical activity    Days per week: Not on file  Minutes per session: Not on file  . Stress: Not on file  Relationships  . Social Herbalist on phone: Not on file    Gets together: Not on file    Attends religious service: Not on file    Active member of club or organization: Not on file    Attends meetings of clubs or organizations: Not on file    Relationship status: Not on file  . Intimate partner violence    Fear of current or ex partner: Not on file    Emotionally abused: Not on file    Physically abused: Not on file    Forced sexual activity: Not on file  Other Topics Concern  . Not on file  Social History Narrative  . Not on file    Outpatient Medications Prior to Visit  Medication Sig Dispense Refill  . Aromatic Inhalants (VICKS BABYRUB) OINT Apply 1 application topically at bedtime.     Marland Kitchen aspirin EC 325 MG EC tablet Take 1 tablet (325 mg total) by mouth 2 (two) times daily for 20 days.  Take one tablet (325 mg) Aspirin two times a day for three weeks following surgery. Then resume home dose of aspirin 316m daily. 40 tablet 0  . hydrochlorothiazide (HYDRODIURIL) 12.5 MG tablet Take 12.5 mg by mouth daily.    .Marland KitchenHYDROcodone-acetaminophen (NORCO) 7.5-325 MG tablet Take 1 tablet by mouth 2 (two) times daily as needed.    . lovastatin (MEVACOR) 40 MG tablet Take 40 mg by mouth at bedtime.    . metFORMIN (GLUCOPHAGE) 500 MG tablet Take 1,000 mg by mouth daily at 3 pm.    . methocarbamol (ROBAXIN) 500 MG tablet Take 1 tablet (500 mg total) by mouth every 6 (six) hours as needed for muscle spasms. 40 tablet 0  . metoprolol succinate (TOPROL-XL) 25 MG 24 hr tablet Take 25 mg by mouth daily.    . Multiple Vitamin (MULTIVITAMIN WITH MINERALS) TABS tablet Take 1 tablet by mouth daily. Centrum Silver for Men 50+    . valsartan (DIOVAN) 320 MG tablet Take 1 tablet (320 mg total) by mouth daily. 30 tablet 2  . verapamil (CALAN-SR) 240 MG CR tablet Take 1 tablet (240 mg total) by mouth at bedtime. 90 tablet 1  . amLODipine (NORVASC) 5 MG tablet Take 5 mg by mouth daily.    .Marland KitchenHYDROcodone-acetaminophen (NORCO/VICODIN) 5-325 MG tablet Take 1-2 tablets by mouth every 6 (six) hours as needed for moderate pain (pain score 4-6). 56 tablet 0  . metoprolol succinate (TOPROL-XL) 100 MG 24 hr tablet Take 50 mg by mouth 2 (two) times daily.     No facility-administered medications prior to visit.     No Known Allergies  ROS Review of Systems  Constitutional: Negative for chills, diaphoresis, fatigue, fever and unexpected weight change.  HENT: Negative.   Eyes: Negative for photophobia and visual disturbance.  Respiratory: Negative.   Cardiovascular: Negative.   Gastrointestinal: Negative.   Endocrine: Negative for polyphagia and polyuria.  Genitourinary: Negative.   Musculoskeletal: Positive for arthralgias and gait problem.  Skin: Negative for pallor.  Allergic/Immunologic: Negative for  immunocompromised state.  Neurological: Negative for light-headedness and headaches.  Hematological: Does not bruise/bleed easily.  Psychiatric/Behavioral: Negative.       Objective:    Physical Exam  Constitutional: He is oriented to person, place, and time. He appears well-developed and well-nourished. No distress.  HENT:  Head: Normocephalic and atraumatic.  Right Ear: External ear normal.  Left Ear: External ear normal.  Eyes: Conjunctivae are normal. Right eye exhibits no discharge. Left eye exhibits no discharge. No scleral icterus.  Neck: No JVD present. No tracheal deviation present. No thyromegaly present.  Cardiovascular: Normal rate, regular rhythm and normal heart sounds.  Pulmonary/Chest: Effort normal. No stridor. He has decreased breath sounds.  Musculoskeletal:     Right shoulder: He exhibits normal range of motion, no tenderness and no bony tenderness.     Left shoulder: He exhibits normal range of motion, no tenderness and no bony tenderness.     Right elbow: He exhibits normal range of motion.     Left elbow: He exhibits normal range of motion.     Cervical back: He exhibits normal range of motion and no tenderness.       Back:       Arms:  Lymphadenopathy:    He has no cervical adenopathy.  Neurological: He is alert and oriented to person, place, and time.  Skin: Skin is warm and dry. He is not diaphoretic.  Psychiatric: He has a normal mood and affect. His behavior is normal.    BP 128/80   Pulse (!) 102   Ht _0  (1.727 m)   Wt 200 lb (90.7 kg)   SpO2 96%   BMI 30.41 kg/m  Wt Readings from Last 3 Encounters:  06/28/19 200 lb (90.7 kg)  06/07/19 205 lb 1.6 oz (93 kg)  06/02/19 205 lb 1.6 oz (93 kg)   BP Readings from Last 3 Encounters:  06/28/19 128/80  06/08/19 136/83  06/02/19 (!) 143/87   Guideline developer:  UpToDate (see UpToDate for funding source) Date Released: June 2014  Health Maintenance Due  Topic Date Due  . Hepatitis C  Screening  [birth year]  . FOOT EXAM  09/07/1963  . OPHTHALMOLOGY EXAM  09/07/1963  . HIV Screening  09/06/1968  . TETANUS/TDAP  09/06/1972  . COLONOSCOPY  09/07/2003    There are no preventive care reminders to display for this patient.  No results found for: TSH Lab Results  Component Value Date   WBC 17.0 (H) 06/08/2019   HGB 13.1 06/08/2019   HCT 39.7 06/08/2019   MCV 98.5 06/08/2019   PLT 230 06/08/2019   Lab Results  Component Value Date   NA 135 06/08/2019   K 4.8 06/08/2019   CO2 22 06/08/2019   GLUCOSE 141 (H) 06/08/2019   BUN 20 06/08/2019   CREATININE 1.35 (H) 06/08/2019   BILITOT 0.8 06/02/2019   ALKPHOS 66 06/02/2019   AST 50 (H) 06/02/2019   ALT 65 (H) 06/02/2019   PROT 7.7 06/02/2019   ALBUMIN 4.3 06/02/2019   CALCIUM 9.1 06/08/2019   ANIONGAP 12 06/08/2019   No results found for: CHOL No results found for: HDL No results found for: LDLCALC No results found for: TRIG No results found for: CHOLHDL Lab Results  Component Value Date   HGBA1C 6.3 (H) 06/02/2019      Assessment & Plan:   Problem List Items Addressed This Visit      Cardiovascular and Mediastinum   Essential hypertension - Primary   Relevant Medications   metoprolol succinate (TOPROL-XL) 25 MG 24 hr tablet   Sinus arrhythmia   Relevant Medications   metoprolol succinate (TOPROL-XL) 25 MG 24 hr tablet     Endocrine   Type 2 diabetes mellitus with stage 3a chronic kidney disease, without long-term current use of insulin (HCC)   Relevant Orders   Comp Met (CMET)  Nervous and Auditory   Neuropathy of both upper extremities   Relevant Orders   Ambulatory referral to Sports Medicine     Other   Elevated LFTs   Relevant Orders   Comp Met (CMET)   Gamma GT   Chronic prescription opiate use   Need for pneumococcal vaccination   Relevant Orders   Pneumococcal polysaccharide vaccine 23-valent greater than or equal to 2yo subcutaneous/IM (Completed)   Alcohol use      No  orders of the defined types were placed in this encounter.    Follow-up: Return in about 1 month (around 07/28/2019).   Patient will follow-up with cardiology and I suggested that they manage all of these blood pressure and other cardiac related meds.  We will follow his cholesterol and diabetes here.  Congratulated him on his abstinence from alcohol and encouraged him to maintain that status.  Rechecking LFTs today.  We will start opiate wean in 1 month.  Patient said that he had also been using the hydrocodone for shoulder pain.  Those exams were normal today.  Encouraged tobacco cessation.  Patient refused flu vaccine today but did except a pneumococcal vaccine.  Will need a physical exam at some point.

## 2019-06-29 LAB — COMPREHENSIVE METABOLIC PANEL
ALT: 37 U/L (ref 0–53)
AST: 27 U/L (ref 0–37)
Albumin: 4.2 g/dL (ref 3.5–5.2)
Alkaline Phosphatase: 91 U/L (ref 39–117)
BUN: 22 mg/dL (ref 6–23)
CO2: 26 mEq/L (ref 19–32)
Calcium: 9.6 mg/dL (ref 8.4–10.5)
Chloride: 102 mEq/L (ref 96–112)
Creatinine, Ser: 1.2 mg/dL (ref 0.40–1.50)
GFR: 60.61 mL/min (ref >60.00)
Glucose, Bld: 61 mg/dL — ABNORMAL LOW (ref 70–99)
Potassium: 4.6 mEq/L (ref 3.5–5.1)
Sodium: 136 mEq/L (ref 135–145)
Total Bilirubin: 0.4 mg/dL (ref 0.2–1.2)
Total Protein: 7.5 g/dL (ref 6.0–8.3)

## 2019-06-29 LAB — GAMMA GT: GGT: 83 U/L — ABNORMAL HIGH (ref 7–51)

## 2019-07-12 IMAGING — US US RENAL
1 series · 14 of 25 positions shown · non-contrast
Comparison: None.

CLINICAL DATA: Elevated creatinine

EXAM:
RENAL / URINARY TRACT ULTRASOUND COMPLETE

[Series 1: us renal · 0.23mm/px · 14 of 29 slices shown]
[im 1/29]
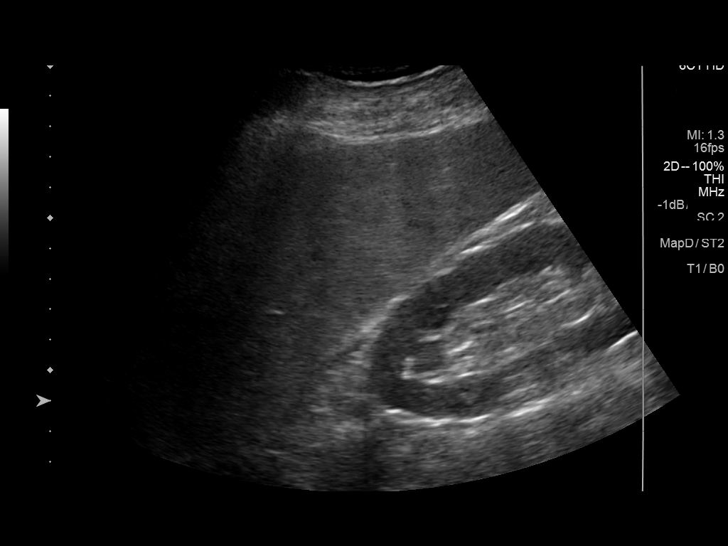
[im 3/29]
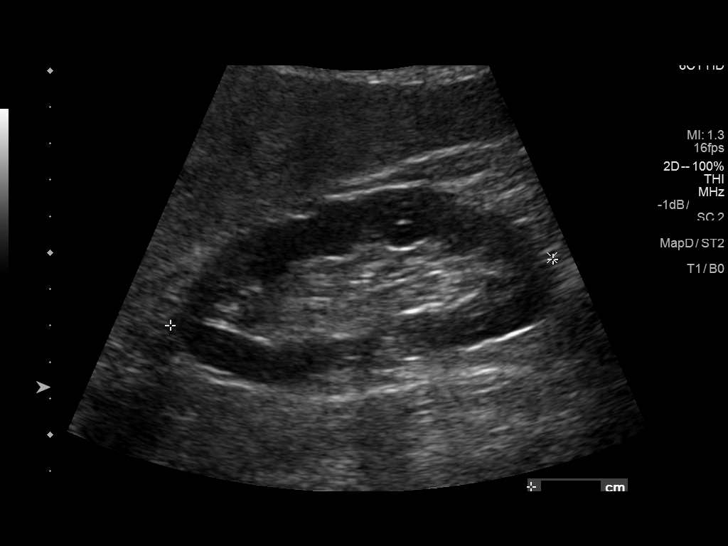
[im 5/29]
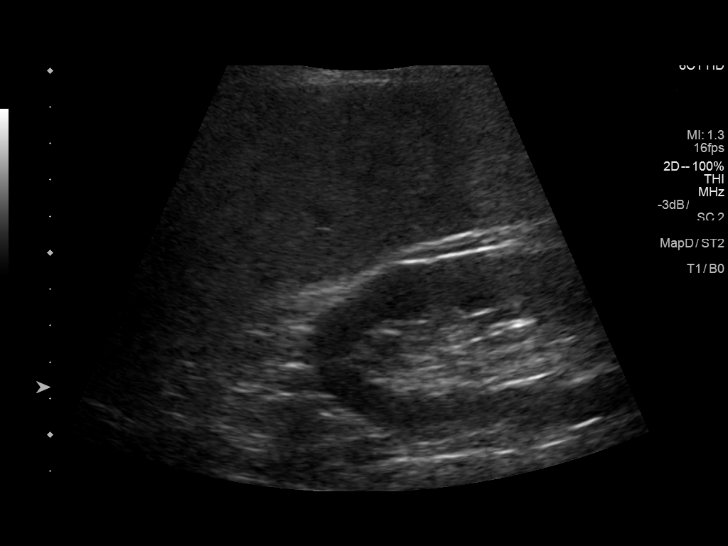
[im 8/29]
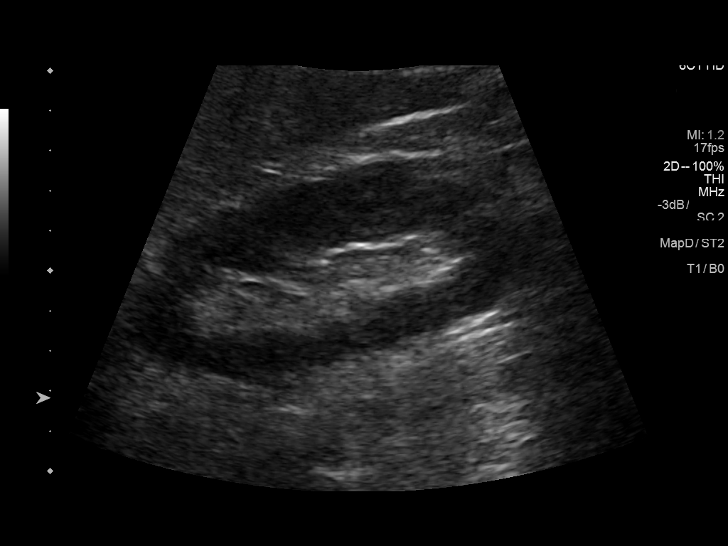
[im 10/29]
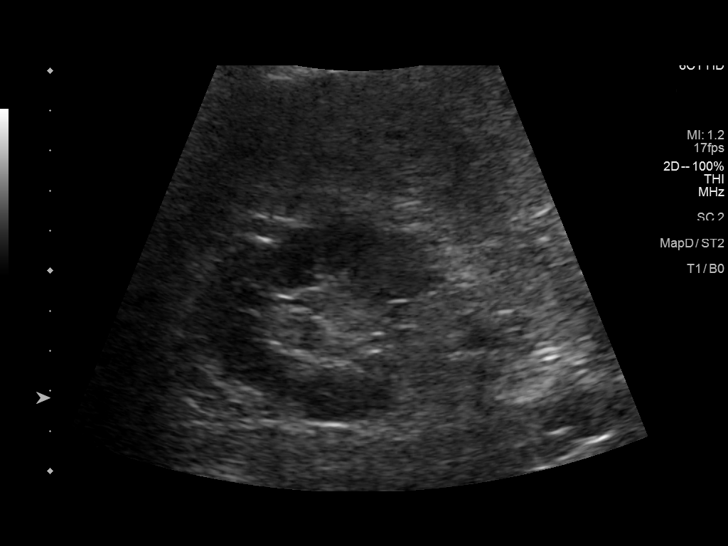
[im 11/29]
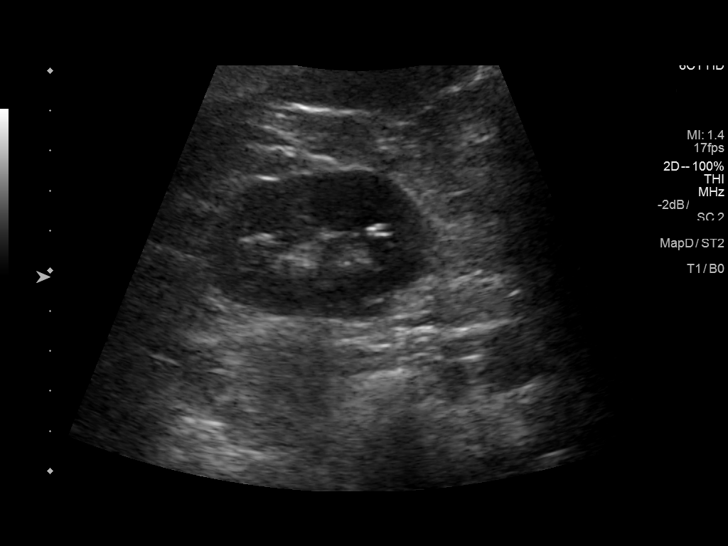
[im 13/29]
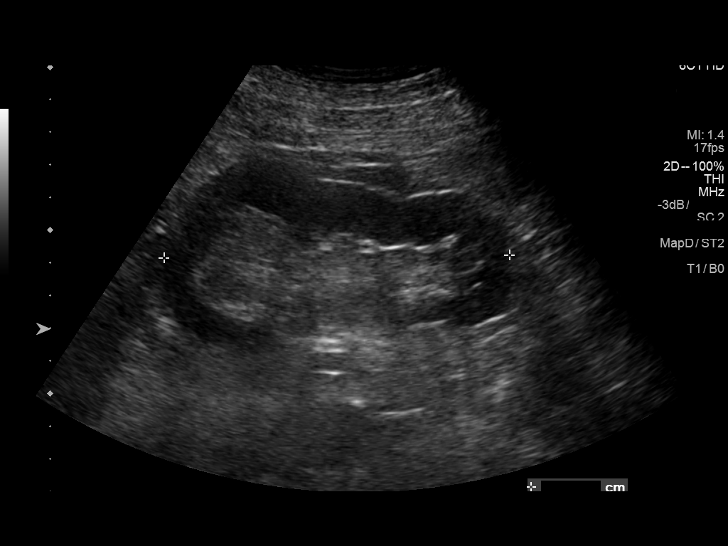
[im 16/29]
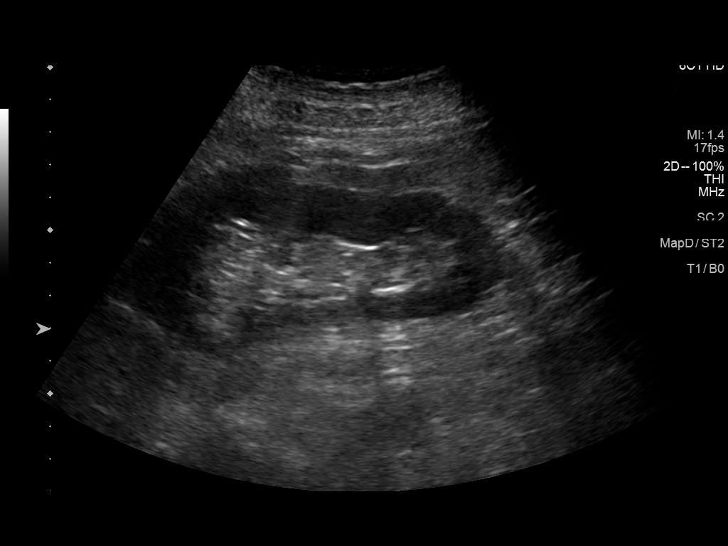
[im 18/29]
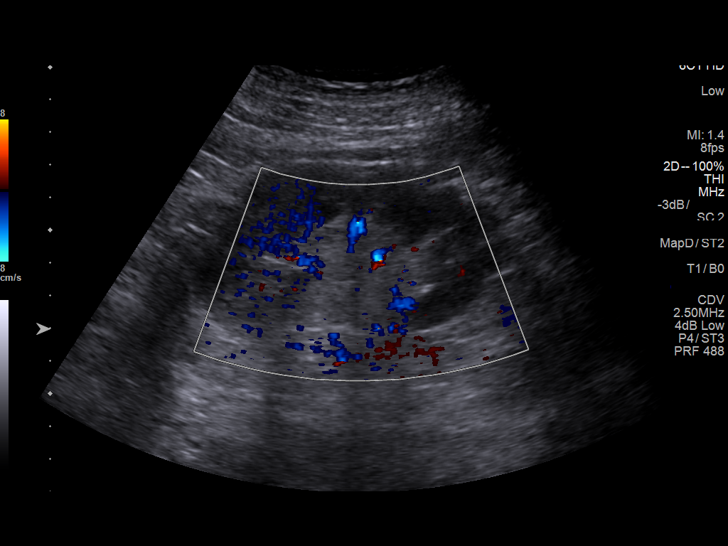
[im 19/29]
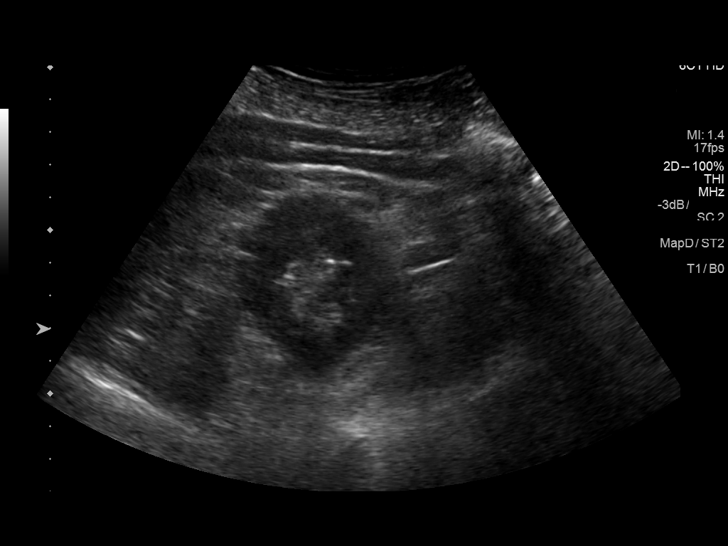
[im 22/29]
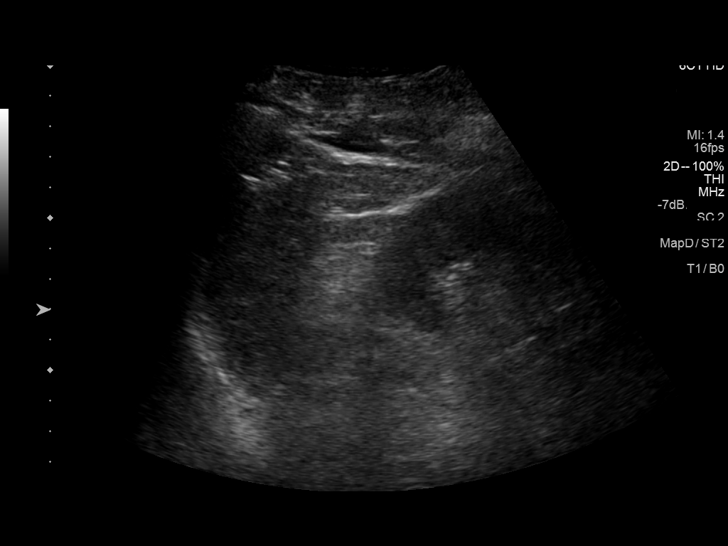
[im 24/29]
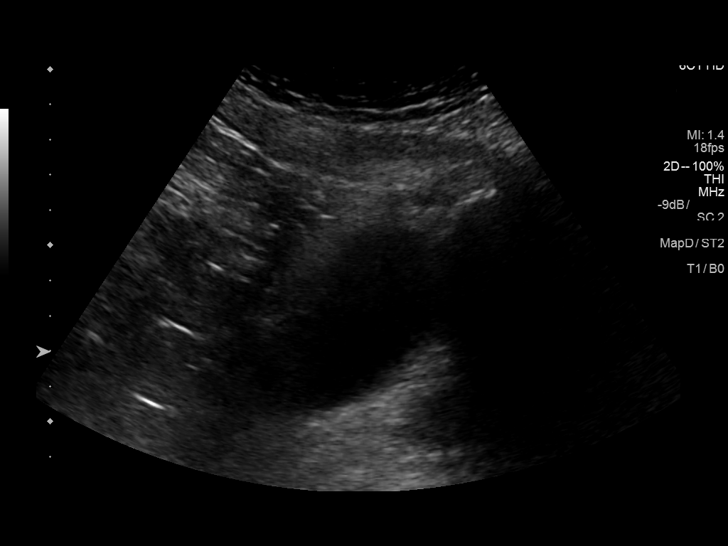
[im 26/29]
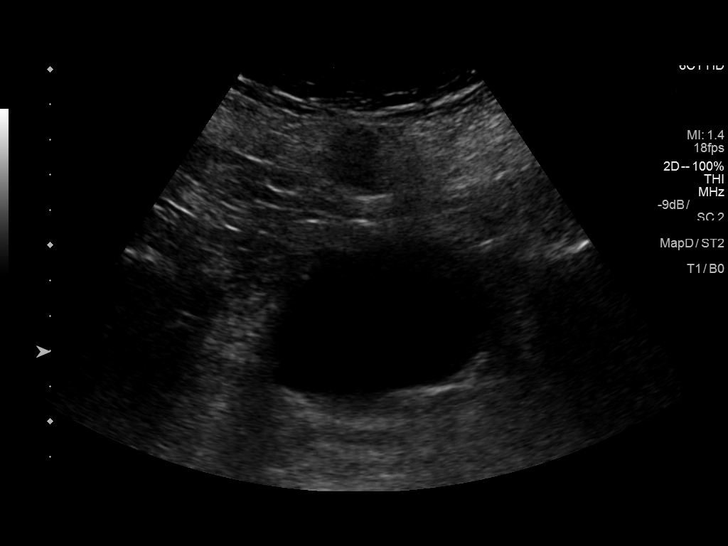
[im 29/29]
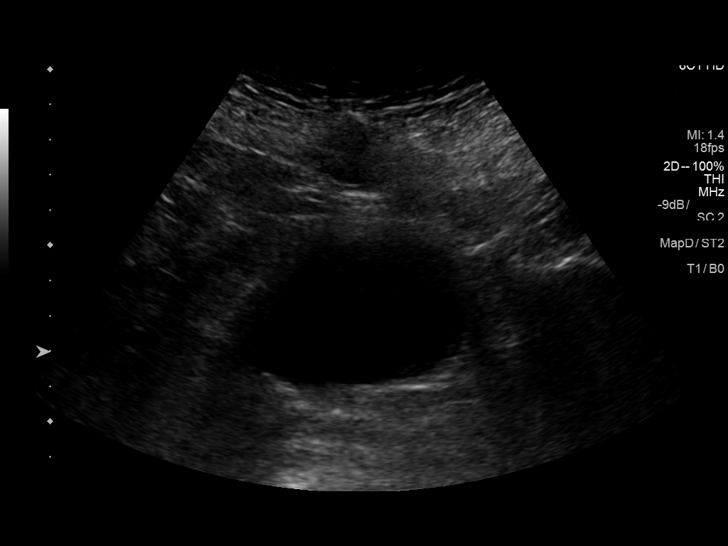

[14 of 25 positions shown; findings below may reference images not displayed]

FINDINGS: Right Kidney:

Length: 10.6 cm. Echogenicity within normal limits. No mass or
hydronephrosis visualized.

Left Kidney:

Length: 10.5 cm. Echogenicity within normal limits. No mass or
hydronephrosis visualized.

Bladder:

Appears normal for degree of bladder distention.
IMPRESSION: Normal renal ultrasound

## 2019-07-13 ENCOUNTER — Ambulatory Visit: Payer: No Typology Code available for payment source | Admitting: Cardiology

## 2019-07-15 ENCOUNTER — Ambulatory Visit (INDEPENDENT_AMBULATORY_CARE_PROVIDER_SITE_OTHER): Payer: No Typology Code available for payment source | Admitting: Cardiology

## 2019-07-15 ENCOUNTER — Encounter: Payer: Self-pay | Admitting: Cardiology

## 2019-07-15 ENCOUNTER — Other Ambulatory Visit: Payer: Self-pay

## 2019-07-15 VITALS — BP 126/85 | HR 89 | Temp 97.5°F | Ht 68.0 in | Wt 202.7 lb

## 2019-07-15 DIAGNOSIS — I10 Essential (primary) hypertension: Secondary | ICD-10-CM

## 2019-07-15 DIAGNOSIS — E782 Mixed hyperlipidemia: Secondary | ICD-10-CM

## 2019-07-15 DIAGNOSIS — Z720 Tobacco use: Secondary | ICD-10-CM | POA: Diagnosis not present

## 2019-07-15 MED ORDER — METOPROLOL SUCCINATE ER 50 MG PO TB24: 50.0000 mg | ORAL_TABLET | Freq: Two times a day (BID) | ORAL | 1 refills | Status: DC

## 2019-07-15 MED ORDER — VALSARTAN 320 MG PO TABS: 320.0000 mg | ORAL_TABLET | Freq: Every day | ORAL | 1 refills | Status: DC

## 2019-07-15 MED ORDER — VERAPAMIL HCL ER 240 MG PO TBCR: 240.0000 mg | EXTENDED_RELEASE_TABLET | Freq: Every day | ORAL | 1 refills | Status: DC

## 2019-07-15 MED ORDER — HYDROCHLOROTHIAZIDE 12.5 MG PO TABS: 12.5000 mg | ORAL_TABLET | Freq: Every day | ORAL | 1 refills | Status: DC

## 2019-07-15 NOTE — Progress Notes (Signed)
Primary Physician:  Libby Maw, MD   Patient ID: Gavin Hopkins, male    DOB: 1955/10/09, 63 y.o.   MRN: [id number]  Subjective:    Chief Complaint  Patient presents with  . Hypertension  . Follow-up    4wk    HPI: Gavin Hopkins  is a 63 y.o. male  with hypertension, type 2 diabetes, OSA not on CPAP due to intolerance, current smoker, and hyperlipidemia, recently evaluated by [place] for atrial tachycardia that was unresponsive to metoprolol; however resolved with addition of Verapamil. Recently underwent right hip replacement on 06/07/19 and now presents for follow up.   Patient reports that he is doing well, he is slowly recovering from his surgery.  He has not yet started physical therapy.  He states his blood pressure and heart rate have been stable.  He is tolerating medications well.  He has recently established with Dr. Ethelene Hal as his PCP.  Patient denies any symptoms, no chest pain, dyspnea on exertion, palpitations, syncope, leg swelling, of symptoms suggestive of claudication or TIA.  He continues to be out of work. He works as a Chemical engineer at [employer].   He is 3/4 a pack per day smoker, but has been able to slightly cut back. He has significantly cut back on alcohol use. No illicit drug use.  Past Medical History:  Diagnosis Date  . Arthritis    oa  . Diabetes mellitus without complication ([place])    type 2  . Displaced fracture of lateral malleolus of right fibula, initial encounter for closed fracture 30 yrs ago   and tibula, full cast applied  . Hypertension   . Sleep apnea    could not tolerate cpap    Past Surgical History:  Procedure Laterality Date  . FRACTURE SURGERY    . LEG SURGERY Right    over 30 yrs ago  . NO PAST SURGERIES    . TOTAL HIP ARTHROPLASTY Right 06/07/2019   Procedure: TOTAL HIP ARTHROPLASTY ANTERIOR APPROACH;  Surgeon: Gaynelle Arabian, MD;  Location: [facility];  Service: Orthopedics;  Laterality: Right;  129min    Social  History   Socioeconomic History  . Marital status: Divorced    Spouse name: Not on file  . Number of children: 1  . Years of education: Not on file  . Highest education level: Not on file  Occupational History  . Not on file  Tobacco Use  . Smoking status: Current Every Day Smoker    Packs/day: 0.75    Years: 15.00    Pack years: 11.25    Types: Cigarettes  . Smokeless tobacco: Never Used  Substance and Sexual Activity  . Alcohol use: Yes    Comment: occasionally throughout the week  . Drug use: Never  . Sexual activity: Not on file  Other Topics Concern  . Not on file  Social History Narrative  . Not on file   Social Determinants of Health   Financial Resource Strain:   . Difficulty of Paying Living Expenses: Not on file  Food Insecurity:   . Worried About Charity fundraiser in the Last Year: Not on file  . Ran Out of Food in the Last Year: Not on file  Transportation Needs:   . Lack of Transportation (Medical): Not on file  . Lack of Transportation (Non-Medical): Not on file  Physical Activity:   . Days of Exercise per Week: Not on file  . Minutes of Exercise per Session: Not on  file  Stress:   . Feeling of Stress : Not on file  Social Connections:   . Frequency of Communication with Friends and Family: Not on file  . Frequency of Social Gatherings with Friends and Family: Not on file  . Attends Religious Services: Not on file  . Active Member of Clubs or Organizations: Not on file  . Attends Archivist Meetings: Not on file  . Marital Status: Not on file  Intimate Partner Violence:   . Fear of Current or Ex-Partner: Not on file  . Emotionally Abused: Not on file  . Physically Abused: Not on file  . Sexually Abused: Not on file    Review of Systems  Constitution: Negative for decreased appetite, malaise/fatigue, weight gain and weight loss.  Eyes: Negative for visual disturbance.  Cardiovascular: Negative for chest pain, claudication, dyspnea on  exertion, leg swelling, orthopnea, palpitations and syncope.  Respiratory: Negative for hemoptysis and wheezing.   Endocrine: Negative for cold intolerance and heat intolerance.  Hematologic/Lymphatic: Does not bruise/bleed easily.  Skin: Negative for nail changes.  Musculoskeletal: Positive for joint pain (left hip pain). Negative for muscle weakness and myalgias.  Gastrointestinal: Negative for abdominal pain, change in bowel habit, nausea and vomiting.  Neurological: Negative for difficulty with concentration, dizziness, focal weakness and headaches.  Psychiatric/Behavioral: Negative for altered mental status and suicidal ideas.  All other systems reviewed and are negative.     Objective:  Blood pressure 126/85, pulse 89, temperature (!) 97.5 F (36.4 C), height 5\' 8"  (1.727 m), weight 202 lb 11.2 oz (91.9 kg), SpO2 95 %. Body mass index is 30.82 kg/m.    Physical Exam  Constitutional: He is oriented to person, place, and time. Vital signs are normal. He appears well-developed and well-nourished.  HENT:  Head: Normocephalic and atraumatic.  Cardiovascular: Normal rate, regular rhythm, normal heart sounds and intact distal pulses.  Pulses:      Femoral pulses are 2+ on the right side and 2+ on the left side.      Popliteal pulses are 2+ on the right side and 2+ on the left side.       Dorsalis pedis pulses are 2+ on the right side and 1+ on the left side.       Posterior tibial pulses are 1+ on the right side and 2+ on the left side.  Pulmonary/Chest: Effort normal and breath sounds normal. No accessory muscle usage. No respiratory distress.  Abdominal: Soft. Bowel sounds are normal.  Musculoskeletal:        General: Normal range of motion.     Cervical back: Normal range of motion.  Neurological: He is alert and oriented to person, place, and time.  Skin: Skin is warm and dry.  Vitals reviewed.  Radiology: No results found.  Laboratory examination:    CMP Latest Ref Rng &  Units 06/28/2019 06/08/2019 06/02/2019  Glucose 70 - 99 mg/dL 61(L) 141(H) 100(H)  BUN 6 - 23 mg/dL 22 20 29(H)  Creatinine 0.40 - 1.50 mg/dL 1.20 1.35(H) 1.29(H)  Sodium 135 - 145 mEq/L 136 135 137  Potassium 3.5 - 5.1 mEq/L 4.6 4.8 4.8  Chloride 96 - 112 mEq/L 102 101 104  CO2 19 - 32 mEq/L 26 22 24   Calcium 8.4 - 10.5 mg/dL 9.6 9.1 9.4  Total Protein 6.0 - 8.3 g/dL 7.5 - 7.7  Total Bilirubin 0.2 - 1.2 mg/dL 0.4 - 0.8  Alkaline Phos 39 - 117 U/L 91 - 66  AST 0 -  37 U/L 27 - 50(H)  ALT 0 - 53 U/L 37 - 65(H)   CBC Latest Ref Rng & Units 06/08/2019 06/02/2019 03/29/2019  WBC 4.0 - 10.5 K/uL 17.0(H) 12.7(H) 12.4(H)  Hemoglobin 13.0 - 17.0 g/dL 13.1 15.1 15.4  Hematocrit 39.0 - 52.0 % 39.7 45.7 46.3  Platelets 150 - 400 K/uL 230 254 279   Lipid Panel  No results found for: CHOL, TRIG, HDL, CHOLHDL, VLDL, LDLCALC, LDLDIRECT HEMOGLOBIN A1C Lab Results  Component Value Date   HGBA1C 6.3 (H) 06/02/2019   MPG 134.11 06/02/2019   TSH No results for input(s): TSH in the last 8760 hours.  PRN Meds:. Medications Discontinued During This Encounter  Medication Reason  . hydrochlorothiazide (HYDRODIURIL) 12.5 MG tablet Reorder  . valsartan (DIOVAN) 320 MG tablet Reorder  . verapamil (CALAN-SR) 240 MG CR tablet Reorder  . metoprolol succinate (TOPROL-XL) 100 MG 24 hr tablet Reorder   Current Meds  Medication Sig  . Aromatic Inhalants (VICKS BABYRUB) OINT Apply 1 application topically at bedtime.   Marland Kitchen aspirin 325 MG tablet Take 325 mg by mouth daily.  . hydrochlorothiazide (HYDRODIURIL) 12.5 MG tablet Take 1 tablet (12.5 mg total) by mouth daily.  Marland Kitchen HYDROcodone-acetaminophen (NORCO) 7.5-325 MG tablet Take 1 tablet by mouth 2 (two) times daily as needed.  . lovastatin (MEVACOR) 40 MG tablet Take 40 mg by mouth at bedtime.  . metFORMIN (GLUCOPHAGE) 500 MG tablet Take 1,000 mg by mouth daily at 3 pm.  . methocarbamol (ROBAXIN) 500 MG tablet Take 1 tablet (500 mg total) by mouth every 6 (six)  hours as needed for muscle spasms.  . metoprolol succinate (TOPROL-XL) 50 MG 24 hr tablet Take 1 tablet (50 mg total) by mouth 2 (two) times daily.  . Multiple Vitamin (MULTIVITAMIN WITH MINERALS) TABS tablet Take 1 tablet by mouth daily. Centrum Silver for Men 50+  . valsartan (DIOVAN) 320 MG tablet Take 1 tablet (320 mg total) by mouth daily.  . verapamil (CALAN-SR) 240 MG CR tablet Take 1 tablet (240 mg total) by mouth at bedtime.  . [DISCONTINUED] hydrochlorothiazide (HYDRODIURIL) 12.5 MG tablet Take 12.5 mg by mouth daily.  . [DISCONTINUED] metoprolol succinate (TOPROL-XL) 100 MG 24 hr tablet Take 50 mg by mouth 2 (two) times daily.   . [DISCONTINUED] valsartan (DIOVAN) 320 MG tablet Take 1 tablet (320 mg total) by mouth daily.  . [DISCONTINUED] verapamil (CALAN-SR) 240 MG CR tablet Take 1 tablet (240 mg total) by mouth at bedtime.    Cardiac Studies:   Echocardiogram 05/06/2019:  Normal LV systolic function with EF 55%. Left ventricle cavity is normal in size. Normal global wall motion. Doppler evidence of grade I (impaired) diastolic dysfunction, normal LAP. Calculated EF 55%. Left atrial cavity is normal in size. Lipomatous hypertrophy of the interatrial septum is present. No pericardial effusion. Anterior fat pad noted. The aortic root is mildly dilated at 3.9 cm.  [place] Stress Test 05/10/2019: 1. Resting EKG normal sinus rhythm, left axis deviation, left anterior fascicular block, right bundle branch block.  Low-voltage complexes.  Stress EKG nondiagnostic due to Lexiscan infusion. 2. Nuclear perfusion reveals diaphragmatic attenuation in the inferior wall.  There is no demonstrable ischemia or infarct.  LVEF was 58% without wall motion abnormality.  This is a low risk stress test. No previous exam available for comparison.   Assessment:   Primary hypertension  Tobacco use  Mixed hyperlipidemia  EKG 05/17/2019: Normal sinus rhythm at 89 bpm, rightward axis, right  bundle branch block.  Abnormal EKG.  EKG 04/26/2019: Probable ectopic atrial tachycardia at 114 bpm, left axis deviation, right bundle branch block. Abnormal EKG.   Recommendations:   Patient is currently recovering from hip surgery and is now without any significant complaints.  He reports blood pressure has been well controlled and is well controlled in our office today.  He is tolerating medications well, I have refilled his antihypertensive therapy as requested.  EKG was not performed today, but appears to be in sinus rhythm.  His heart rate is much better controlled with current medications.  We will continue the same.  Discussed the importance of continued primary and secondary prevention measures with controlling diabetes, blood pressure, cholesterol, and his weight.  I have encouraged him to work on weight loss with dietary changes.  He will also need to work on smoking cessation.  Discussed the importance of continued efforts at this.  He has been able to cut back on alcohol use but I have congratulated him on.  I will see him back in 3 months for follow-up, but encouraged him to contact me sooner if needed.   Miquel Dunn, MSN, APRN, FNP-C [facility]. [place] Office: [phone] Fax: [phone]

## 2019-07-19 ENCOUNTER — Encounter: Payer: Self-pay | Admitting: Family Medicine

## 2019-07-19 LAB — HM DIABETES EYE EXAM

## 2019-07-20 ENCOUNTER — Telehealth: Payer: Self-pay | Admitting: Family Medicine

## 2019-07-20 ENCOUNTER — Other Ambulatory Visit: Payer: Self-pay

## 2019-07-20 ENCOUNTER — Ambulatory Visit (INDEPENDENT_AMBULATORY_CARE_PROVIDER_SITE_OTHER): Payer: No Typology Code available for payment source | Admitting: Family Medicine

## 2019-07-20 ENCOUNTER — Encounter: Payer: Self-pay | Admitting: Family Medicine

## 2019-07-20 VITALS — BP 128/80 | HR 90 | Ht 68.0 in | Wt 201.2 lb

## 2019-07-20 DIAGNOSIS — M79602 Pain in left arm: Secondary | ICD-10-CM

## 2019-07-20 DIAGNOSIS — R202 Paresthesia of skin: Secondary | ICD-10-CM | POA: Diagnosis not present

## 2019-07-20 DIAGNOSIS — M79601 Pain in right arm: Secondary | ICD-10-CM | POA: Diagnosis not present

## 2019-07-20 MED ORDER — GABAPENTIN 300 MG PO CAPS: ORAL_CAPSULE | ORAL | 3 refills | Status: DC

## 2019-07-20 NOTE — Patient Instructions (Signed)
Thank you for coming in today. You should hear from Neurology soon about nerve test.  Try gabapentin and advance dose as needed.  Recheck about 1-2 weeks after the nerve test.   I will also work on getting a new CD of your cervical spine MRI sent to me.    Peripheral Neuropathy Peripheral neuropathy is a type of nerve damage. It affects nerves that carry signals between the spinal cord and the arms, legs, and the rest of the body (peripheral nerves). It does not affect nerves in the spinal cord or brain. In peripheral neuropathy, one nerve or a group of nerves may be damaged. Peripheral neuropathy is a broad category that includes many specific nerve disorders, like diabetic neuropathy, hereditary neuropathy, and carpal tunnel syndrome. What are the causes? This condition may be caused by:  Diabetes. This is the most common cause of peripheral neuropathy.  Nerve injury.  Pressure or stress on a nerve that lasts a long time.  Lack (deficiency) of B vitamins. This can result from alcoholism, poor diet, or a restricted diet.  Infections.  Autoimmune diseases, such as rheumatoid arthritis and systemic lupus erythematosus.  Nerve diseases that are passed from parent to child (inherited).  Some medicines, such as cancer medicines (chemotherapy).  Poisonous (toxic) substances, such as lead and mercury.  Too little blood flowing to the legs.  Kidney disease.  Thyroid disease. In some cases, the cause of this condition is not known. What are the signs or symptoms? Symptoms of this condition depend on which of your nerves is damaged. Common symptoms include:  Loss of feeling (numbness) in the feet, hands, or both.  Tingling in the feet, hands, or both.  Burning pain.  Very sensitive skin.  Weakness.  Not being able to move a part of the body (paralysis).  Muscle twitching.  Clumsiness or poor coordination.  Loss of balance.  Not being able to control your  bladder.  Feeling dizzy.  Sexual problems. How is this diagnosed? Diagnosing and finding the cause of peripheral neuropathy can be difficult. Your health care provider will take your medical history and do a physical exam. A neurological exam will also be done. This involves checking things that are affected by your brain, spinal cord, and nerves (nervous system). For example, your health care provider will check your reflexes, how you move, and what you can feel. You may have other tests, such as:  Blood tests.  Electromyogram (EMG) and nerve conduction tests. These tests check nerve function and how well the nerves are controlling the muscles.  Imaging tests, such as CT scans or MRI to rule out other causes of your symptoms.  Removing a small piece of nerve to be examined in a lab (nerve biopsy). This is rare.  Removing and examining a small amount of the fluid that surrounds the brain and spinal cord (lumbar puncture). This is rare. How is this treated? Treatment for this condition may involve:  Treating the underlying cause of the neuropathy, such as diabetes, kidney disease, or vitamin deficiencies.  Stopping medicines that can cause neuropathy, such as chemotherapy.  Medicine to relieve pain. Medicines may include: ? Prescription or over-the-counter pain medicine. ? Antiseizure medicine. ? Antidepressants. ? Pain-relieving patches that are applied to painful areas of skin.  Surgery to relieve pressure on a nerve or to destroy a nerve that is causing pain.  Physical therapy to help improve movement and balance.  Devices to help you move around (assistive devices). Follow these instructions  at home: Medicines  Take over-the-counter and prescription medicines only as told by your health care provider. Do not take any other medicines without first asking your health care provider.  Do not drive or use heavy machinery while taking prescription pain  medicine. Lifestyle   Do not use any products that contain nicotine or tobacco, such as cigarettes and e-cigarettes. Smoking keeps blood from reaching damaged nerves. If you need help quitting, ask your health care provider.  Avoid or limit alcohol. Too much alcohol can cause a vitamin B deficiency, and vitamin B is needed for healthy nerves.  Eat a healthy diet. This includes: ? Eating foods that are high in fiber, such as fresh fruits and vegetables, whole grains, and beans. ? Limiting foods that are high in fat and processed sugars, such as fried or sweet foods. General instructions   If you have diabetes, work closely with your health care provider to keep your blood sugar under control.  If you have numbness in your feet: ? Check every day for signs of injury or infection. Watch for redness, warmth, and swelling. ? Wear padded socks and comfortable shoes. These help protect your feet.  Develop a good support system. Living with peripheral neuropathy can be stressful. Consider talking with a mental health specialist or joining a support group.  Use assistive devices and attend physical therapy as told by your health care provider. This may include using a walker or a cane.  Keep all follow-up visits as told by your health care provider. This is important. Contact a health care provider if:  You have new signs or symptoms of peripheral neuropathy.  You are struggling emotionally from dealing with peripheral neuropathy.  Your pain is not well-controlled. Get help right away if:  You have an injury or infection that is not healing normally.  You develop new weakness in an arm or leg.  You fall frequently. Summary  Peripheral neuropathy is when the nerves in the arms, or legs are damaged, resulting in numbness, weakness, or pain.  There are many causes of peripheral neuropathy, including diabetes, pinched nerves, vitamin deficiencies, autoimmune disease, and hereditary  conditions.  Diagnosing and finding the cause of peripheral neuropathy can be difficult. Your health care provider will take your medical history, do a physical exam, and do tests, including blood tests and nerve function tests.  Treatment involves treating the underlying cause of the neuropathy and taking medicines to help control pain. Physical therapy and assistive devices may also help. This information is not intended to replace advice given to you by your health care provider. Make sure you discuss any questions you have with your health care provider. Document Released: 07/05/2002 Document Revised: 06/27/2017 Document Reviewed: 09/23/2016 Elsevier Patient Education  2020 [employer].

## 2019-07-20 NOTE — Telephone Encounter (Signed)
Left message with [facility] [phone]) for a copy of the patients past MRI per Dr Georgina Snell. Gave our address and call back number if needed.

## 2019-07-20 NOTE — Progress Notes (Signed)
Subjective:    I'm seeing this patient as a consultation for:  Dr. Ethelene Hal  CC: B hand pain  I, Wendy Poet, LAT, ATC, am serving as scribe for Dr. Lynne Leader.  HPI: Pt is a 63 y/o male presenting w/ c/o B hand  paresthesias, particularly in fingers 3-5 x 7-8 months w/ no known MOI.  Pt denies any loss of motion in his hands or loss of grip strength.  He also denies any neck pain.  He states that he feels increased pressure at the tips of fingers 3-5.  Pt reports that his symptoms aren't as bad at night compared to during the day.  He did have cervical MRI in July 2020 which showed significant spinal stenosis with cord compression.  He denies significant weakness.  He denies ever having an EMG or nerve conduction study.  He is not had any specific treatment for this yet.  Past medical history, Surgical history, Family history not pertinant except as noted below, Social history, Allergies, and medications have been entered into the medical record, reviewed, and no changes needed.   Review of Systems: No headache, visual changes, nausea, vomiting, diarrhea, constipation, dizziness, abdominal pain, skin rash, fevers, chills, night sweats, weight loss, swollen lymph nodes, body aches, joint swelling, muscle aches, chest pain, shortness of breath, mood changes, visual or auditory hallucinations.   Objective:    Vitals:   07/20/19 1330  BP: 128/80  Pulse: 90  SpO2: 94%   General: Well Developed, well nourished, and in no acute distress.  Neuro/Psych: Alert and oriented x3, extra-ocular muscles intact, able to move all 4 extremities, sensation grossly intact. Skin: Warm and dry, no rashes noted.  Respiratory: Not using accessory muscles, speaking in full sentences, trachea midline.  Cardiovascular: Pulses palpable, no extremity edema. Abdomen: Does not appear distended. MSK:  C-spine: Range of motion slightly limited extension and right lateral flexion and rotation.  Normal otherwise. Upper  extremity strength is intact and equal bilateral upper extremities.  Reflexes are equal normal bilateral upper extremities.  Patient has subjective decreased sensation to the fingertips on the ulnar side of his hands bilaterally. Elbows bilaterally normal-appearing normal motion nontender negative Tinel's cubital tunnel. Wrist bilaterally normal-appearing normal motion negative Tinel's carpal tunnel and Guyon's canal Hands slightly enlarged MCPs and PIPs throughout.  No significant ulnar deviation.  Normal hand motion.  Normal strength.  Pulses cap refill are intact distally.  Lab and Radiology Results  Acute Interface, Incoming Rad Results - 02/17/2019 10:46 AM EDT EXAMINATION: MRI cervical spine  CLINICAL INDICATION:Bilateral hand numbness  TECHNIQUE: MRI cervical spine without contrast.   COMPARISON:None  FINDINGS:  No significant bone marrow signal abnormality  The craniocervical junction is normal.  There is some increased signal in the cord at the C5-C6 level due to extrinsic compression.  C2-C3: The disc height is preserved. There is bilateral facet arthritis, worse on the right. There is moderate stenosis of the right neural foramen  C3-C4: There is mild disc bulge. There is bilateral facet arthritis, severe on the right. 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 There is moderate to severe spinal stenosis with slight deformity of the cord. There is severe right neuroforaminal stenosis  and moderate left neuroforaminal stenosis.  C4-C5: There is mild degenerative disc disease with a broad-based disc bulge. The disc effaces the thecal sac but does not compress the cord. There is mild bilateral facet arthritis. There is moderate spinal stenosis and moderate bilateral neuroforaminal  stenosis.  C5-C6: There is  central protrusion of the disc with severe spinal stenosis and compression of the cord and increased signal in the cord. There is facet arthritis on the right. There is  moderate bilateral neuroforaminal stenosis.  C6-C7: There is a mild disc bulge. The disc effaces the thecal sac without compression of the cord, moderate spinal stenosis. There is moderate bilateral neuroforaminal stenosis from lateral extension of the disc with uncovertebral osteophytes.  C7-T1: Normal   IMPRESSION:    Cervical spondylosis.   There is moderate to severe spinal stenosis at C3-C4, moderate spinal stenosis at C4-C5, severe spinal stenosis at C5-C6 with compression of the cord and increased signal in the cord, and moderate spinal stenosis at C6-7.   Electronically Signed by: Michael Boston  Impression and Recommendations:    Assessment and Plan: 63 y.o. male with paresthesias ulnar hands bilaterally.  Distribution of paresthesias is third fourth and fifth digits which does not correspond to typical cervical nerve root distribution or median or ulnar nerve distribution.  Certainly he has risks for cervical radiculopathy with MRI findings outlined above as well as peripheral neuropathy with history of diabetes.  Given his symptoms are ongoing and bothersome we will proceed with EMG/nerve conduction study to further elucidate cause of paresthesia.  We will treat empirically currently with trial of gabapentin.  Recheck back with me a few weeks after nerve conduction study.Marland Kitchen  PDMP not reviewed this encounter. Orders Placed This Encounter  Procedures  . Ambulatory referral to Neurology    Referral Priority:   Routine    Referral Type:   Consultation    Referral Reason:   Specialty Services Required    Requested Specialty:   Neurology    Number of Visits Requested:   1  . NCV with EMG(electromyography)    Standing Status:   Future    Standing Expiration Date:   07/19/2020    Order Specific Question:   Where should this test be performed?    Answer:   [facility]   Meds ordered this encounter  Medications  . gabapentin (NEURONTIN) 300 MG capsule    Sig: One tab PO qHS for a week,  then BID for a week, then TID. May double weekly to a max of 3,600mg /day    Dispense:  180 capsule    Refill:  3    Discussed warning signs or symptoms. Please see discharge instructions. Patient expresses understanding.  The above documentation has been reviewed and is accurate and complete Lynne Leader

## 2019-07-27 ENCOUNTER — Encounter: Payer: Self-pay | Admitting: Family Medicine

## 2019-08-02 ENCOUNTER — Encounter: Payer: Self-pay | Admitting: Family Medicine

## 2019-08-02 ENCOUNTER — Ambulatory Visit (INDEPENDENT_AMBULATORY_CARE_PROVIDER_SITE_OTHER): Payer: No Typology Code available for payment source | Admitting: Family Medicine

## 2019-08-02 ENCOUNTER — Other Ambulatory Visit: Payer: Self-pay

## 2019-08-02 VITALS — BP 144/88 | HR 80 | Temp 98.6°F | Ht 68.0 in | Wt 203.0 lb

## 2019-08-02 DIAGNOSIS — Z7289 Other problems related to lifestyle: Secondary | ICD-10-CM | POA: Diagnosis not present

## 2019-08-02 DIAGNOSIS — F1010 Alcohol abuse, uncomplicated: Secondary | ICD-10-CM

## 2019-08-02 DIAGNOSIS — Z79891 Long term (current) use of opiate analgesic: Secondary | ICD-10-CM | POA: Diagnosis not present

## 2019-08-02 DIAGNOSIS — R7989 Other specified abnormal findings of blood chemistry: Secondary | ICD-10-CM

## 2019-08-02 DIAGNOSIS — G5693 Unspecified mononeuropathy of bilateral upper limbs: Secondary | ICD-10-CM | POA: Diagnosis not present

## 2019-08-02 DIAGNOSIS — M1611 Unilateral primary osteoarthritis, right hip: Secondary | ICD-10-CM

## 2019-08-02 DIAGNOSIS — Z789 Other specified health status: Secondary | ICD-10-CM

## 2019-08-02 DIAGNOSIS — K760 Fatty (change of) liver, not elsewhere classified: Secondary | ICD-10-CM

## 2019-08-02 LAB — COMPREHENSIVE METABOLIC PANEL
ALT: 43 U/L (ref 0–53)
AST: 28 U/L (ref 0–37)
Albumin: 4.3 g/dL (ref 3.5–5.2)
Alkaline Phosphatase: 75 U/L (ref 39–117)
BUN: 16 mg/dL (ref 6–23)
CO2: 27 mEq/L (ref 19–32)
Calcium: 9.9 mg/dL (ref 8.4–10.5)
Chloride: 101 mEq/L (ref 96–112)
Creatinine, Ser: 1.19 mg/dL (ref 0.40–1.50)
GFR: 61.18 mL/min (ref >60.00)
Glucose, Bld: 100 mg/dL — ABNORMAL HIGH (ref 70–99)
Potassium: 4.4 mEq/L (ref 3.5–5.1)
Sodium: 139 mEq/L (ref 135–145)
Total Bilirubin: 0.4 mg/dL (ref 0.2–1.2)
Total Protein: 7 g/dL (ref 6.0–8.3)

## 2019-08-02 LAB — GAMMA GT: GGT: 131 U/L — ABNORMAL HIGH (ref 7–51)

## 2019-08-02 MED ORDER — HYDROCODONE-ACETAMINOPHEN 7.5-325 MG PO TABS: ORAL_TABLET | ORAL | 0 refills | Status: DC

## 2019-08-02 NOTE — Progress Notes (Addendum)
Established Patient Office Visit  Subjective:  Patient ID: Gavin Hopkins, male    DOB: Jan 02, 1956  Age: 64 y.o. MRN: [id number]  CC:  Chief Complaint  Patient presents with  . Follow-up    1 month f/u on medications. No concerns at this time, pt would like refill on Hydrocodone     HPI [name] presents for follow-up of his elevated liver enzymes, alcohol usage and refill on his [place] to recover from his hip surgery on 9 November.  Patient tells me that he is expected to be released on the 19th of this month.  He did see sports medicine who started him on gabapentin for the paresthesias in his fingers.  Patient reports consuming 3 alcoholic drinks on New Year's Eve and again on New Year's Day.  He is seeing cardiology for follow-up of his atrial fib and hypertension.  Past Medical History:  Diagnosis Date  . Arthritis    oa  . Diabetes mellitus without complication ([place])    type 2  . Displaced fracture of lateral malleolus of right fibula, initial encounter for closed fracture 30 yrs ago   and tibula, full cast applied  . Hypertension   . Sleep apnea    could not tolerate cpap    Past Surgical History:  Procedure Laterality Date  . FRACTURE SURGERY    . LEG SURGERY Right    over 30 yrs ago  . NO PAST SURGERIES    . 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 Hypertension Mother   . Hyperlipidemia Mother   . Diabetes Mother   . Heart attack Mother        stent  . Stroke Mother   . Heart attack Brother   . Diabetes Brother     Social History   Socioeconomic History  . Marital status: Divorced    Spouse name: Not on file  . Number of children: 1  . Years of education: Not on file  . Highest education level: Not on file  Occupational  History  . Not on file  Tobacco Use  . Smoking status: Current Every Day Smoker    Packs/day: 0.75    Years: 15.00    Pack years: 11.25    Types: Cigarettes  . Smokeless tobacco: Never Used  Substance and Sexual Activity  . Alcohol use: Yes    Comment: occasionally throughout the week  . Drug use: Never  . Sexual activity: Not on file  Other Topics Concern  . Not on file  Social History Narrative  . Not on file   Social Determinants of Health   Financial Resource Strain:   . Difficulty of Paying Living Expenses: Not on file  Food Insecurity:   . Worried About RCharity fundraiserin the Last Year: Not on file  . Ran Out of Food in the Last Year: Not on file  Transportation Needs:   . Lack of Transportation (Medical): Not on file  . Lack of Transportation (Non-Medical): Not on file  Physical Activity:   . Days of Exercise per Week: Not on file  . Minutes of Exercise per Session: Not on file  Stress:   . Feeling of Stress : Not on file  Social Connections:   . Frequency of Communication  with Friends and Family: Not on file  . Frequency of Social Gatherings with Friends and Family: Not on file  . Attends Religious Services: Not on file  . Active Member of Clubs or Organizations: Not on file  . Attends Archivist Meetings: Not on file  . Marital Status: Not on file  Intimate Partner Violence:   . Fear of Current or Ex-Partner: Not on file  . Emotionally Abused: Not on file  . Physically Abused: Not on file  . Sexually Abused: Not on file    Outpatient Medications Prior to Visit  Medication Sig Dispense Refill  . Aromatic Inhalants (VICKS BABYRUB) OINT Apply 1 application topically at bedtime.     Marland Kitchen aspirin 325 MG tablet Take 325 mg by mouth daily.    Marland Kitchen gabapentin (NEURONTIN) 300 MG capsule One tab PO qHS for a week, then BID for a week, then TID. May double weekly to a max of 3,615m/day 180 capsule 3  . hydrochlorothiazide (HYDRODIURIL) 12.5 MG tablet Take 1  tablet (12.5 mg total) by mouth daily. 90 tablet 1  . lovastatin (MEVACOR) 40 MG tablet Take 40 mg by mouth at bedtime.    . metFORMIN (GLUCOPHAGE) 500 MG tablet Take 1,000 mg by mouth daily at 3 pm.    . Multiple Vitamin (MULTIVITAMIN WITH MINERALS) TABS tablet Take 1 tablet by mouth daily. Centrum Silver for Men 50+    . valsartan (DIOVAN) 320 MG tablet Take 1 tablet (320 mg total) by mouth daily. 90 tablet 1  . verapamil (CALAN-SR) 240 MG CR tablet Take 1 tablet (240 mg total) by mouth at bedtime. 90 tablet 1  . HYDROcodone-acetaminophen (NORCO) 7.5-325 MG tablet Take 1 tablet by mouth 2 (two) times daily as needed.    . metoprolol succinate (TOPROL-XL) 50 MG 24 hr tablet Take 1 tablet (50 mg total) by mouth 2 (two) times daily. 90 tablet 1  . methocarbamol (ROBAXIN) 500 MG tablet Take 1 tablet (500 mg total) by mouth every 6 (six) hours as needed for muscle spasms. (Patient not taking: Reported on 07/20/2019) 40 tablet 0   No facility-administered medications prior to visit.    No Known Allergies  ROS Review of Systems  Constitutional: Negative.   Respiratory: Negative.   Cardiovascular: Negative.   Gastrointestinal: Negative.   Endocrine: Negative for polyphagia and polyuria.  Musculoskeletal: Positive for arthralgias.  Skin: Negative for pallor and rash.  Allergic/Immunologic: Negative for immunocompromised state.  Neurological: Negative for speech difficulty and light-headedness.  Psychiatric/Behavioral: Negative.       Objective:    Physical Exam  Constitutional: He is oriented to person, place, and time. He appears well-developed and well-nourished. No distress.  HENT:  Head: Normocephalic and atraumatic.  Right Ear: External ear normal.  Left Ear: External ear normal.  Eyes: Conjunctivae are normal. Right eye exhibits no discharge. Left eye exhibits no discharge. No scleral icterus.  Cardiovascular: Normal rate, regular rhythm and normal heart sounds.    Pulmonary/Chest: Effort normal and breath sounds normal.  Neurological: He is alert and oriented to person, place, and time.  Skin: Skin is warm and dry. He is not diaphoretic.  Psychiatric: He has a normal mood and affect. His behavior is normal.    BP (!) 144/88   Pulse 80   Temp 98.6 F (37 C) (Oral)   Ht '5\' 8"'  (1.727 m)   Wt 203 lb (92.1 kg)   SpO2 96%   BMI 30.87 kg/m  Wt Readings from Last  3 Encounters:  08/02/19 203 lb (92.1 kg)  07/20/19 201 lb 3.2 oz (91.3 kg)  07/15/19 202 lb 11.2 oz (91.9 kg)     Health Maintenance Due  Topic Date Due  . Hepatitis C Screening  12/[birth year]/1953  . FOOT EXAM  09/07/1963  . HIV Screening  09/06/1968  . TETANUS/TDAP  09/06/1972  . COLONOSCOPY  09/07/2003    There are no preventive care reminders to display for this patient.  No results found for: TSH Lab Results  Component Value Date   WBC 17.0 (H) 06/08/2019   HGB 13.1 06/08/2019   HCT 39.7 06/08/2019   MCV 98.5 06/08/2019   PLT 230 06/08/2019   Lab Results  Component Value Date   NA 139 08/02/2019   K 4.4 08/02/2019   CO2 27 08/02/2019   GLUCOSE 100 (H) 08/02/2019   BUN 16 08/02/2019   CREATININE 1.19 08/02/2019   BILITOT 0.4 08/02/2019   ALKPHOS 75 08/02/2019   AST 28 08/02/2019   ALT 43 08/02/2019   PROT 7.0 08/02/2019   ALBUMIN 4.3 08/02/2019   CALCIUM 9.9 08/02/2019   ANIONGAP 12 06/08/2019   GFR 61.18 08/02/2019   No results found for: CHOL No results found for: HDL No results found for: LDLCALC No results found for: TRIG No results found for: CHOLHDL Lab Results  Component Value Date   HGBA1C 6.3 (H) 06/02/2019      Assessment & Plan:   Problem List Items Addressed This Visit      Digestive   Hepatic steatosis     Nervous and Auditory   Neuropathy of both upper extremities     Musculoskeletal and Integument   OA (osteoarthritis) of hip   Relevant Medications   HYDROcodone-acetaminophen (NORCO) 7.5-325 MG tablet     Other   Elevated LFTs    Relevant Orders   Comp Met (CMET) (Completed)   Gamma GT (Completed)   US Abdomen Complete (Completed)   Chronic prescription opiate use   Relevant Medications   HYDROcodone-acetaminophen (NORCO) 7.5-325 MG tablet   Other Relevant Orders   Urine drugs of abuse scrn w alc, routine ([facility] [place] [facility])   Ethyl Glucuronide, Urine   Compliance Drug Analysis, Ur (Completed)   Alcohol use - Primary   Relevant Orders   Urine drugs of abuse scrn w alc, routine ([facility] [place] [facility])   Ethyl Glucuronide, Urine   Compliance Drug Analysis, Ur (Completed)   US Abdomen Complete (Completed)   Alcohol abuse      Meds ordered this encounter  Medications  . HYDROcodone-acetaminophen (NORCO) 7.5-325 MG tablet    Sig: May take one tablet daily.    Dispense:  30 tablet    Refill:  0    Follow-up: Return in about 1 month (around 09/02/2019).  Will start his Norco wean down to 1 tablet daily over the next month.  He is aware that I will be following Ms. alcohol usage with ongoing drug screens.  Advised him to stop alcohol completely and he told me that would not be a problem.  Libby Maw, MD

## 2019-08-04 NOTE — Addendum Note (Signed)
Addended by: Jon Billings on: 08/04/2019 09:57 AM   Modules accepted: Orders

## 2019-08-11 ENCOUNTER — Other Ambulatory Visit: Payer: Self-pay

## 2019-08-11 MED ORDER — METOPROLOL SUCCINATE ER 50 MG PO TB24: 50.0000 mg | ORAL_TABLET | Freq: Two times a day (BID) | ORAL | 1 refills | Status: DC

## 2019-08-12 ENCOUNTER — Other Ambulatory Visit: Payer: Self-pay | Admitting: Cardiology

## 2019-08-12 ENCOUNTER — Other Ambulatory Visit: Payer: Self-pay

## 2019-08-12 ENCOUNTER — Telehealth: Payer: Self-pay

## 2019-08-12 ENCOUNTER — Ambulatory Visit
Admission: RE | Admit: 2019-08-12 | Discharge: 2019-08-12 | Disposition: A | Discharge status: Home / Self Care | Payer: No Typology Code available for payment source | Source: Ambulatory Visit | Attending: Family Medicine | Admitting: Family Medicine

## 2019-08-12 DIAGNOSIS — R7989 Other specified abnormal findings of blood chemistry: Secondary | ICD-10-CM

## 2019-08-12 DIAGNOSIS — Z789 Other specified health status: Secondary | ICD-10-CM

## 2019-08-12 DIAGNOSIS — Z7289 Other problems related to lifestyle: Secondary | ICD-10-CM

## 2019-08-12 MED ORDER — METOPROLOL SUCCINATE ER 100 MG PO TB24: 100.0000 mg | ORAL_TABLET | Freq: Every day | ORAL | 1 refills | Status: DC

## 2019-08-12 NOTE — Telephone Encounter (Signed)
This was because it was metoprolol succinate which is Rx as once daily was sent as 50 mg BID. I sent for 100 mg instead daily and he can break it and take twice daily or take whole once daily.

## 2019-08-12 NOTE — Telephone Encounter (Signed)
Pt pharmacy called and his insurance wont cover the metoprolol 50 mg bid; Can we try something else

## 2019-08-13 NOTE — Telephone Encounter (Signed)
Handled.   

## 2019-08-13 NOTE — Telephone Encounter (Signed)
Handle this

## 2019-08-14 LAB — COMPLIANCE DRUG ANALYSIS, UR

## 2019-08-17 LAB — ETHYL GLUCURONIDE LC/MS/MS
EtG/EtS LC/MS/MS: POSITIVE — AB
Ethyl Glucuronide LC/MS/MS: 1983 ng/mL
Ethyl Glucuronide: POSITIVE — AB
Ethyl Sulfate LC/MS/MS: 1064 ng/mL
Ethyl Sulfate: POSITIVE — AB

## 2019-08-17 LAB — ETHYL GLUCURONIDE, URINE

## 2019-08-17 LAB — SPECIMEN STATUS REPORT

## 2019-08-19 DIAGNOSIS — K760 Fatty (change of) liver, not elsewhere classified: Secondary | ICD-10-CM | POA: Insufficient documentation

## 2019-08-19 DIAGNOSIS — F1010 Alcohol abuse, uncomplicated: Secondary | ICD-10-CM | POA: Insufficient documentation

## 2019-09-01 ENCOUNTER — Other Ambulatory Visit: Payer: Self-pay

## 2019-09-02 ENCOUNTER — Encounter: Payer: Self-pay | Admitting: Family Medicine

## 2019-09-02 ENCOUNTER — Ambulatory Visit (INDEPENDENT_AMBULATORY_CARE_PROVIDER_SITE_OTHER): Payer: No Typology Code available for payment source | Admitting: Family Medicine

## 2019-09-02 VITALS — BP 110/68 | HR 59 | Temp 96.5°F | Ht 68.0 in | Wt 208.0 lb

## 2019-09-02 DIAGNOSIS — M1990 Unspecified osteoarthritis, unspecified site: Secondary | ICD-10-CM | POA: Diagnosis not present

## 2019-09-02 DIAGNOSIS — F1010 Alcohol abuse, uncomplicated: Secondary | ICD-10-CM | POA: Diagnosis not present

## 2019-09-02 DIAGNOSIS — K760 Fatty (change of) liver, not elsewhere classified: Secondary | ICD-10-CM | POA: Diagnosis not present

## 2019-09-02 LAB — HEPATIC FUNCTION PANEL
ALT: 22 U/L (ref 0–53)
AST: 20 U/L (ref 0–37)
Albumin: 4.3 g/dL (ref 3.5–5.2)
Alkaline Phosphatase: 70 U/L (ref 39–117)
Bilirubin, Direct: 0.1 mg/dL (ref 0.0–0.3)
Total Bilirubin: 0.3 mg/dL (ref 0.2–1.2)
Total Protein: 7 g/dL (ref 6.0–8.3)

## 2019-09-02 LAB — GAMMA GT: GGT: 55 U/L — ABNORMAL HIGH (ref 7–51)

## 2019-09-02 MED ORDER — HYDROCODONE-ACETAMINOPHEN 5-325 MG PO TABS: ORAL_TABLET | ORAL | 0 refills | Status: DC

## 2019-09-02 NOTE — Progress Notes (Signed)
Established Patient Office Visit  Subjective:  Patient ID: Gavin Hopkins, male    DOB: August 12, 1955  Age: 64 y.o. MRN: [id number]  CC:  Chief Complaint  Patient presents with  . Follow-up    1 month follow up, no concerns    HPI Gavin Hopkins presents for follow-up of his alcohol abuse, abdominal ultrasound and hydrocodone taper.  Patient assures me that he has stopped drinking since our last visit.  He is back to work at [employer]  Pain has decreased after his hip surgery.  There is some stiffness in his back.  Denies muscle spasms.  Past Medical History:  Diagnosis Date  . Arthritis    oa  . Diabetes mellitus without complication ([place])    type 2  . Displaced fracture of lateral malleolus of right fibula, initial encounter for closed fracture 30 yrs ago   and tibula, full cast applied  . Hypertension   . Sleep apnea    could not tolerate cpap    Past Surgical History:  Procedure Laterality Date  . FRACTURE SURGERY    . LEG SURGERY Right    over 30 yrs ago  . NO PAST SURGERIES    . TOTAL HIP ARTHROPLASTY Right 06/07/2019   Procedure: TOTAL HIP ARTHROPLASTY ANTERIOR APPROACH;  Surgeon: Gaynelle Arabian, MD;  Location: [facility];  Service: Orthopedics;  Laterality: Right;  192min    Family History  Problem Relation Age of Onset  . Hypertension Mother   . Hyperlipidemia Mother   . Diabetes Mother   . Heart attack Mother        stent  . Stroke Mother   . Heart attack Brother   . Diabetes Brother     Social History   Socioeconomic History  . Marital status: Divorced    Spouse name: Not on file  . Number of children: 1  . Years of education: Not on file  . Highest education level: Not on file  Occupational History  . Not on file  Tobacco Use  . Smoking status: Current Every Day Smoker    Packs/day: 0.75    Years: 15.00    Pack years: 11.25    Types: Cigarettes  . Smokeless tobacco: Never Used  Substance and Sexual Activity  . Alcohol use: Not Currently     Comment: Quit in 1/21.   . Drug use: Never  . Sexual activity: Not on file  Other Topics Concern  . Not on file  Social History Narrative  . Not on file   Social Determinants of Health   Financial Resource Strain:   . Difficulty of Paying Living Expenses: Not on file  Food Insecurity:   . Worried About Charity fundraiser in the Last Year: Not on file  . Ran Out of Food in the Last Year: Not on file  Transportation Needs:   . Lack of Transportation (Medical): Not on file  . Lack of Transportation (Non-Medical): Not on file  Physical Activity:   . Days of Exercise per Week: Not on file  . Minutes of Exercise per Session: Not on file  Stress:   . Feeling of Stress : Not on file  Social Connections:   . Frequency of Communication with Friends and Family: Not on file  . Frequency of Social Gatherings with Friends and Family: Not on file  . Attends Religious Services: Not on file  . Active Member of Clubs or Organizations: Not on file  . Attends Archivist Meetings:  Not on file  . Marital Status: Not on file  Intimate Partner Violence:   . Fear of Current or Ex-Partner: Not on file  . Emotionally Abused: Not on file  . Physically Abused: Not on file  . Sexually Abused: Not on file    Outpatient Medications Prior to Visit  Medication Sig Dispense Refill  . Aromatic Inhalants (VICKS BABYRUB) OINT Apply 1 application topically at bedtime.     . gabapentin (NEURONTIN) 300 MG capsule One tab PO qHS for a week, then BID for a week, then TID. May double weekly to a max of 3,600mg /day 180 capsule 3  . hydrochlorothiazide (HYDRODIURIL) 12.5 MG tablet Take 1 tablet (12.5 mg total) by mouth daily. 90 tablet 1  . lovastatin (MEVACOR) 40 MG tablet Take 40 mg by mouth at bedtime.    . metFORMIN (GLUCOPHAGE) 500 MG tablet Take 1,000 mg by mouth daily at 3 pm.    . metoprolol succinate (TOPROL-XL) 100 MG 24 hr tablet Take 1 tablet (100 mg total) by mouth daily. 90 tablet 1  .  Multiple Vitamin (MULTIVITAMIN WITH MINERALS) TABS tablet Take 1 tablet by mouth daily. Centrum Silver for Men 50+    . valsartan (DIOVAN) 320 MG tablet Take 1 tablet (320 mg total) by mouth daily. 90 tablet 1  . verapamil (CALAN-SR) 240 MG CR tablet Take 1 tablet (240 mg total) by mouth at bedtime. 90 tablet 1  . HYDROcodone-acetaminophen (NORCO) 7.5-325 MG tablet May take one tablet daily. 30 tablet 0  . aspirin 325 MG tablet Take 325 mg by mouth daily.    . methocarbamol (ROBAXIN) 500 MG tablet Take 1 tablet (500 mg total) by mouth every 6 (six) hours as needed for muscle spasms. (Patient not taking: Reported on 07/20/2019) 40 tablet 0   No facility-administered medications prior to visit.    No Known Allergies  ROS Review of Systems  Constitutional: Negative.   HENT: Negative.   Respiratory: Negative.   Cardiovascular: Negative.   Musculoskeletal: Positive for arthralgias and back pain.  Neurological: Negative for weakness.  Psychiatric/Behavioral: Negative.       Objective:    Physical Exam  Constitutional: He is oriented to person, place, and time. He appears well-developed and well-nourished. No distress.  HENT:  Head: Normocephalic and atraumatic.  Right Ear: External ear normal.  Left Ear: External ear normal.  Eyes: Conjunctivae are normal. Right eye exhibits no discharge. Left eye exhibits no discharge. No scleral icterus.  Neck: No JVD present. No tracheal deviation present.  Pulmonary/Chest: Effort normal. No stridor.  Neurological: He is alert and oriented to person, place, and time.  Skin: He is not diaphoretic.  Psychiatric: He has a normal mood and affect. His behavior is normal.    BP 110/68   Pulse (!) 59   Temp (!) 96.5 F (35.8 C) (Tympanic)   Ht 5\' 8"  (1.727 m)   Wt 208 lb (94.3 kg)   SpO2 92%   BMI 31.63 kg/m  Wt Readings from Last 3 Encounters:  09/02/19 208 lb (94.3 kg)  08/02/19 203 lb (92.1 kg)  07/20/19 201 lb 3.2 oz (91.3 kg)      Health Maintenance Due  Topic Date Due  . Hepatitis C Screening  April 17, 1956  . FOOT EXAM  09/07/1963  . HIV Screening  09/06/1968  . TETANUS/TDAP  09/06/1972  . COLONOSCOPY  09/07/2003    There are no preventive care reminders to display for this patient.  No results found for: TSH  Lab Results  Component Value Date   WBC 17.0 (H) 06/08/2019   HGB 13.1 06/08/2019   HCT 39.7 06/08/2019   MCV 98.5 06/08/2019   PLT 230 06/08/2019   Lab Results  Component Value Date   NA 139 08/02/2019   K 4.4 08/02/2019   CO2 27 08/02/2019   GLUCOSE 100 (H) 08/02/2019   BUN 16 08/02/2019   CREATININE 1.19 08/02/2019   BILITOT 0.4 08/02/2019   ALKPHOS 75 08/02/2019   AST 28 08/02/2019   ALT 43 08/02/2019   PROT 7.0 08/02/2019   ALBUMIN 4.3 08/02/2019   CALCIUM 9.9 08/02/2019   ANIONGAP 12 06/08/2019   GFR 61.18 08/02/2019   No results found for: CHOL No results found for: HDL No results found for: LDLCALC No results found for: TRIG No results found for: CHOLHDL Lab Results  Component Value Date   HGBA1C 6.3 (H) 06/02/2019      Assessment & Plan:   Problem List Items Addressed This Visit      Digestive   Fatty liver   Relevant Orders   Gamma GT   Hepatic function panel     Other   Alcohol abuse - Primary   Relevant Orders   Gamma GT   Hepatic function panel   Alcohol metabolite (ETG), urine    Other Visit Diagnoses    Osteoarthritis, unspecified osteoarthritis type, unspecified site       Relevant Medications   HYDROcodone-acetaminophen (NORCO) 5-325 MG tablet      Meds ordered this encounter  Medications  . HYDROcodone-acetaminophen (NORCO) 5-325 MG tablet    Sig: Take one pill daily for a week, then one every other day for a week and then one twice weekly for a week and stop.    Dispense:  12 tablet    Refill:  0    Follow-up: Return in about 3 months (around 11/30/2019).    Congratulated him for quitting alcohol.  Hydrocodone taper.  We discussed his  fatty liver.  Stressed the importance of maintaining sobriety and losing weight.  He has bought a pair new shoes and thinks that this will help the pain he is feeling in his other joints.  I agree.  Discussed using weight loss management.  He believes that he can lose the weight on his own. Libby Maw, MD

## 2019-09-03 LAB — ETHYL GLUCURONIDE, URINE: Ethyl Glucuronide Screen, Ur: NEGATIVE ng/mL

## 2019-09-20 ENCOUNTER — Telehealth: Payer: Self-pay | Admitting: Family Medicine

## 2019-09-20 ENCOUNTER — Other Ambulatory Visit: Payer: Self-pay

## 2019-09-20 MED ORDER — LOVASTATIN 40 MG PO TABS: 40.0000 mg | ORAL_TABLET | Freq: Every day | ORAL | 3 refills | Status: DC

## 2019-09-20 NOTE — Telephone Encounter (Signed)
Patient is calling requesting a refill for Lovastatin sent to [facility] in [place]. CB is [phone]

## 2019-09-22 NOTE — Telephone Encounter (Signed)
Patient aware that medication already sent in.

## 2019-09-22 NOTE — Telephone Encounter (Signed)
Patient is calling back to check the status of medication refill. CB is [phone]

## 2019-10-06 ENCOUNTER — Other Ambulatory Visit: Payer: Self-pay | Admitting: Family Medicine

## 2019-10-06 DIAGNOSIS — M1990 Unspecified osteoarthritis, unspecified site: Secondary | ICD-10-CM

## 2019-10-06 NOTE — Telephone Encounter (Signed)
Patient calling for refill on Hydrocodone per patient he has 5 pills left and can wait until Monday when Dr. Ethelene Hal returns for refill. Pending for your approval or denial.

## 2019-10-06 NOTE — Telephone Encounter (Signed)
Patient is requesting a RX refill for hydrocodone. Please call patient.

## 2019-10-11 ENCOUNTER — Telehealth: Payer: Self-pay | Admitting: Family Medicine

## 2019-10-11 DIAGNOSIS — M1990 Unspecified osteoarthritis, unspecified site: Secondary | ICD-10-CM

## 2019-10-11 NOTE — Telephone Encounter (Signed)
Patient calling for refill on pending medication. Last office visit 09/02/19

## 2019-10-11 NOTE — Telephone Encounter (Signed)
Patient is calling and requesting a refill for hydrocodone acetaminophen sent to [facility] in [place]. CB is [phone]

## 2019-10-12 ENCOUNTER — Encounter: Payer: Self-pay | Admitting: Family Medicine

## 2019-10-12 ENCOUNTER — Ambulatory Visit (INDEPENDENT_AMBULATORY_CARE_PROVIDER_SITE_OTHER): Payer: 59 | Admitting: Family Medicine

## 2019-10-12 VITALS — Temp 97.1°F | Ht 68.0 in | Wt 210.0 lb

## 2019-10-12 DIAGNOSIS — Z79891 Long term (current) use of opiate analgesic: Secondary | ICD-10-CM | POA: Diagnosis not present

## 2019-10-12 DIAGNOSIS — M1990 Unspecified osteoarthritis, unspecified site: Secondary | ICD-10-CM

## 2019-10-12 DIAGNOSIS — M545 Low back pain: Secondary | ICD-10-CM

## 2019-10-12 DIAGNOSIS — M1611 Unilateral primary osteoarthritis, right hip: Secondary | ICD-10-CM | POA: Diagnosis not present

## 2019-10-12 DIAGNOSIS — G8929 Other chronic pain: Secondary | ICD-10-CM | POA: Insufficient documentation

## 2019-10-12 MED ORDER — HYDROCODONE-ACETAMINOPHEN 5-325 MG PO TABS: ORAL_TABLET | ORAL | 0 refills | Status: DC

## 2019-10-12 NOTE — Progress Notes (Signed)
Established Patient Office Visit  Subjective:  Patient ID: Gavin Hopkins, male    DOB: 09-Mar-1956  Age: 64 y.o. MRN: [id number]  CC:  Chief Complaint  Patient presents with  . Follow-up    refill on pain meds     HPI Gavin Hopkins presents for follow-up of his chronic pain.  Continues to have ongoing pain in his lower back that is centralized and nonradiating especially when he has been up on his feet as he tends to be during his work day.  He assures me of his abstinence from alcohol.  He tells me that one of the Norco prior to work it gets him through his workday.  Past Medical History:  Diagnosis Date  . Arthritis    oa  . Diabetes mellitus without complication ([place])    type 2  . Displaced fracture of lateral malleolus of right fibula, initial encounter for closed fracture 30 yrs ago   and tibula, full cast applied  . Hypertension   . Sleep apnea    could not tolerate cpap    Past Surgical History:  Procedure Laterality Date  . FRACTURE SURGERY    . LEG SURGERY Right    over 30 yrs ago  . NO PAST SURGERIES    . TOTAL HIP ARTHROPLASTY Right 06/07/2019   Procedure: TOTAL HIP ARTHROPLASTY ANTERIOR APPROACH;  Surgeon: Gaynelle Arabian, MD;  Location: [facility];  Service: Orthopedics;  Laterality: Right;  181min    Family History  Problem Relation Age of Onset  . Hypertension Mother   . Hyperlipidemia Mother   . Diabetes Mother   . Heart attack Mother        stent  . Stroke Mother   . Heart attack Brother   . Diabetes Brother     Social History   Socioeconomic History  . Marital status: Divorced    Spouse name: Not on file  . Number of children: 1  . Years of education: Not on file  . Highest education level: Not on file  Occupational History  . Not on file  Tobacco Use  . Smoking status: Current Every Day Smoker    Packs/day: 0.75    Years: 15.00    Pack years: 11.25    Types: Cigarettes  . Smokeless tobacco: Never Used  Substance and Sexual  Activity  . Alcohol use: Not Currently    Comment: Quit in 1/21.   . Drug use: Never  . Sexual activity: Not on file  Other Topics Concern  . Not on file  Social History Narrative  . Not on file   Social Determinants of Health   Financial Resource Strain:   . Difficulty of Paying Living Expenses:   Food Insecurity:   . Worried About Charity fundraiser in the Last Year:   . Arboriculturist in the Last Year:   Transportation Needs:   . Film/video editor (Medical):   Marland Kitchen Lack of Transportation (Non-Medical):   Physical Activity:   . Days of Exercise per Week:   . Minutes of Exercise per Session:   Stress:   . Feeling of Stress :   Social Connections:   . Frequency of Communication with Friends and Family:   . Frequency of Social Gatherings with Friends and Family:   . Attends Religious Services:   . Active Member of Clubs or Organizations:   . Attends Archivist Meetings:   Marland Kitchen Marital Status:   Intimate Partner Violence:   .  Fear of Current or Ex-Partner:   . Emotionally Abused:   Marland Kitchen Physically Abused:   . Sexually Abused:     Outpatient Medications Prior to Visit  Medication Sig Dispense Refill  . Aromatic Inhalants (VICKS BABYRUB) OINT Apply 1 application topically at bedtime.     Marland Kitchen aspirin 325 MG tablet Take 325 mg by mouth daily.    Marland Kitchen gabapentin (NEURONTIN) 300 MG capsule One tab PO qHS for a week, then BID for a week, then TID. May double weekly to a max of 3,600mg /day 180 capsule 3  . hydrochlorothiazide (HYDRODIURIL) 12.5 MG tablet Take 1 tablet (12.5 mg total) by mouth daily. 90 tablet 1  . lovastatin (MEVACOR) 40 MG tablet Take 1 tablet (40 mg total) by mouth at bedtime. 30 tablet 3  . metFORMIN (GLUCOPHAGE) 500 MG tablet Take 1,000 mg by mouth daily at 3 pm.    . metoprolol succinate (TOPROL-XL) 100 MG 24 hr tablet Take 1 tablet (100 mg total) by mouth daily. 90 tablet 1  . Multiple Vitamin (MULTIVITAMIN WITH MINERALS) TABS tablet Take 1 tablet by  mouth daily. Centrum Silver for Men 50+    . valsartan (DIOVAN) 320 MG tablet Take 1 tablet (320 mg total) by mouth daily. 90 tablet 1  . verapamil (CALAN-SR) 240 MG CR tablet Take 1 tablet (240 mg total) by mouth at bedtime. 90 tablet 1  . HYDROcodone-acetaminophen (NORCO) 5-325 MG tablet Take one pill daily for a week, then one every other day for a week and then one twice weekly for a week and stop. 12 tablet 0  . methocarbamol (ROBAXIN) 500 MG tablet Take 1 tablet (500 mg total) by mouth every 6 (six) hours as needed for muscle spasms. (Patient not taking: Reported on 07/20/2019) 40 tablet 0   No facility-administered medications prior to visit.    No Known Allergies  ROS Review of Systems  Constitutional: Negative.   Respiratory: Negative.   Cardiovascular: Negative.   Gastrointestinal: Negative.   Musculoskeletal: Positive for arthralgias and back pain.  Neurological: Negative for weakness and numbness.  Psychiatric/Behavioral: Negative.       Objective:    Physical Exam  Constitutional: He is oriented to person, place, and time. No distress.  Pulmonary/Chest: Effort normal.  Neurological: He is alert and oriented to person, place, and time.  Psychiatric: He has a normal mood and affect. His behavior is normal.    Temp (!) 97.1 F (36.2 C) (Tympanic)   Ht 5\' 8"  (1.727 m)   Wt 210 lb (95.3 kg)   BMI 31.93 kg/m  Wt Readings from Last 3 Encounters:  10/12/19 210 lb (95.3 kg)  09/02/19 208 lb (94.3 kg)  08/02/19 203 lb (92.1 kg)     Health Maintenance Due  Topic Date Due  . Hepatitis C Screening  Never done  . FOOT EXAM  Never done  . TETANUS/TDAP  Never done  . COLONOSCOPY  Never done    There are no preventive care reminders to display for this patient.  No results found for: TSH Lab Results  Component Value Date   WBC 17.0 (H) 06/08/2019   HGB 13.1 06/08/2019   HCT 39.7 06/08/2019   MCV 98.5 06/08/2019   PLT 230 06/08/2019   Lab Results  Component  Value Date   NA 139 08/02/2019   K 4.4 08/02/2019   CO2 27 08/02/2019   GLUCOSE 100 (H) 08/02/2019   BUN 16 08/02/2019   CREATININE 1.19 08/02/2019   BILITOT 0.3  09/02/2019   ALKPHOS 70 09/02/2019   AST 20 09/02/2019   ALT 22 09/02/2019   PROT 7.0 09/02/2019   ALBUMIN 4.3 09/02/2019   CALCIUM 9.9 08/02/2019   ANIONGAP 12 06/08/2019   GFR 61.18 08/02/2019   No results found for: CHOL No results found for: HDL No results found for: LDLCALC No results found for: TRIG No results found for: CHOLHDL Lab Results  Component Value Date   HGBA1C 6.3 (H) 06/02/2019      Assessment & Plan:   Problem List Items Addressed This Visit      Musculoskeletal and Integument   Primary osteoarthritis of right hip   Relevant Medications   HYDROcodone-acetaminophen (NORCO) 5-325 MG tablet   Osteoarthritis - Primary   Relevant Medications   HYDROcodone-acetaminophen (NORCO) 5-325 MG tablet     Other   Chronic prescription opiate use   Chronic midline low back pain without sciatica   Relevant Medications   HYDROcodone-acetaminophen (NORCO) 5-325 MG tablet      Meds ordered this encounter  Medications  . HYDROcodone-acetaminophen (NORCO) 5-325 MG tablet    Sig: May take one daily before work.    Dispense:  30 tablet    Refill:  0    Fill today.    Follow-up: Return in about 1 month (around 11/12/2019).   With ongoing sobriety I think it will be okay for patient to take a Norco before he goes to work.  He will supplement with Tylenol.  We will follow him closely. Libby Maw, MD   Virtual Visit via Video Note  I connected with Hilda Lias on 10/12/19 at  4:00 PM EDT by a video enabled telemedicine application and verified that I am speaking with the correct person using two identifiers.  Location: Patient: home  Provider:    I discussed the limitations of evaluation and management by telemedicine and the availability of in person appointments. The patient  expressed understanding and agreed to proceed.  History of Present Illness:    Observations/Objective:   Assessment and Plan:   Follow Up Instructions:    I discussed the assessment and treatment plan with the patient. The patient was provided an opportunity to ask questions and all were answered. The patient agreed with the plan and demonstrated an understanding of the instructions.   The patient was advised to call back or seek an in-person evaluation if the symptoms worsen or if the condition fails to improve as anticipated.  I provided 15 minutes of non-face-to-face time during this encounter.  Interactive video and audio telecommunications were attempted between myself and the patient. However they failed due to the patient having technical difficulties or not having access to video capability. We continued and completed with audio only.  Libby Maw, MD

## 2019-10-12 NOTE — Telephone Encounter (Signed)
Patient calling to get an update an refill requesting. Please call patient on status of refill request.

## 2019-10-12 NOTE — Telephone Encounter (Signed)
Patient scheduled for telephone call to evaluate pain medication

## 2019-10-13 ENCOUNTER — Ambulatory Visit: Payer: No Typology Code available for payment source | Admitting: Cardiology

## 2019-11-02 ENCOUNTER — Other Ambulatory Visit: Payer: Self-pay

## 2019-11-02 MED ORDER — METOPROLOL SUCCINATE ER 100 MG PO TB24: 100.0000 mg | ORAL_TABLET | Freq: Every day | ORAL | 1 refills | Status: DC

## 2019-11-03 ENCOUNTER — Other Ambulatory Visit: Payer: Self-pay

## 2019-11-03 MED ORDER — HYDROCHLOROTHIAZIDE 12.5 MG PO TABS: 12.5000 mg | ORAL_TABLET | Freq: Every day | ORAL | 1 refills | Status: DC

## 2019-11-09 ENCOUNTER — Other Ambulatory Visit: Payer: Self-pay

## 2019-11-09 ENCOUNTER — Telehealth: Payer: Self-pay | Admitting: Family Medicine

## 2019-11-09 DIAGNOSIS — M1611 Unilateral primary osteoarthritis, right hip: Secondary | ICD-10-CM

## 2019-11-09 DIAGNOSIS — M1990 Unspecified osteoarthritis, unspecified site: Secondary | ICD-10-CM

## 2019-11-09 DIAGNOSIS — G8929 Other chronic pain: Secondary | ICD-10-CM

## 2019-11-09 NOTE — Telephone Encounter (Signed)
Patient is requesting a refill on his Hydrocone 5mg . Please send to the same pharmacy as before.

## 2019-11-09 NOTE — Telephone Encounter (Signed)
Sent to Dr. Ethelene Hal for refill request.

## 2019-11-09 NOTE — Telephone Encounter (Signed)
Last OV 10/12/19 Last fill 10/12/19  #30/0

## 2019-11-11 MED ORDER — HYDROCODONE-ACETAMINOPHEN 5-325 MG PO TABS: ORAL_TABLET | ORAL | 0 refills | Status: DC

## 2019-11-11 NOTE — Addendum Note (Signed)
Addended by: Leana Gamer on: 11/11/2019 02:35 PM   Modules accepted: Orders

## 2019-11-11 NOTE — Telephone Encounter (Signed)
Spoke with patient who verbally understood per Dr. Ethelene Hal he will not be refilling Hydrocodone at this time. Patient not happy about this and states that he can not go "COLD [place]" without medications. Patient asking if he could speak with another doctor to see if they would fill meds until Dr. Ethelene Hal is back in the office and patient have a virtual visit like he did for his last refill. Please advise

## 2019-11-11 NOTE — Telephone Encounter (Signed)
See note PMP database reviewed today

## 2019-11-11 NOTE — Telephone Encounter (Signed)
Patient is calling to check status of medication. CB is [phone]

## 2019-11-11 NOTE — Telephone Encounter (Signed)
5tabs sent which can be pick up tomorrow 11/12/2019. Last prescription was filled 10/12/2019. Additional refills will be addressed by Dr. Ethelene Hal when he returns to office on Monday.

## 2019-11-11 NOTE — Telephone Encounter (Signed)
Patient verbally understood 5 tabs sent in until Dr. Ethelene Hal is back in the office. Patient would like to schedule a virtual visit with Dr. Ethelene Hal to discuss pain management. Will this be okay to schedule? Please advise.

## 2019-11-16 ENCOUNTER — Encounter: Payer: Self-pay | Admitting: Family Medicine

## 2019-11-16 ENCOUNTER — Telehealth (INDEPENDENT_AMBULATORY_CARE_PROVIDER_SITE_OTHER): Payer: 59 | Admitting: Family Medicine

## 2019-11-16 VITALS — Temp 96.6°F | Wt 211.0 lb

## 2019-11-16 DIAGNOSIS — G8929 Other chronic pain: Secondary | ICD-10-CM | POA: Diagnosis not present

## 2019-11-16 DIAGNOSIS — M1990 Unspecified osteoarthritis, unspecified site: Secondary | ICD-10-CM | POA: Diagnosis not present

## 2019-11-16 DIAGNOSIS — M545 Low back pain: Secondary | ICD-10-CM | POA: Diagnosis not present

## 2019-11-16 MED ORDER — HYDROCODONE-ACETAMINOPHEN 5-325 MG PO TABS: ORAL_TABLET | ORAL | 0 refills | Status: DC

## 2019-11-16 NOTE — Progress Notes (Signed)
Established Patient Office Visit  Subjective:  Patient ID: Gavin Hopkins, male    DOB: [birth year]  Age: 64 y.o. MRN: [id number]  CC:  Chief Complaint  Patient presents with  . Follow-up    follow up on medication, patient would not like to go cold turky and stop pain medication right away.     HPI Gavin Hopkins presents for follow-up on his chronic pain of his lower back and generalized osteoarthritis.  Taking 1 Norco prior to working.  Is working well for him.  He is not drinking alcohol.  He requested a 90-day supply.  Past Medical History:  Diagnosis Date  . Arthritis    oa  . Diabetes mellitus without complication ([place])    type 2  . Displaced fracture of lateral malleolus of right fibula, initial encounter for closed fracture 30 yrs ago   and tibula, full cast applied  . Hypertension   . Sleep apnea    could not tolerate cpap    Past Surgical History:  Procedure Laterality Date  . FRACTURE SURGERY    . LEG SURGERY Right    over 30 yrs ago  . NO PAST SURGERIES    . TOTAL HIP ARTHROPLASTY Right 06/07/2019   Procedure: TOTAL HIP ARTHROPLASTY ANTERIOR APPROACH;  Surgeon: Gaynelle Arabian, MD;  Location: [facility];  Service: Orthopedics;  Laterality: Right;  197min    Family History  Problem Relation Age of Onset  . Hypertension Mother   . Hyperlipidemia Mother   . Diabetes Mother   . Heart attack Mother        stent  . Stroke Mother   . Heart attack Brother   . Diabetes Brother     Social History   Socioeconomic History  . Marital status: Divorced    Spouse name: Not on file  . Number of children: 1  . Years of education: Not on file  . Highest education level: Not on file  Occupational History  . Not on file  Tobacco Use  . Smoking status: Current Every Day Smoker    Packs/day: 0.75    Years: 15.00    Pack years: 11.25    Types: Cigarettes  . Smokeless tobacco: Never Used  Substance and Sexual Activity  . Alcohol use: Not Currently    Comment:  Quit in 1/21.   . Drug use: Never  . Sexual activity: Not on file  Other Topics Concern  . Not on file  Social History Narrative  . Not on file   Social Determinants of Health   Financial Resource Strain:   . Difficulty of Paying Living Expenses:   Food Insecurity:   . Worried About Charity fundraiser in the Last Year:   . Arboriculturist in the Last Year:   Transportation Needs:   . Film/video editor (Medical):   Marland Kitchen Lack of Transportation (Non-Medical):   Physical Activity:   . Days of Exercise per Week:   . Minutes of Exercise per Session:   Stress:   . Feeling of Stress :   Social Connections:   . Frequency of Communication with Friends and Family:   . Frequency of Social Gatherings with Friends and Family:   . Attends Religious Services:   . Active Member of Clubs or Organizations:   . Attends Archivist Meetings:   Marland Kitchen Marital Status:   Intimate Partner Violence:   . Fear of Current or Ex-Partner:   . Emotionally Abused:   .  Physically Abused:   . Sexually Abused:     Outpatient Medications Prior to Visit  Medication Sig Dispense Refill  . Aromatic Inhalants (VICKS BABYRUB) OINT Apply 1 application topically at bedtime.     Marland Kitchen aspirin 325 MG tablet Take 325 mg by mouth daily.    Marland Kitchen gabapentin (NEURONTIN) 300 MG capsule One tab PO qHS for a week, then BID for a week, then TID. May double weekly to a max of 3,600mg /day 180 capsule 3  . hydrochlorothiazide (HYDRODIURIL) 12.5 MG tablet Take 1 tablet (12.5 mg total) by mouth daily. 90 tablet 1  . lovastatin (MEVACOR) 40 MG tablet Take 1 tablet (40 mg total) by mouth at bedtime. 30 tablet 3  . metFORMIN (GLUCOPHAGE) 500 MG tablet Take 1,000 mg by mouth daily at 3 pm.    . metoprolol succinate (TOPROL-XL) 100 MG 24 hr tablet Take 1 tablet (100 mg total) by mouth daily. 90 tablet 1  . Multiple Vitamin (MULTIVITAMIN WITH MINERALS) TABS tablet Take 1 tablet by mouth daily. Centrum Silver for Men 50+    . valsartan  (DIOVAN) 320 MG tablet Take 1 tablet (320 mg total) by mouth daily. 90 tablet 1  . verapamil (CALAN-SR) 240 MG CR tablet Take 1 tablet (240 mg total) by mouth at bedtime. 90 tablet 1  . HYDROcodone-acetaminophen (NORCO) 5-325 MG tablet May take one daily before work. 5 tablet 0  . methocarbamol (ROBAXIN) 500 MG tablet Take 1 tablet (500 mg total) by mouth every 6 (six) hours as needed for muscle spasms. (Patient not taking: Reported on 07/20/2019) 40 tablet 0   No facility-administered medications prior to visit.    No Known Allergies  ROS Review of Systems  Constitutional: Negative.   Respiratory: Negative.   Cardiovascular: Negative.   Gastrointestinal: Negative.   Musculoskeletal: Positive for arthralgias and back pain.  Neurological: Negative.   Psychiatric/Behavioral: Negative.       Objective:    Physical Exam  Constitutional: He is oriented to person, place, and time. He appears well-developed and well-nourished. No distress.  Pulmonary/Chest: Effort normal.  Neurological: He is alert and oriented to person, place, and time.  Skin: He is not diaphoretic.  Psychiatric: He has a normal mood and affect. His behavior is normal.    Temp (!) 96.6 F (35.9 C) (Tympanic) Comment: per pt  Wt 211 lb (95.7 kg) Comment: per pt  BMI 32.08 kg/m  Wt Readings from Last 3 Encounters:  11/16/19 211 lb (95.7 kg)  10/12/19 210 lb (95.3 kg)  09/02/19 208 lb (94.3 kg)     Health Maintenance Due  Topic Date Due  . Hepatitis C Screening  Never done  . FOOT EXAM  Never done  . COVID-19 Vaccine (1) Never done  . TETANUS/TDAP  Never done  . COLONOSCOPY  Never done    There are no preventive care reminders to display for this patient.  No results found for: TSH Lab Results  Component Value Date   WBC 17.0 (H) 06/08/2019   HGB 13.1 06/08/2019   HCT 39.7 06/08/2019   MCV 98.5 06/08/2019   PLT 230 06/08/2019   Lab Results  Component Value Date   NA 139 08/02/2019   K 4.4  08/02/2019   CO2 27 08/02/2019   GLUCOSE 100 (H) 08/02/2019   BUN 16 08/02/2019   CREATININE 1.19 08/02/2019   BILITOT 0.3 09/02/2019   ALKPHOS 70 09/02/2019   AST 20 09/02/2019   ALT 22 09/02/2019   PROT 7.0  09/02/2019   ALBUMIN 4.3 09/02/2019   CALCIUM 9.9 08/02/2019   ANIONGAP 12 06/08/2019   GFR 61.18 08/02/2019   No results found for: CHOL No results found for: HDL No results found for: LDLCALC No results found for: TRIG No results found for: CHOLHDL Lab Results  Component Value Date   HGBA1C 6.3 (H) 06/02/2019      Assessment & Plan:   Problem List Items Addressed This Visit      Musculoskeletal and Integument   Osteoarthritis   Relevant Medications   HYDROcodone-acetaminophen (NORCO) 5-325 MG tablet     Other   Chronic midline low back pain without sciatica - Primary   Relevant Medications   HYDROcodone-acetaminophen (NORCO) 5-325 MG tablet      Meds ordered this encounter  Medications  . HYDROcodone-acetaminophen (NORCO) 5-325 MG tablet    Sig: May take one daily prior to work    Dispense:  90 tablet    Refill:  0    Follow-up: No follow-ups on file.    Libby Maw, MD   Virtual Visit via Video Note  I connected with Hilda Lias on 11/16/19 at 11:30 AM EDT by a video enabled telemedicine application and verified that I am speaking with the correct person using two identifiers.  Location: Patient: home alone.   Provider:    I discussed the limitations of evaluation and management by telemedicine and the availability of in person appointments. The patient expressed understanding and agreed to proceed.  History of Present Illness:    Observations/Objective:   Assessment and Plan:   Follow Up Instructions:    I discussed the assessment and treatment plan with the patient. The patient was provided an opportunity to ask questions and all were answered. The patient agreed with the plan and demonstrated an understanding of  the instructions.   The patient was advised to call back or seek an in-person evaluation if the symptoms worsen or if the condition fails to improve as anticipated.  I provided 20 minutes of non-face-to-face time during this encounter.   Libby Maw, MD Interactive video and audio telecommunications were attempted between myself and the patient. However they failed due to the patient having technical difficulties or not having access to video capability. We continued and completed with audio only.

## 2019-12-24 ENCOUNTER — Telehealth: Payer: Self-pay | Admitting: Family Medicine

## 2019-12-24 NOTE — Telephone Encounter (Signed)
Patient is calling and requesting a refill [facility] [place]. CB is [phone].

## 2019-12-28 ENCOUNTER — Other Ambulatory Visit: Payer: Self-pay

## 2019-12-28 MED ORDER — LOVASTATIN 40 MG PO TABS: 40.0000 mg | ORAL_TABLET | Freq: Every day | ORAL | 3 refills | Status: DC

## 2019-12-28 NOTE — Telephone Encounter (Signed)
Last VV 11/16/19 Last fill 09/20/19  #30/3

## 2019-12-28 NOTE — Telephone Encounter (Signed)
Med fill today.

## 2020-01-11 ENCOUNTER — Telehealth: Payer: Self-pay | Admitting: Family Medicine

## 2020-01-11 NOTE — Telephone Encounter (Signed)
Patient called and wanted to transfer from Dr. Ethelene Hopkins to [place]. Is this ok?

## 2020-01-11 NOTE — Telephone Encounter (Signed)
With increase number of new patient appts, I will recommend he maintains Dr. Ethelene Hal as pcp. Thank you

## 2020-01-11 NOTE — Telephone Encounter (Signed)
Okay with me 

## 2020-01-12 NOTE — Telephone Encounter (Signed)
With the large influx of new patients, I'm not able to accommodate patient.

## 2020-01-12 NOTE — Telephone Encounter (Signed)
Patient is calling and wanted to transfer from Dr. Ethelene Hal to Dr. Bryan Lemma, is this ok?

## 2020-01-27 ENCOUNTER — Encounter: Payer: Self-pay | Admitting: Cardiology

## 2020-01-27 ENCOUNTER — Other Ambulatory Visit: Payer: Self-pay

## 2020-01-27 ENCOUNTER — Ambulatory Visit: Payer: No Typology Code available for payment source | Admitting: Cardiology

## 2020-01-27 VITALS — BP 113/80 | HR 82 | Ht 68.0 in | Wt 207.0 lb

## 2020-01-27 DIAGNOSIS — I10 Essential (primary) hypertension: Secondary | ICD-10-CM

## 2020-01-27 DIAGNOSIS — Z720 Tobacco use: Secondary | ICD-10-CM

## 2020-01-27 DIAGNOSIS — R42 Dizziness and giddiness: Secondary | ICD-10-CM

## 2020-01-27 DIAGNOSIS — E6609 Other obesity due to excess calories: Secondary | ICD-10-CM

## 2020-01-27 DIAGNOSIS — E66811 Obesity, class 1: Secondary | ICD-10-CM

## 2020-01-27 DIAGNOSIS — I4510 Unspecified right bundle-branch block: Secondary | ICD-10-CM

## 2020-01-27 DIAGNOSIS — I471 Supraventricular tachycardia: Secondary | ICD-10-CM

## 2020-01-27 DIAGNOSIS — E782 Mixed hyperlipidemia: Secondary | ICD-10-CM

## 2020-01-27 DIAGNOSIS — I4719 Other supraventricular tachycardia: Secondary | ICD-10-CM

## 2020-01-27 DIAGNOSIS — E1165 Type 2 diabetes mellitus with hyperglycemia: Secondary | ICD-10-CM

## 2020-01-27 MED ORDER — VALSARTAN 320 MG PO TABS: 320.0000 mg | ORAL_TABLET | Freq: Every day | ORAL | 0 refills | Status: DC

## 2020-01-27 NOTE — Progress Notes (Signed)
Gavin Hopkins Date of Birth: 12-Oct-1955 MRN: [id number] Primary Care Provider:Kremer, Mortimer Fries, MD Former Cardiology Providers: Jeri Lager, APRN, FNP-C Primary Cardiologist: Rex Kras, DO, [facility] (established care 01/27/2020)  Date: 01/27/20 Last Visit: 07/15/2019  Chief Complaint  Patient presents with  . Follow-up    3 month  . Hypertension    HPI  Gavin Hopkins is a 64 y.o.  male who presents to the office with a chief complaint of "blood pressure follow up." Patient's past medical history and cardiovascular risk factors include: hypertension, type 2 diabetes, OSA not on CPAP due to intolerance, current smoker, hyperlipidemia, atrial tachycardia, RBBB.   Patient was originally referred to the office in 2020 for evaluation of tachycardia.  He was originally under the care of Jeri Lager, APRN, FNP-C and he is here to reestablish care with myself.  Since last office visit patient states that he is doing well from a cardiovascular standpoint.  No recent hospitalizations or urgent care visits.  Patient states that he continues to be active by working and his full-time job at [employer] and is constantly on his feet and does not have effort related symptoms of chest pain or shortness of breath.  Patient has an upcoming appointment with his primary care physician on February 14, 2020 at which time he chooses to discuss reevaluation of sleep apnea and have his annual blood work to check his hemoglobin A1c and fasting lipid profile.  Unfortunately, patient continues to smoke three fourths of a pack on a daily basis despite multiple office visits during which she is educated on the importance of complete smoking cessation.  Review of systems are positive for dizziness.  When asked what he experiences dizziness he states that at work at times when he has to do over head activities he has symptoms of dizziness.  However no episodes of near syncope or syncope.  Denies prior  history of coronary artery disease, myocardial infarction, congestive heart failure, deep venous thrombosis, pulmonary embolism, stroke, transient ischemic attack.  FUNCTIONAL STATUS: Continues to work full time at [employer]. He is on his feet throughout the day. No structured exercise program or daily routine.    ALLERGIES: No Known Allergies   MEDICATION LIST PRIOR TO VISIT: Current Outpatient Medications on File Prior to Visit  Medication Sig Dispense Refill  . Aromatic Inhalants (VICKS BABYRUB) OINT Apply 1 application topically at bedtime.     Marland Kitchen aspirin 325 MG tablet Take 325 mg by mouth daily.    Marland Kitchen gabapentin (NEURONTIN) 300 MG capsule One tab PO qHS for a week, then BID for a week, then TID. May double weekly to a max of 3,600mg /day 180 capsule 3  . hydrochlorothiazide (HYDRODIURIL) 12.5 MG tablet Take 1 tablet (12.5 mg total) by mouth daily. 90 tablet 1  . HYDROcodone-acetaminophen (NORCO) 5-325 MG tablet May take one daily prior to work 90 tablet 0  . lovastatin (MEVACOR) 40 MG tablet Take 1 tablet (40 mg total) by mouth at bedtime. 30 tablet 3  . metFORMIN (GLUCOPHAGE) 500 MG tablet Take 1,000 mg by mouth daily at 3 pm.    . Multiple Vitamin (MULTIVITAMIN WITH MINERALS) TABS tablet Take 1 tablet by mouth daily. Centrum Silver for Men 50+    . verapamil (CALAN-SR) 240 MG CR tablet Take 1 tablet (240 mg total) by mouth at bedtime. 90 tablet 1  . metoprolol succinate (TOPROL-XL) 100 MG 24 hr tablet Take 1 tablet (100 mg total) by mouth daily. 90 tablet 1  No current facility-administered medications on file prior to visit.    PAST MEDICAL HISTORY: Past Medical History:  Diagnosis Date  . Arthritis    oa  . Diabetes mellitus without complication ([place])    type 2  . Displaced fracture of lateral malleolus of right fibula, initial encounter for closed fracture 30 yrs ago   and tibula, full cast applied  . Hypertension   . Sleep apnea    could not tolerate cpap    PAST  SURGICAL HISTORY: Past Surgical History:  Procedure Laterality Date  . FRACTURE SURGERY    . LEG SURGERY Right    over 30 yrs ago  . NO PAST SURGERIES    . TOTAL HIP ARTHROPLASTY Right 06/07/2019   Procedure: TOTAL HIP ARTHROPLASTY ANTERIOR APPROACH;  Surgeon: Gaynelle Arabian, MD;  Location: [facility];  Service: Orthopedics;  Laterality: Right;  124min    FAMILY HISTORY: The patient's family history includes Diabetes in his brother and mother; Heart attack in his brother and mother; Hyperlipidemia in his mother; Hypertension in his mother; Stroke in his mother.   SOCIAL HISTORY:  The patient  reports that he has been smoking cigarettes. He has a 11.25 pack-year smoking history. He has never used smokeless tobacco. He reports previous alcohol use. He reports that he does not use drugs.  Review of Systems  Constitutional: Negative for chills and fever.  HENT: Negative for hoarse voice and nosebleeds.   Eyes: Negative for discharge, double vision and pain.  Cardiovascular: Negative for chest pain, claudication, dyspnea on exertion, leg swelling, near-syncope, orthopnea, palpitations, paroxysmal nocturnal dyspnea and syncope.  Respiratory: Positive for snoring. Negative for hemoptysis and shortness of breath.   Musculoskeletal: Negative for muscle cramps and myalgias.  Gastrointestinal: Negative for abdominal pain, constipation, diarrhea, hematemesis, hematochezia, melena, nausea and vomiting.  Neurological: Positive for dizziness. Negative for light-headedness.    PHYSICAL EXAM: Vitals with BMI 01/27/2020 11/16/2019 10/12/2019  Height 5\' 8"  - 5\' 8"   Weight 207 lbs 211 lbs 210 lbs  BMI 16.10 - 96.04  Systolic 540 - -  Diastolic 80 - -  Pulse 82 - -    CONSTITUTIONAL: Well-developed and well-nourished. No acute distress.  SKIN: Skin is warm and dry. No rash noted. No cyanosis. No pallor. No jaundice HEAD: Normocephalic and atraumatic.  EYES: No scleral icterus MOUTH/THROAT: Moist oral  membranes.  NECK: No JVD present. No thyromegaly noted. No carotid bruits  LYMPHATIC: No visible cervical adenopathy.  CHEST Normal respiratory effort. No intercostal retractions  LUNGS: Clear to auscultation bilaterally.  No stridor. No wheezes. No rales.  CARDIOVASCULAR: Regular rate and rhythm, positive S1-S2, no murmurs rubs or gallops appreciated ABDOMINAL: Obese, soft, nontender, nondistended, positive bowel sounds in all 4 quadrants.  No apparent ascites.  EXTREMITIES: No peripheral edema  HEMATOLOGIC: No significant bruising NEUROLOGIC: Oriented to person, place, and time. Nonfocal. Normal muscle tone.  PSYCHIATRIC: Normal mood and affect. Normal behavior. Cooperative  CARDIAC DATABASE: EKG: 05/17/2019: Normal sinus rhythm at 89 bpm, rightward axis, right bundle branch block. Abnormal EKG. 04/26/2019: Probable ectopic atrial tachycardia at 114 bpm, left axis deviation, right bundle branch block.  01/27/2020: Normal sinus rhythm, 82 bpm, right bundle branch block, left axis deviation, left anterior fascicular block, right bundle branch block, rare PVCs.  Echocardiogram: 05/06/2019: LVEF 98%, grade 1 diastolic impairment, normal LAP, normal left atrial size, lipomatous hypertrophy of the intra-atrial septum, anterior fat pad, aortic root 3.9 cm.  Stress Testing:  Lexiscan Myoview Stress Test10/06/2019: 1. Resting EKG  normal sinus rhythm, left axis deviation, left anterior fascicular block, right bundle branch block. Low-voltage complexes. Stress EKG nondiagnostic due to Lexiscan infusion. 2. Nuclear perfusion reveals diaphragmatic attenuation in the inferior wall. There is no demonstrable ischemia or infarct. LVEF was 58% without wall motion abnormality. This is a low risk stress test.  Heart Catheterization: None  Carotid duplex: None  LABORATORY DATA: CBC Latest Ref Rng & Units 06/08/2019 06/02/2019 03/29/2019  WBC 4.0 - 10.5 K/uL 17.0(H) 12.7(H) 12.4(H)  Hemoglobin 13.0  - 17.0 g/dL 13.1 15.1 15.4  Hematocrit 39 - 52 % 39.7 45.7 46.3  Platelets 150 - 400 K/uL 230 254 279    CMP Latest Ref Rng & Units 09/02/2019 08/02/2019 06/28/2019  Glucose 70 - 99 mg/dL - 100(H) 61(L)  BUN 6 - 23 mg/dL - 16 22  Creatinine 0.40 - 1.50 mg/dL - 1.19 1.20  Sodium 135 - 145 mEq/L - 139 136  Potassium 3.5 - 5.1 mEq/L - 4.4 4.6  Chloride 96 - 112 mEq/L - 101 102  CO2 19 - 32 mEq/L - 27 26  Calcium 8.4 - 10.5 mg/dL - 9.9 9.6  Total Protein 6.0 - 8.3 g/dL 7.0 7.0 7.5  Total Bilirubin 0.2 - 1.2 mg/dL 0.3 0.4 0.4  Alkaline Phos 39 - 117 U/L 70 75 91  AST 0 - 37 U/L 20 28 27   ALT 0 - 53 U/L 22 43 37    Lipid Panel  No results found for: CHOL, TRIG, HDL, CHOLHDL, VLDL, LDLCALC, LDLDIRECT, LABVLDL  Lab Results  Component Value Date   HGBA1C 6.3 (H) 06/02/2019   HGBA1C 6.3 (H) 03/29/2019   No components found for: NTPROBNP No results found for: TSH  Cardiac Panel (last 3 results) No results for input(s): CKTOTAL, CKMB, TROPONINIHS, RELINDX in the last 72 hours.  IMPRESSION:    ICD-10-CM   1. Benign essential hypertension  I10 EKG 12-Lead    valsartan (DIOVAN) 320 MG tablet  2. Dizziness  R42 PCV CAROTID DUPLEX (BILATERAL)  3. Tobacco use  Z72.0   4. Ectopic atrial tachycardia (HCC)  I47.1   5. Mixed hyperlipidemia  E78.2   6. Type 2 diabetes mellitus with hyperglycemia, with long-term current use of insulin (HCC)  E11.65    Z79.4   7. Class 1 obesity due to excess calories with serious comorbidity and body mass index (BMI) of 31.0 to 31.9 in adult  E66.09    Z68.31   8. RBBB  I45.10      RECOMMENDATIONS: Gavin Hopkins is a 64 y.o. male whose past medical history and cardiovascular risk factors include:  hypertension, type 2 diabetes, OSA not on CPAP due to intolerance, current smoker, hyperlipidemia, atrial tachycardia, RBBB.   Benign essential hypertension:  Currently his blood pressure is at goal.  Continue current medical therapy.  Medications  reconciled.  Refill valsartan.  Educated importance of a low-salt diet.  Patient is informed to call the office if his systolic blood pressures are consistently greater than 140 mmHg.  Dizziness:  Patient states of episodic dizziness at times when he does overhead activities.  We will check carotid duplex to evaluate for subclavian stenosis and carotid artery atherosclerosis.  Active tobacco use: Educated on the importance of complete smoking cessation.  Insulin-dependent diabetes mellitus type 2: Patient will be seen his primary care provider for an annual visit in July 2021 for blood work and further recommendations.  Patient is currently on full dose aspirin for noncardiac causes.  He will discuss it  further with his primary care provider.   FINAL MEDICATION LIST END OF ENCOUNTER: Meds ordered this encounter  Medications  . valsartan (DIOVAN) 320 MG tablet    Sig: Take 1 tablet (320 mg total) by mouth daily.    Dispense:  90 tablet    Refill:  0    Medications Discontinued During This Encounter  Medication Reason  . valsartan (DIOVAN) 320 MG tablet Reorder     Current Outpatient Medications:  .  Aromatic Inhalants (VICKS BABYRUB) OINT, Apply 1 application topically at bedtime. , Disp: , Rfl:  .  aspirin 325 MG tablet, Take 325 mg by mouth daily., Disp: , Rfl:  .  gabapentin (NEURONTIN) 300 MG capsule, One tab PO qHS for a week, then BID for a week, then TID. May double weekly to a max of 3,600mg /day, Disp: 180 capsule, Rfl: 3 .  hydrochlorothiazide (HYDRODIURIL) 12.5 MG tablet, Take 1 tablet (12.5 mg total) by mouth daily., Disp: 90 tablet, Rfl: 1 .  HYDROcodone-acetaminophen (NORCO) 5-325 MG tablet, May take one daily prior to work, Disp: 90 tablet, Rfl: 0 .  lovastatin (MEVACOR) 40 MG tablet, Take 1 tablet (40 mg total) by mouth at bedtime., Disp: 30 tablet, Rfl: 3 .  metFORMIN (GLUCOPHAGE) 500 MG tablet, Take 1,000 mg by mouth daily at 3 pm., Disp: , Rfl:  .  Multiple  Vitamin (MULTIVITAMIN WITH MINERALS) TABS tablet, Take 1 tablet by mouth daily. Centrum Silver for Men 50+, Disp: , Rfl:  .  valsartan (DIOVAN) 320 MG tablet, Take 1 tablet (320 mg total) by mouth daily., Disp: 90 tablet, Rfl: 0 .  verapamil (CALAN-SR) 240 MG CR tablet, Take 1 tablet (240 mg total) by mouth at bedtime., Disp: 90 tablet, Rfl: 1 .  metoprolol succinate (TOPROL-XL) 100 MG 24 hr tablet, Take 1 tablet (100 mg total) by mouth daily., Disp: 90 tablet, Rfl: 1  Orders Placed This Encounter  Procedures  . EKG 12-Lead  . PCV CAROTID DUPLEX (BILATERAL)   --Continue cardiac medications as reconciled in final medication list. --Return in about 6 months (around 07/29/2020) for BP follow up., review test results.. Or sooner if needed. --Continue follow-up with your primary care physician regarding the management of your other chronic comorbid conditions.  Patient's questions and concerns were addressed to his satisfaction. He voices understanding of the instructions provided during this encounter.   Time spent: 35 minutes.  This note was created using a voice recognition software as a result there may be grammatical errors inadvertently enclosed that do not reflect the nature of this encounter. Every attempt is made to correct such errors.  Rex Kras, [place], [facility]  Pager: [phone] Office: [phone]

## 2020-02-02 ENCOUNTER — Telehealth: Payer: Self-pay | Admitting: Family Medicine

## 2020-02-02 NOTE — Telephone Encounter (Signed)
Patient is calling and requesting a refill for metformin sent to [place], please advise. CB is [phone]

## 2020-02-03 ENCOUNTER — Other Ambulatory Visit: Payer: Self-pay

## 2020-02-03 MED ORDER — METFORMIN HCL 500 MG PO TABS: 1000.0000 mg | ORAL_TABLET | Freq: Every day | ORAL | 0 refills | Status: DC

## 2020-02-03 NOTE — Telephone Encounter (Signed)
Rx sent in

## 2020-02-03 NOTE — Telephone Encounter (Signed)
Patient is calling back to check the status of refill request and patient is requesting a call back. CB is [phone]

## 2020-02-10 ENCOUNTER — Telehealth: Payer: Self-pay | Admitting: Family Medicine

## 2020-02-10 ENCOUNTER — Other Ambulatory Visit: Payer: Self-pay

## 2020-02-10 ENCOUNTER — Ambulatory Visit: Payer: No Typology Code available for payment source

## 2020-02-10 DIAGNOSIS — R42 Dizziness and giddiness: Secondary | ICD-10-CM

## 2020-02-10 NOTE — Telephone Encounter (Signed)
Patient is calling and requesting a refill for hydrocodone- acetaminophen [facility] [place], please advise. CB is [phone].

## 2020-02-10 NOTE — Telephone Encounter (Signed)
Patient calling for refill on pending medication last OV 11/16/19. Please advise.

## 2020-02-11 MED ORDER — HYDROCODONE-ACETAMINOPHEN 5-325 MG PO TABS: ORAL_TABLET | ORAL | 0 refills | Status: DC

## 2020-02-14 ENCOUNTER — Encounter: Payer: Self-pay | Admitting: Family Medicine

## 2020-02-14 ENCOUNTER — Ambulatory Visit: Payer: No Typology Code available for payment source | Admitting: Family Medicine

## 2020-02-14 ENCOUNTER — Other Ambulatory Visit: Payer: Self-pay

## 2020-02-14 VITALS — BP 122/74 | HR 81 | Temp 97.9°F | Ht 68.0 in | Wt 208.8 lb

## 2020-02-14 DIAGNOSIS — Z720 Tobacco use: Secondary | ICD-10-CM | POA: Insufficient documentation

## 2020-02-14 DIAGNOSIS — K760 Fatty (change of) liver, not elsewhere classified: Secondary | ICD-10-CM

## 2020-02-14 DIAGNOSIS — N1831 Chronic kidney disease, stage 3a: Secondary | ICD-10-CM

## 2020-02-14 DIAGNOSIS — E785 Hyperlipidemia, unspecified: Secondary | ICD-10-CM | POA: Insufficient documentation

## 2020-02-14 DIAGNOSIS — Z789 Other specified health status: Secondary | ICD-10-CM

## 2020-02-14 DIAGNOSIS — Z7289 Other problems related to lifestyle: Secondary | ICD-10-CM | POA: Diagnosis not present

## 2020-02-14 DIAGNOSIS — E1121 Type 2 diabetes mellitus with diabetic nephropathy: Secondary | ICD-10-CM

## 2020-02-14 DIAGNOSIS — E7800 Pure hypercholesterolemia, unspecified: Secondary | ICD-10-CM

## 2020-02-14 DIAGNOSIS — E1122 Type 2 diabetes mellitus with diabetic chronic kidney disease: Secondary | ICD-10-CM

## 2020-02-14 DIAGNOSIS — F1090 Alcohol use, unspecified, uncomplicated: Secondary | ICD-10-CM

## 2020-02-14 DIAGNOSIS — E1169 Type 2 diabetes mellitus with other specified complication: Secondary | ICD-10-CM | POA: Insufficient documentation

## 2020-02-14 NOTE — Progress Notes (Signed)
Established Patient Office Visit  Subjective:  Patient ID: Gavin Hopkins, male    DOB: [birth year]  Age: 64 y.o. MRN: [id number]  CC:  Chief Complaint  Patient presents with  . Follow-up    3 month follow, pt not sure if he should stop taking Asprin or not.     HPI Gavin Hopkins presents for follow-up of chronic lower back pain, alcohol use, tobacco abuse, recommendation on aspirin therapy.  Cardiologist recommended that he speak with me about whether or not to continue the aspirin.  Patient has no known history of vascular disease.  He does smoke and has diabetes with stage 3 chronic kidney disease.  He has not had anything to drink over the last few months.  He has been taking a 325 mg aspirin daily.  Continues to work part-time at [employer]  Hypertension is managed by cardiology with HCTZ, metoprolol, valsartan and verapamil.  Past Medical History:  Diagnosis Date  . Arthritis    oa  . Diabetes mellitus without complication ([place])    type 2  . Displaced fracture of lateral malleolus of right fibula, initial encounter for closed fracture 30 yrs ago   and tibula, full cast applied  . Hypertension   . Sleep apnea    could not tolerate cpap    Past Surgical History:  Procedure Laterality Date  . FRACTURE SURGERY    . LEG SURGERY Right    over 30 yrs ago  . NO PAST SURGERIES    . TOTAL HIP ARTHROPLASTY Right 06/07/2019   Procedure: TOTAL HIP ARTHROPLASTY ANTERIOR APPROACH;  Surgeon: Gaynelle Arabian, MD;  Location: [facility];  Service: Orthopedics;  Laterality: Right;  172min    Family History  Problem Relation Age of Onset  . Hypertension Mother   . Hyperlipidemia Mother   . Diabetes Mother   . Heart attack Mother        stent  . Stroke Mother   . Heart attack Brother   . Diabetes Brother     Social History   Socioeconomic History  . Marital status: Divorced    Spouse name: Not on file  . Number of children: 1  . Years of education: Not on file  . Highest  education level: Not on file  Occupational History  . Not on file  Tobacco Use  . Smoking status: Current Every Day Smoker    Packs/day: 0.75    Years: 15.00    Pack years: 11.25    Types: Cigarettes  . Smokeless tobacco: Never Used  Vaping Use  . Vaping Use: Never used  Substance and Sexual Activity  . Alcohol use: Not Currently    Comment: Quit in 1/21.   . Drug use: Never  . Sexual activity: Not on file  Other Topics Concern  . Not on file  Social History Narrative  . Not on file   Social Determinants of Health   Financial Resource Strain:   . Difficulty of Paying Living Expenses:   Food Insecurity:   . Worried About Charity fundraiser in the Last Year:   . Arboriculturist in the Last Year:   Transportation Needs:   . Film/video editor (Medical):   Marland Kitchen Lack of Transportation (Non-Medical):   Physical Activity:   . Days of Exercise per Week:   . Minutes of Exercise per Session:   Stress:   . Feeling of Stress :   Social Connections:   . Frequency of Communication with  Friends and Family:   . Frequency of Social Gatherings with Friends and Family:   . Attends Religious Services:   . Active Member of Clubs or Organizations:   . Attends Archivist Meetings:   Marland Kitchen Marital Status:   Intimate Partner Violence:   . Fear of Current or Ex-Partner:   . Emotionally Abused:   Marland Kitchen Physically Abused:   . Sexually Abused:     Outpatient Medications Prior to Visit  Medication Sig Dispense Refill  . Aromatic Inhalants (VICKS BABYRUB) OINT Apply 1 application topically at bedtime.     . gabapentin (NEURONTIN) 300 MG capsule One tab PO qHS for a week, then BID for a week, then TID. May double weekly to a max of 3,600mg /day 180 capsule 3  . hydrochlorothiazide (HYDRODIURIL) 12.5 MG tablet Take 1 tablet (12.5 mg total) by mouth daily. 90 tablet 1  . HYDROcodone-acetaminophen (NORCO) 5-325 MG tablet May take one daily prior to work 90 tablet 0  . lovastatin (MEVACOR) 40  MG tablet Take 1 tablet (40 mg total) by mouth at bedtime. 30 tablet 3  . metFORMIN (GLUCOPHAGE) 500 MG tablet Take 2 tablets (1,000 mg total) by mouth daily in the afternoon. 180 tablet 0  . metoprolol succinate (TOPROL-XL) 100 MG 24 hr tablet Take 1 tablet (100 mg total) by mouth daily. 90 tablet 1  . Multiple Vitamin (MULTIVITAMIN WITH MINERALS) TABS tablet Take 1 tablet by mouth daily. Centrum Silver for Men 50+    . valsartan (DIOVAN) 320 MG tablet Take 1 tablet (320 mg total) by mouth daily. 90 tablet 0  . verapamil (CALAN-SR) 240 MG CR tablet Take 1 tablet (240 mg total) by mouth at bedtime. 90 tablet 1  . aspirin 325 MG tablet Take 325 mg by mouth daily.     No facility-administered medications prior to visit.    No Known Allergies  ROS Review of Systems  Constitutional: Negative.   HENT: Negative.   Eyes: Negative for photophobia and visual disturbance.  Respiratory: Negative for chest tightness and shortness of breath.   Cardiovascular: Negative for chest pain and leg swelling.  Gastrointestinal: Negative.   Endocrine: Negative for polyphagia and polyuria.  Genitourinary: Negative.   Musculoskeletal: Positive for arthralgias and back pain.  Allergic/Immunologic: Negative for immunocompromised state.  Neurological: Negative for speech difficulty and headaches.  Hematological: Does not bruise/bleed easily.  Psychiatric/Behavioral: Negative.       Objective:    Physical Exam Vitals and nursing note reviewed.  Constitutional:      General: He is not in acute distress.    Appearance: Normal appearance. He is normal weight. He is not ill-appearing, toxic-appearing or diaphoretic.  HENT:     Head: Normocephalic and atraumatic.     Right Ear: External ear normal.     Left Ear: External ear normal.  Eyes:     General: No scleral icterus.       Right eye: No discharge.        Left eye: No discharge.     Extraocular Movements: Extraocular movements intact.      Conjunctiva/sclera: Conjunctivae normal.     Pupils: Pupils are equal, round, and reactive to light.  Cardiovascular:     Rate and Rhythm: Normal rate and regular rhythm.     Pulses:          Dorsalis pedis pulses are 2+ on the right side and 2+ on the left side.       Posterior tibial pulses are  1+ on the right side and 1+ on the left side.  Pulmonary:     Effort: Pulmonary effort is normal.     Breath sounds: Decreased air movement present.  Musculoskeletal:     Cervical back: No rigidity or tenderness.  Lymphadenopathy:     Cervical: No cervical adenopathy.  Neurological:     Mental Status: He is alert.    Diabetic Foot Exam - Simple   Simple Foot Form Visual Inspection No deformities, no ulcerations, no other skin breakdown bilaterally: Yes Sensation Testing Intact to touch and monofilament testing bilaterally: Yes Pulse Check Posterior Tibialis and Dorsalis pulse intact bilaterally: Yes Comments     BP 122/74   Pulse 81   Temp 97.9 F (36.6 C) (Tympanic)   Ht 5\' 8"  (1.727 m)   Wt 208 lb 12.8 oz (94.7 kg)   SpO2 94%   BMI 31.75 kg/m  Wt Readings from Last 3 Encounters:  02/14/20 208 lb 12.8 oz (94.7 kg)  01/27/20 207 lb (93.9 kg)  11/16/19 211 lb (95.7 kg)     Health Maintenance Due  Topic Date Due  . Hepatitis C Screening  Never done  . COVID-19 Vaccine (1) Never done  . TETANUS/TDAP  Never done  . COLONOSCOPY  Never done  . HEMOGLOBIN A1C  11/30/2019    There are no preventive care reminders to display for this patient.  No results found for: TSH Lab Results  Component Value Date   WBC 17.0 (H) 06/08/2019   HGB 13.1 06/08/2019   HCT 39.7 06/08/2019   MCV 98.5 06/08/2019   PLT 230 06/08/2019   Lab Results  Component Value Date   NA 139 08/02/2019   K 4.4 08/02/2019   CO2 27 08/02/2019   GLUCOSE 100 (H) 08/02/2019   BUN 16 08/02/2019   CREATININE 1.19 08/02/2019   BILITOT 0.3 09/02/2019   ALKPHOS 70 09/02/2019   AST 20 09/02/2019   ALT  22 09/02/2019   PROT 7.0 09/02/2019   ALBUMIN 4.3 09/02/2019   CALCIUM 9.9 08/02/2019   ANIONGAP 12 06/08/2019   GFR 61.18 08/02/2019   No results found for: CHOL No results found for: HDL No results found for: LDLCALC No results found for: TRIG No results found for: CHOLHDL Lab Results  Component Value Date   HGBA1C 6.3 (H) 06/02/2019      Assessment & Plan:   Problem List Items Addressed This Visit      Digestive   Hepatic steatosis     Endocrine   Type 2 diabetes mellitus with stage 3a chronic kidney disease, without long-term current use of insulin ([place])     Other   Alcohol use - Primary   Tobacco abuse   Elevated cholesterol      No orders of the defined types were placed in this encounter.   Follow-up: Return in about 2 months (around 04/16/2020), or continue low dose 81mg  asa with diabetes history. Continue lovastatin for now. Please stop smoking!, for Return for physical. .  Again stressed the importance of abstinence from alcohol and smoking cessation.  Given information on aspirin and steps to quit smoking.  Discontinue high-dose daily aspirin in favor of 81 mg dose.  Libby Maw, MD

## 2020-02-14 NOTE — Patient Instructions (Signed)
Coping with Quitting Smoking  Quitting smoking is a physical and mental challenge. You will face cravings, withdrawal symptoms, and temptation. Before quitting, work with your health care provider to make a plan that can help you cope. Preparation can help you quit and keep you from giving in. How can I cope with cravings? Cravings usually last for 5-10 minutes. If you get through it, the craving will pass. Consider taking the following actions to help you cope with cravings:  Keep your mouth busy: ? Chew sugar-free gum. ? Suck on hard candies or a straw. ? Brush your teeth.  Keep your hands and body busy: ? Immediately change to a different activity when you feel a craving. ? Squeeze or play with a ball. ? Do an activity or a hobby, like making bead jewelry, practicing needlepoint, or working with wood. ? Mix up your normal routine. ? Take a short exercise break. Go for a quick walk or run up and down stairs. ? Spend time in public places where smoking is not allowed.  Focus on doing something kind or helpful for someone else.  Call a friend or family member to talk during a craving.  Join a support group.  Call a quit line, such as 1-800-QUIT-NOW.  Talk with your health care provider about medicines that might help you cope with cravings and make quitting easier for you. How can I deal with withdrawal symptoms? Your body may experience negative effects as it tries to get used to not having nicotine in the system. These effects are called withdrawal symptoms. They may include:  Feeling hungrier than normal.  Trouble concentrating.  Irritability.  Trouble sleeping.  Feeling depressed.  Restlessness and agitation.  Craving a cigarette. To manage withdrawal symptoms:  Avoid places, people, and activities that trigger your cravings.  Remember why you want to quit.  Get plenty of sleep.  Avoid coffee and other caffeinated drinks. These may worsen some of your  symptoms. How can I handle social situations? Social situations can be difficult when you are quitting smoking, especially in the first few weeks. To manage this, you can:  Avoid parties, bars, and other social situations where people might be smoking.  Avoid alcohol.  Leave right away if you have the urge to smoke.  Explain to your family and friends that you are quitting smoking. Ask for understanding and support.  Plan activities with friends or family where smoking is not an option. What are some ways I can cope with stress? Wanting to smoke may cause stress, and stress can make you want to smoke. Find ways to manage your stress. Relaxation techniques can help. For example:  Breathe slowly and deeply, in through your nose and out through your mouth.  Listen to soothing, relaxing music.  Talk with a family member or friend about your stress.  Light a candle.  Soak in a bath or take a shower.  Think about a peaceful place. What are some ways I can prevent weight gain? Be aware that many people gain weight after they quit smoking. However, not everyone does. To keep from gaining weight, have a plan in place before you quit and stick to the plan after you quit. Your plan should include:  Having healthy snacks. When you have a craving, it may help to: ? Eat plain popcorn, crunchy carrots, celery, or other cut vegetables. ? Chew sugar-free gum.  Changing how you eat: ? Eat small portion sizes at meals. ? Eat 4-6 small meals   throughout the day instead of 1-2 large meals a day. ? Be mindful when you eat. Do not watch television or do other things that might distract you as you eat.  Exercising regularly: ? Make time to exercise each day. If you do not have time for a long workout, do short bouts of exercise for 5-10 minutes several times a day. ? Do some form of strengthening exercise, like weight lifting, and some form of aerobic exercise, like running or swimming.  Drinking  plenty of water or other low-calorie or no-calorie drinks. Drink 6-8 glasses of water daily, or as much as instructed by your health care provider. Summary  Quitting smoking is a physical and mental challenge. You will face cravings, withdrawal symptoms, and temptation to smoke again. Preparation can help you as you go through these challenges.  You can cope with cravings by keeping your mouth busy (such as by chewing gum), keeping your body and hands busy, and making calls to family, friends, or a helpline for people who want to quit smoking.  You can cope with withdrawal symptoms by avoiding places where people smoke, avoiding drinks with caffeine, and getting plenty of rest.  Ask your health care provider about the different ways to prevent weight gain, avoid stress, and handle social situations. This information is not intended to replace advice given to you by your health care provider. Make sure you discuss any questions you have with your health care provider. Document Revised: 06/27/2017 Document Reviewed: 07/12/2016 Elsevier Patient Education  [place].  Aspirin and Your Heart  Aspirin is a medicine that prevents the cells in the blood that are used for clotting, called platelets, from sticking together. Aspirin can be used to help reduce the risk of blood clots, heart attacks, and other heart-related problems. Can I take aspirin? Your health care provider will help you determine whether it is safe and beneficial for you to take aspirin daily. Taking aspirin daily may be helpful if you:  Have had a heart attack or chest pain.  Are at risk for a heart attack.  Have undergone open-heart surgery, such as coronary artery bypass surgery (CABG).  Have had coronary angioplasty or a stent.  Have had certain types of stroke or transient ischemic attack (TIA).  Have peripheral artery disease (PAD).  Have chronic heart rhythm problems such as atrial fibrillation and cannot  take an anticoagulant.  Have valve disease or have had surgery on a valve. What are the risks? Daily use of aspirin can cause side effects. Some of these include:  Bleeding. Bleeding problems can be minor or serious. An example of a minor problem is a cut that does not stop bleeding. An example of a more serious problem is stomach bleeding or, rarely, bleeding into the brain. Your risk of bleeding is increased if you are also taking non-steroidal anti-inflammatory drugs (NSAIDs).  Increased bruising.  Upset stomach.  An allergic reaction. People who have nasal polyps have an increased risk of developing an aspirin allergy. General guidelines  Take aspirin only as told by your health care provider. Make sure that you understand how much you should take and what form you should take. The two forms of aspirin are: ? Non-enteric-coated.This type of aspirin does not have a coating and is absorbed quickly. This type of aspirin also comes in a chewable form. ? Enteric-coated. This type of aspirin has a coating that releases the medicine very slowly. Enteric-coated aspirin might cause less stomach upset than non-enteric-coated  aspirin. This type of aspirin should not be chewed or crushed.  Limit alcohol intake to no more than 1 drink a day for nonpregnant women and 2 drinks a day for men. Drinking alcohol increases your risk of bleeding. One drink equals 12 oz of beer, 5 oz of wine, or 1 oz of hard liquor. Contact a health care provider if you:  Have unusual bleeding or bruising.  Have stomach pain or nausea.  Have ringing in your ears.  Have an allergic reaction that causes: ? Hives. ? Itchy skin. ? Swelling of the lips, tongue, or face. Get help right away if you:  Notice that your bowel movements are bloody, dark red, or black in color.  Vomit or cough up blood.  Have blood in your urine.  Cough, have noisy breathing (wheeze), or feel short of breath.  Have chest pain,  especially if the pain spreads to the arms, back, neck, or jaw.  Have a severe headache, or a headache with confusion, or dizziness. These symptoms may represent a serious problem that is an emergency. Do not wait to see if the symptoms will go away. Get medical help right away. Call your local emergency services (911 in the [place] Do not drive yourself to the hospital. Summary  Aspirin can be used to help reduce the risk of blood clots, heart attacks, and other heart-related problems.  Daily use of aspirin can increase your risk of side effects. Your health care provider will help you determine whether it is safe and beneficial for you to take aspirin daily.  Take aspirin only as told by your health care provider. Make sure that you understand how much you can take and what form you can take. This information is not intended to replace advice given to you by your health care provider. Make sure you discuss any questions you have with your health care provider. Document Revised: 05/15/2017 Document Reviewed: 05/15/2017 Elsevier Patient Education  2020 [employer].

## 2020-02-16 ENCOUNTER — Other Ambulatory Visit: Payer: Self-pay | Admitting: Family Medicine

## 2020-02-17 ENCOUNTER — Telehealth: Payer: Self-pay

## 2020-02-17 NOTE — Telephone Encounter (Signed)
-----   Message from [place], [place] sent at 02/17/2020  1:29 PM EDT ----- No hemodynamically significant stenosis noted per report.We will discuss at the next office visit.

## 2020-02-17 NOTE — Telephone Encounter (Signed)
Called pt to inform him about his [place]. Pt understood

## 2020-02-18 ENCOUNTER — Other Ambulatory Visit: Payer: Self-pay | Admitting: Family Medicine

## 2020-02-24 ENCOUNTER — Other Ambulatory Visit: Payer: Self-pay | Admitting: Family Medicine

## 2020-02-28 ENCOUNTER — Telehealth: Payer: Self-pay | Admitting: Family Medicine

## 2020-02-28 DIAGNOSIS — G8929 Other chronic pain: Secondary | ICD-10-CM

## 2020-02-28 DIAGNOSIS — G5693 Unspecified mononeuropathy of bilateral upper limbs: Secondary | ICD-10-CM

## 2020-02-28 MED ORDER — GABAPENTIN 300 MG PO CAPS: ORAL_CAPSULE | ORAL | 1 refills | Status: DC

## 2020-02-28 NOTE — Telephone Encounter (Signed)
Sent to pharmacy 

## 2020-02-28 NOTE — Telephone Encounter (Signed)
Spoke with patient who states that he is currently taking Gabapentin 300mg  1 cap three times a day with a 90 day supply.

## 2020-02-28 NOTE — Telephone Encounter (Signed)
Pt calling and said that He is trying to get gabapentin (NEURONTIN) 300 MG capsule filled but it is out of refills and said that Dr Georgina Snell Evan's office wont refill it because he isnt seeing them anymore, He wants to know if Dr Ethelene Hal will fill it, please advise. Pt goes on vacation this Friday and wanted to know if he can get it filled before then.

## 2020-02-28 NOTE — Telephone Encounter (Signed)
What dose is he currently taking?

## 2020-02-28 NOTE — Telephone Encounter (Signed)
Dr. Kremer please advise message below.  

## 2020-02-29 NOTE — Telephone Encounter (Signed)
Patient came in for OV

## 2020-03-13 IMAGING — RF DG HIP (WITH PELVIS) OPERATIVE*L*
1 series · 4 of 4 positions shown · non-contrast
Comparison: None.

CLINICAL DATA: 63-year-old male undergoing right hip arthroplasty.

EXAM:
OPERATIVE RIGHT HIP (WITH PELVIS IF PERFORMED) 4 VIEWS
TECHNIQUE: Fluoroscopic spot image(s) were submitted for interpretation
post-operatively.

[Series 1: unknown protocol · 0.20mm/px · 4 of 4 slices shown]
[im 1/4]
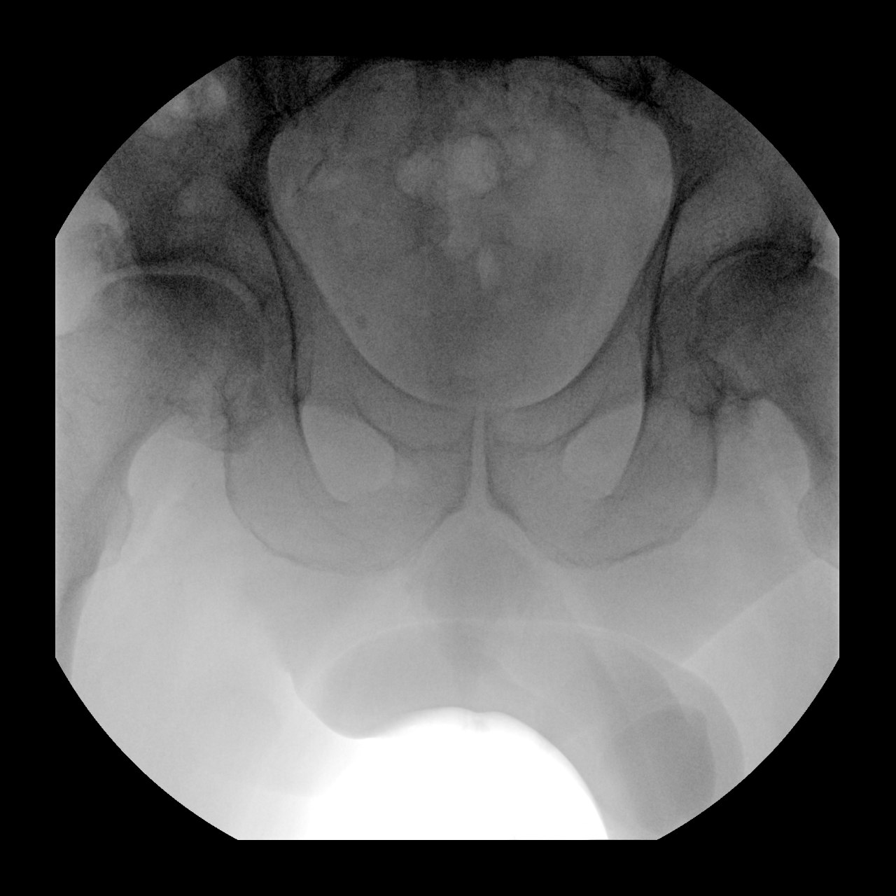
[im 2/4]
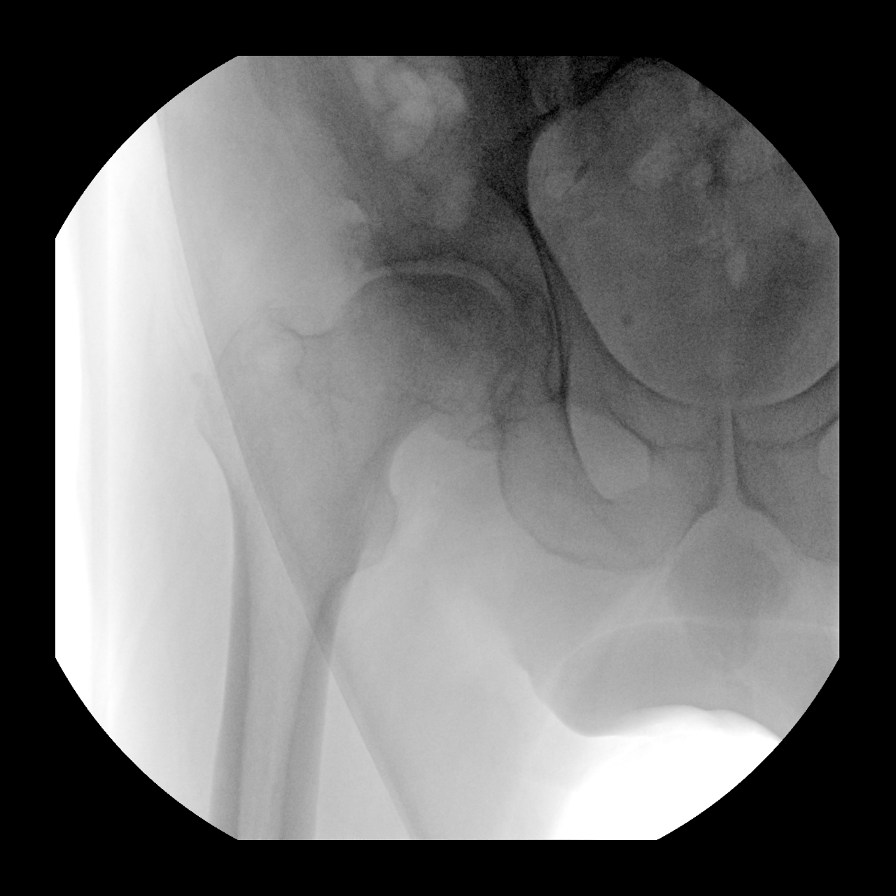
[im 3/4]
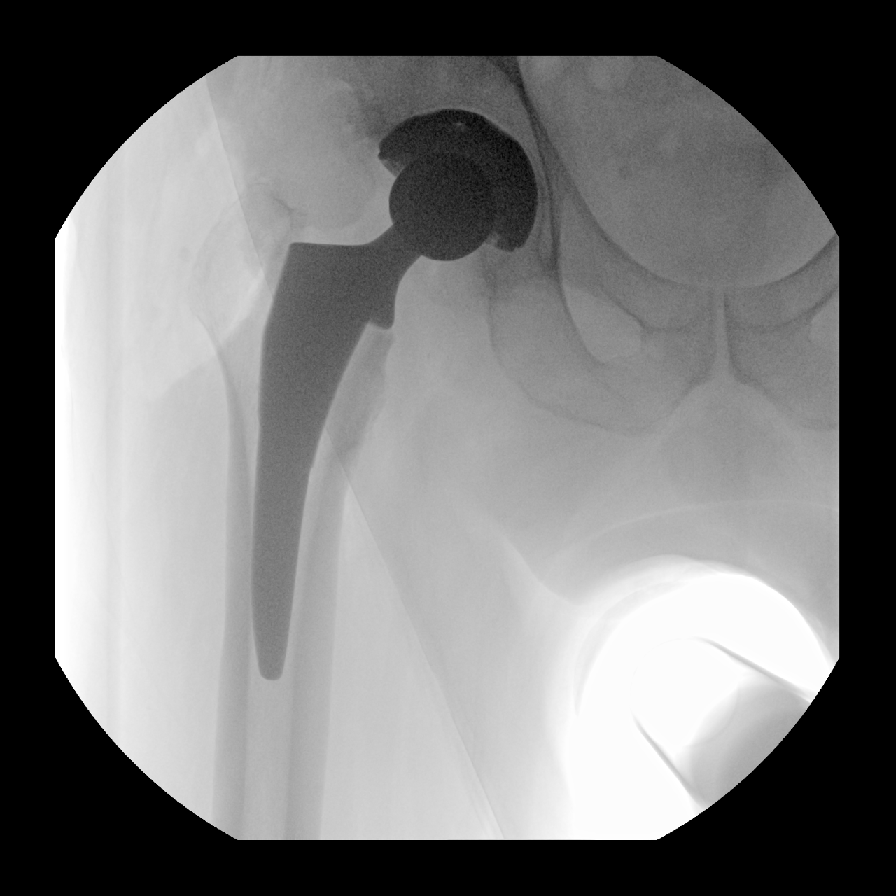
[im 4/4]
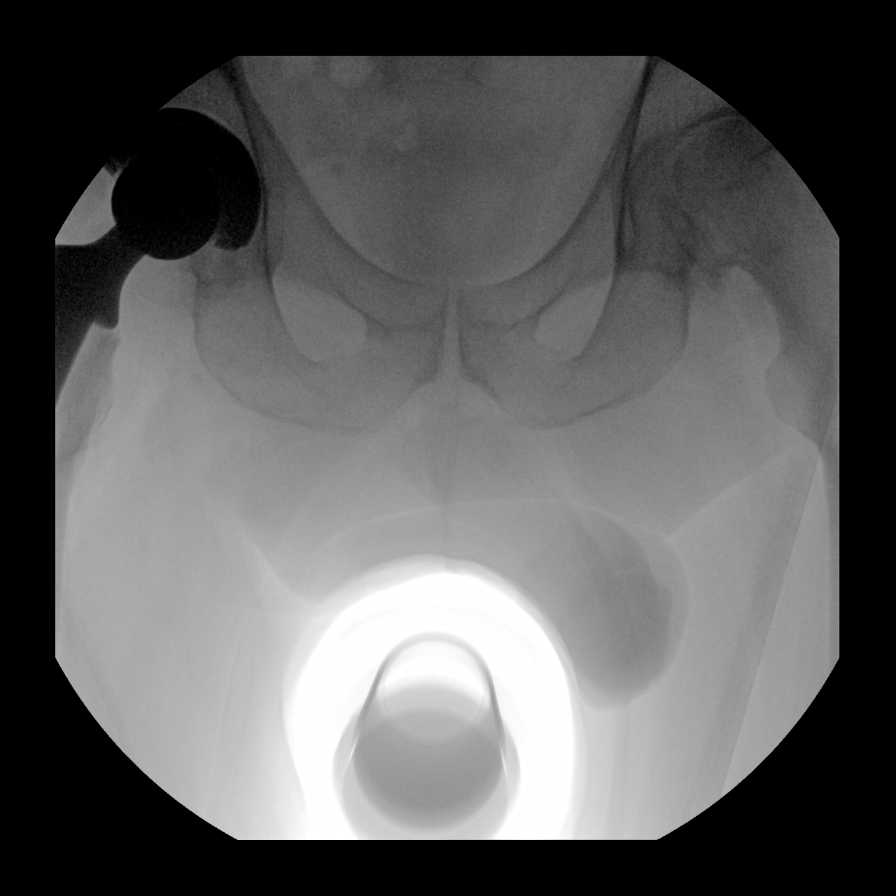

[4 of 4 positions shown; findings below may reference images not displayed]

FINDINGS: 4 intraoperative fluoroscopic spot views of the lower pelvis and
right hip. Bipolar hip arthroplasty is depicted on the right side
over the course of these images. Hardware appears intact, with
normal AP alignment.

FLUOROSCOPY TIME:  0 minutes 10 seconds
IMPRESSION: Right hip arthroplasty with no adverse features.

## 2020-03-25 ENCOUNTER — Other Ambulatory Visit: Payer: Self-pay | Admitting: Family Medicine

## 2020-04-18 ENCOUNTER — Other Ambulatory Visit: Payer: Self-pay

## 2020-04-19 ENCOUNTER — Ambulatory Visit (INDEPENDENT_AMBULATORY_CARE_PROVIDER_SITE_OTHER): Payer: No Typology Code available for payment source | Admitting: Family Medicine

## 2020-04-19 ENCOUNTER — Encounter: Payer: Self-pay | Admitting: Family Medicine

## 2020-04-19 VITALS — BP 118/78 | HR 70 | Temp 97.7°F | Ht 68.0 in | Wt 210.4 lb

## 2020-04-19 DIAGNOSIS — R7989 Other specified abnormal findings of blood chemistry: Secondary | ICD-10-CM | POA: Diagnosis not present

## 2020-04-19 DIAGNOSIS — Z7289 Other problems related to lifestyle: Secondary | ICD-10-CM

## 2020-04-19 DIAGNOSIS — E1122 Type 2 diabetes mellitus with diabetic chronic kidney disease: Secondary | ICD-10-CM

## 2020-04-19 DIAGNOSIS — Z0000 Encounter for general adult medical examination without abnormal findings: Secondary | ICD-10-CM

## 2020-04-19 DIAGNOSIS — N1831 Chronic kidney disease, stage 3a: Secondary | ICD-10-CM

## 2020-04-19 DIAGNOSIS — I10 Essential (primary) hypertension: Secondary | ICD-10-CM | POA: Diagnosis not present

## 2020-04-19 DIAGNOSIS — E1121 Type 2 diabetes mellitus with diabetic nephropathy: Secondary | ICD-10-CM

## 2020-04-19 DIAGNOSIS — E7800 Pure hypercholesterolemia, unspecified: Secondary | ICD-10-CM

## 2020-04-19 DIAGNOSIS — F1090 Alcohol use, unspecified, uncomplicated: Secondary | ICD-10-CM

## 2020-04-19 DIAGNOSIS — Z789 Other specified health status: Secondary | ICD-10-CM

## 2020-04-19 DIAGNOSIS — K760 Fatty (change of) liver, not elsewhere classified: Secondary | ICD-10-CM

## 2020-04-19 DIAGNOSIS — Z720 Tobacco use: Secondary | ICD-10-CM

## 2020-04-19 LAB — LIPID PANEL
Cholesterol: 137 mg/dL (ref 0–200)
HDL: 37.5 mg/dL — ABNORMAL LOW (ref >39.00)
LDL Cholesterol: 67 mg/dL (ref 0–99)
NonHDL: 99.35
Total CHOL/HDL Ratio: 4
Triglycerides: 163 mg/dL — ABNORMAL HIGH (ref 0.0–149.0)
VLDL: 32.6 mg/dL (ref 0.0–40.0)

## 2020-04-19 LAB — URINALYSIS, ROUTINE W REFLEX MICROSCOPIC
Bilirubin Urine: NEGATIVE
Hgb urine dipstick: NEGATIVE
Ketones, ur: NEGATIVE
Leukocytes,Ua: NEGATIVE
Nitrite: NEGATIVE
RBC / HPF: NONE SEEN (ref 0–?)
Specific Gravity, Urine: 1.01 (ref 1.000–1.030)
Total Protein, Urine: NEGATIVE
Urine Glucose: NEGATIVE
Urobilinogen, UA: 0.2 (ref 0.0–1.0)
pH: 6 (ref 5.0–8.0)

## 2020-04-19 LAB — HEPATIC FUNCTION PANEL
ALT: 23 U/L (ref 0–53)
AST: 22 U/L (ref 0–37)
Albumin: 4.4 g/dL (ref 3.5–5.2)
Alkaline Phosphatase: 75 U/L (ref 39–117)
Bilirubin, Direct: 0.1 mg/dL (ref 0.0–0.3)
Total Bilirubin: 0.4 mg/dL (ref 0.2–1.2)
Total Protein: 7.3 g/dL (ref 6.0–8.3)

## 2020-04-19 LAB — BASIC METABOLIC PANEL
BUN: 21 mg/dL (ref 6–23)
CO2: 27 mEq/L (ref 19–32)
Calcium: 9.8 mg/dL (ref 8.4–10.5)
Chloride: 101 mEq/L (ref 96–112)
Creatinine, Ser: 1.35 mg/dL (ref 0.40–1.50)
GFR: 52.78 mL/min — ABNORMAL LOW (ref >60.00)
Glucose, Bld: 99 mg/dL (ref 70–99)
Potassium: 4.6 mEq/L (ref 3.5–5.1)
Sodium: 136 mEq/L (ref 135–145)

## 2020-04-19 LAB — MICROALBUMIN / CREATININE URINE RATIO
Creatinine,U: 71.6 mg/dL
Microalb Creat Ratio: 3.8 mg/g (ref 0.0–30.0)
Microalb, Ur: 2.7 mg/dL — ABNORMAL HIGH (ref 0.0–1.9)

## 2020-04-19 LAB — GAMMA GT: GGT: 45 U/L (ref 7–51)

## 2020-04-19 LAB — CBC
HCT: 49.4 % (ref 39.0–52.0)
Hemoglobin: 16.6 g/dL (ref 13.0–17.0)
MCHC: 33.6 g/dL (ref 30.0–36.0)
MCV: 96.7 fl (ref 78.0–100.0)
Platelets: 282 10*3/uL (ref 150.0–400.0)
RBC: 5.1 Mil/uL (ref 4.22–5.81)
RDW: 13.9 % (ref 11.5–15.5)
WBC: 10.5 10*3/uL (ref 4.0–10.5)

## 2020-04-19 LAB — PSA: PSA: 0.5 ng/mL (ref 0.10–4.00)

## 2020-04-19 LAB — HEMOGLOBIN A1C: Hgb A1c MFr Bld: 6.4 % (ref 4.6–6.5)

## 2020-04-19 NOTE — Patient Instructions (Signed)
Health Maintenance After Age 66 After age 39, you are at a higher risk for certain long-term diseases and infections as well as injuries from falls. Falls are a major cause of broken bones and head injuries in people who are older than age 66. Getting regular preventive care can help to keep you healthy and well. Preventive care includes getting regular testing and making lifestyle changes as recommended by your health care provider. Talk with your health care provider about:  Which screenings and tests you should have. A screening is a test that checks for a disease when you have no symptoms.  A diet and exercise plan that is right for you. What should I know about screenings and tests to prevent falls? Screening and testing are the best ways to find a health problem early. Early diagnosis and treatment give you the best chance of managing medical conditions that are common after age 8. Certain conditions and lifestyle choices may make you more likely to have a fall. Your health care provider may recommend:  Regular vision checks. Poor vision and conditions such as cataracts can make you more likely to have a fall. If you wear glasses, make sure to get your prescription updated if your vision changes.  Medicine review. Work with your health care provider to regularly review all of the medicines you are taking, including over-the-counter medicines. Ask your health care provider about any side effects that may make you more likely to have a fall. Tell your health care provider if any medicines that you take make you feel dizzy or sleepy.  Osteoporosis screening. Osteoporosis is a condition that causes the bones to get weaker. This can make the bones weak and cause them to break more easily.  Blood pressure screening. Blood pressure changes and medicines to control blood pressure can make you feel dizzy.  Strength and balance checks. Your health care provider may recommend certain tests to check your  strength and balance while standing, walking, or changing positions.  Foot health exam. Foot pain and numbness, as well as not wearing proper footwear, can make you more likely to have a fall.  Depression screening. You may be more likely to have a fall if you have a fear of falling, feel emotionally low, or feel unable to do activities that you used to do.  Alcohol use screening. Using too much alcohol can affect your balance and may make you more likely to have a fall. What actions can I take to lower my risk of falls? General instructions  Talk with your health care provider about your risks for falling. Tell your health care provider if: ? You fall. Be sure to tell your health care provider about all falls, even ones that seem minor. ? You feel dizzy, sleepy, or off-balance.  Take over-the-counter and prescription medicines only as told by your health care provider. These include any supplements.  Eat a healthy diet and maintain a healthy weight. A healthy diet includes low-fat dairy products, low-fat (lean) meats, and fiber from whole grains, beans, and lots of fruits and vegetables. Home safety  Remove any tripping hazards, such as rugs, cords, and clutter.  Install safety equipment such as grab bars in bathrooms and safety rails on stairs.  Keep rooms and walkways well-lit. Activity   Follow a regular exercise program to stay fit. This will help you maintain your balance. Ask your health care provider what types of exercise are appropriate for you.  If you need a cane or  walker, use it as recommended by your health care provider.  Wear supportive shoes that have nonskid soles. Lifestyle  Do not drink alcohol if your health care provider tells you not to drink.  If you drink alcohol, limit how much you have: ? 0-1 drink a day for women. ? 0-2 drinks a day for men.  Be aware of how much alcohol is in your drink. In the [place] one drink equals one typical bottle of beer (12  oz), one-half glass of wine (5 oz), or one shot of hard liquor (1 oz).  Do not use any products that contain nicotine or tobacco, such as cigarettes and e-cigarettes. If you need help quitting, ask your health care provider. Summary  Having a healthy lifestyle and getting preventive care can help to protect your health and wellness after age 28.  Screening and testing are the best way to find a health problem early and help you avoid having a fall. Early diagnosis and treatment give you the best chance for managing medical conditions that are more common for people who are older than age 45.  Falls are a major cause of broken bones and head injuries in people who are older than age 81. Take precautions to prevent a fall at home.  Work with your health care provider to learn what changes you can make to improve your health and wellness and to prevent falls. This information is not intended to replace advice given to you by your health care provider. Make sure you discuss any questions you have with your health care provider. Document Revised: 11/05/2018 Document Reviewed: 05/28/2017 Elsevier Patient Education  2020 [employer].  Steps to Quit Smoking Smoking tobacco is the leading cause of preventable death. It can affect almost every organ in the body. Smoking puts you and those around you at risk for developing many serious chronic diseases. Quitting smoking can be difficult, but it is one of the best things that you can do for your health. It is never too late to quit. How do I get ready to quit? When you decide to quit smoking, create a plan to help you succeed. Before you quit:  Pick a date to quit. Set a date within the next 2 weeks to give you time to prepare.  Write down the reasons why you are quitting. Keep this list in places where you will see it often.  Tell your family, friends, and co-workers that you are quitting. Support from your loved ones can make quitting  easier.  Talk with your health care provider about your options for quitting smoking.  Find out what treatment options are covered by your health insurance.  Identify people, places, things, and activities that make you want to smoke (triggers). Avoid them. What first steps can I take to quit smoking?  Throw away all cigarettes at home, at work, and in your car.  Throw away smoking accessories, such as Scientist, research (medical).  Clean your car. Make sure to empty the ashtray.  Clean your home, including curtains and carpets. What strategies can I use to quit smoking? Talk with your health care provider about combining strategies, such as taking medicines while you are also receiving in-person counseling. Using these two strategies together makes you more likely to succeed in quitting than if you used either strategy on its own.  If you are pregnant or breastfeeding, talk with your health care provider about finding counseling or other support strategies to quit smoking. Do not take  medicine to help you quit smoking unless your health care provider tells you to do so. To quit smoking: Quit right away  Quit smoking completely, instead of gradually reducing how much you smoke over a period of time. Research shows that stopping smoking right away is more successful than gradually quitting.  Attend in-person counseling to help you build problem-solving skills. You are more likely to succeed in quitting if you attend counseling sessions regularly. Even short sessions of 10 minutes can be effective. Take medicine You may take medicines to help you quit smoking. Some medicines require a prescription and some you can purchase over-the-counter. Medicines may have nicotine in them to replace the nicotine in cigarettes. Medicines may:  Help to stop cravings.  Help to relieve withdrawal symptoms. Your health care provider may recommend:  Nicotine patches, gum, or lozenges.  Nicotine inhalers or  sprays.  Non-nicotine medicine that is taken by mouth. Find resources Find resources and support systems that can help you to quit smoking and remain smoke-free after you quit. These resources are most helpful when you use them often. They include:  Online chats with a Social worker.  Telephone quitlines.  Printed Furniture conservator/restorer.  Support groups or group counseling.  Text messaging programs.  Mobile phone apps or applications. Use apps that can help you stick to your quit plan by providing reminders, tips, and encouragement. There are many free apps for mobile devices as well as websites. Examples include Quit Guide from the [employer] and smokefree.gov What things can I do to make it easier to quit?   Reach out to your family and friends for support and encouragement. Call telephone quitlines (1-800-QUIT-NOW), reach out to support groups, or work with a counselor for support.  Ask people who smoke to avoid smoking around you.  Avoid places that trigger you to smoke, such as bars, parties, or smoke-break areas at work.  Spend time with people who do not smoke.  Lessen the stress in your life. Stress can be a smoking trigger for some people. To lessen stress, try: ? Exercising regularly. ? Doing deep-breathing exercises. ? Doing yoga. ? Meditating. ? Performing a body scan. This involves closing your eyes, scanning your body from head to toe, and noticing which parts of your body are particularly tense. Try to relax the muscles in those areas. How will I feel when I quit smoking? Day 1 to 3 weeks Within the first 24 hours of quitting smoking, you may start to feel withdrawal symptoms. These symptoms are usually most noticeable 2-3 days after quitting, but they usually do not last for more than 2-3 weeks. You may experience these symptoms:  Mood swings.  Restlessness, anxiety, or irritability.  Trouble concentrating.  Dizziness.  Strong cravings for sugary foods and  nicotine.  Mild weight gain.  Constipation.  Nausea.  Coughing or a sore throat.  Changes in how the medicines that you take for unrelated issues work in your body.  Depression.  Trouble sleeping (insomnia). Week 3 and afterward After the first 2-3 weeks of quitting, you may start to notice more positive results, such as:  Improved sense of smell and taste.  Decreased coughing and sore throat.  Slower heart rate.  Lower blood pressure.  Clearer skin.  The ability to breathe more easily.  Fewer sick days. Quitting smoking can be very challenging. Do not get discouraged if you are not successful the first time. Some people need to make many attempts to quit before they achieve long-term success.  Do your best to stick to your quit plan, and talk with your health care provider if you have any questions or concerns. Summary  Smoking tobacco is the leading cause of preventable death. Quitting smoking is one of the best things that you can do for your health.  When you decide to quit smoking, create a plan to help you succeed.  Quit smoking right away, not slowly over a period of time.  When you start quitting, seek help from your health care provider, family, or friends. This information is not intended to replace advice given to you by your health care provider. Make sure you discuss any questions you have with your health care provider. Document Revised: 04/09/2019 Document Reviewed: 10/03/2018 Elsevier Patient Education  [place] 65 Years and Older, Male Preventive care refers to lifestyle choices and visits with your health care provider that can promote health and wellness. This includes:  A yearly physical exam. This is also called an annual well check.  Regular dental and eye exams.  Immunizations.  Screening for certain conditions.  Healthy lifestyle choices, such as diet and exercise. What can I expect for my preventive care  visit? Physical exam Your health care provider will check:  Height and weight. These may be used to calculate body mass index (BMI), which is a measurement that tells if you are at a healthy weight.  Heart rate and blood pressure.  Your skin for abnormal spots. Counseling Your health care provider may ask you questions about:  Alcohol, tobacco, and drug use.  Emotional well-being.  Home and relationship well-being.  Sexual activity.  Eating habits.  History of falls.  Memory and ability to understand (cognition).  Work and work Statistician. What immunizations do I need?  Influenza (flu) vaccine  This is recommended every year. Tetanus, diphtheria, and pertussis (Tdap) vaccine  You may need a Td booster every 10 years. Varicella (chickenpox) vaccine  You may need this vaccine if you have not already been vaccinated. Zoster (shingles) vaccine  You may need this after age 51. Pneumococcal conjugate (PCV13) vaccine  One dose is recommended after age 20. Pneumococcal polysaccharide (PPSV23) vaccine  One dose is recommended after age 80. Measles, mumps, and rubella (MMR) vaccine  You may need at least one dose of MMR if you were born in 1957 or later. You may also need a second dose. Meningococcal conjugate (MenACWY) vaccine  You may need this if you have certain conditions. Hepatitis A vaccine  You may need this if you have certain conditions or if you travel or work in places where you may be exposed to hepatitis A. Hepatitis B vaccine  You may need this if you have certain conditions or if you travel or work in places where you may be exposed to hepatitis B. Haemophilus influenzae type b (Hib) vaccine  You may need this if you have certain conditions. You may receive vaccines as individual doses or as more than one vaccine together in one shot (combination vaccines). Talk with your health care provider about the risks and benefits of combination  vaccines. What tests do I need? Blood tests  Lipid and cholesterol levels. These may be checked every 5 years, or more frequently depending on your overall health.  Hepatitis C test.  Hepatitis B test. Screening  Lung cancer screening. You may have this screening every year starting at age 49 if you have a 30-pack-year history of smoking and currently smoke or have quit within  the past 15 years.  Colorectal cancer screening. All adults should have this screening starting at age 38 and continuing until age 83. Your health care provider may recommend screening at age 25 if you are at increased risk. You will have tests every 1-10 years, depending on your results and the type of screening test.  Prostate cancer screening. Recommendations will vary depending on your family history and other risks.  Diabetes screening. This is done by checking your blood sugar (glucose) after you have not eaten for a while (fasting). You may have this done every 1-3 years.  Abdominal aortic aneurysm (AAA) screening. You may need this if you are a current or former smoker.  Sexually transmitted disease (STD) testing. Follow these instructions at home: Eating and drinking  Eat a diet that includes fresh fruits and vegetables, whole grains, lean protein, and low-fat dairy products. Limit your intake of foods with high amounts of sugar, saturated fats, and salt.  Take vitamin and mineral supplements as recommended by your health care provider.  Do not drink alcohol if your health care provider tells you not to drink.  If you drink alcohol: ? Limit how much you have to 0-2 drinks a day. ? Be aware of how much alcohol is in your drink. In the [place] one drink equals one 12 oz bottle of beer (355 mL), one 5 oz glass of wine (148 mL), or one 1 oz glass of hard liquor (44 mL). Lifestyle  Take daily care of your teeth and gums.  Stay active. Exercise for at least 30 minutes on 5 or more days each week.  Do  not use any products that contain nicotine or tobacco, such as cigarettes, e-cigarettes, and chewing tobacco. If you need help quitting, ask your health care provider.  If you are sexually active, practice safe sex. Use a condom or other form of protection to prevent STIs (sexually transmitted infections).  Talk with your health care provider about taking a low-dose aspirin or statin. What's next?  Visit your health care provider once a year for a well check visit.  Ask your health care provider how often you should have your eyes and teeth checked.  Stay up to date on all vaccines. This information is not intended to replace advice given to you by your health care provider. Make sure you discuss any questions you have with your health care provider. Document Revised: 07/09/2018 Document Reviewed: 07/09/2018 Elsevier Patient Education  2020 [place] with Quitting Smoking  Quitting smoking is a physical and mental challenge. You will face cravings, withdrawal symptoms, and temptation. Before quitting, work with your health care provider to make a plan that can help you cope. Preparation can help you quit and keep you from giving in. How can I cope with cravings? Cravings usually last for 5-10 minutes. If you get through it, the craving will pass. Consider taking the following actions to help you cope with cravings:  Keep your mouth busy: ? Chew sugar-free gum. ? Suck on hard candies or a straw. ? Brush your teeth.  Keep your hands and body busy: ? Immediately change to a different activity when you feel a craving. ? Squeeze or play with a ball. ? Do an activity or a hobby, like making bead jewelry, practicing needlepoint, or working with wood. ? Mix up your normal routine. ? Take a short exercise break. Go for a quick walk or run up and down stairs. ? Spend time in public places  where smoking is not allowed.  Focus on doing something kind or helpful for someone  else.  Call a friend or family member to talk during a craving.  Join a support group.  Call a quit line, such as 1-800-QUIT-NOW.  Talk with your health care provider about medicines that might help you cope with cravings and make quitting easier for you. How can I deal with withdrawal symptoms? Your body may experience negative effects as it tries to get used to not having nicotine in the system. These effects are called withdrawal symptoms. They may include:  Feeling hungrier than normal.  Trouble concentrating.  Irritability.  Trouble sleeping.  Feeling depressed.  Restlessness and agitation.  Craving a cigarette. To manage withdrawal symptoms:  Avoid places, people, and activities that trigger your cravings.  Remember why you want to quit.  Get plenty of sleep.  Avoid coffee and other caffeinated drinks. These may worsen some of your symptoms. How can I handle social situations? Social situations can be difficult when you are quitting smoking, especially in the first few weeks. To manage this, you can:  Avoid parties, bars, and other social situations where people might be smoking.  Avoid alcohol.  Leave right away if you have the urge to smoke.  Explain to your family and friends that you are quitting smoking. Ask for understanding and support.  Plan activities with friends or family where smoking is not an option. What are some ways I can cope with stress? Wanting to smoke may cause stress, and stress can make you want to smoke. Find ways to manage your stress. Relaxation techniques can help. For example:  Breathe slowly and deeply, in through your nose and out through your mouth.  Listen to soothing, relaxing music.  Talk with a family member or friend about your stress.  Light a candle.  Soak in a bath or take a shower.  Think about a peaceful place. What are some ways I can prevent weight gain? Be aware that many people gain weight after they quit  smoking. However, not everyone does. To keep from gaining weight, have a plan in place before you quit and stick to the plan after you quit. Your plan should include:  Having healthy snacks. When you have a craving, it may help to: ? Eat plain popcorn, crunchy carrots, celery, or other cut vegetables. ? Chew sugar-free gum.  Changing how you eat: ? Eat small portion sizes at meals. ? Eat 4-6 small meals throughout the day instead of 1-2 large meals a day. ? Be mindful when you eat. Do not watch television or do other things that might distract you as you eat.  Exercising regularly: ? Make time to exercise each day. If you do not have time for a long workout, do short bouts of exercise for 5-10 minutes several times a day. ? Do some form of strengthening exercise, like weight lifting, and some form of aerobic exercise, like running or swimming.  Drinking plenty of water or other low-calorie or no-calorie drinks. Drink 6-8 glasses of water daily, or as much as instructed by your health care provider. Summary  Quitting smoking is a physical and mental challenge. You will face cravings, withdrawal symptoms, and temptation to smoke again. Preparation can help you as you go through these challenges.  You can cope with cravings by keeping your mouth busy (such as by chewing gum), keeping your body and hands busy, and making calls to family, friends, or a helpline for  people who want to quit smoking.  You can cope with withdrawal symptoms by avoiding places where people smoke, avoiding drinks with caffeine, and getting plenty of rest.  Ask your health care provider about the different ways to prevent weight gain, avoid stress, and handle social situations. This information is not intended to replace advice given to you by your health care provider. Make sure you discuss any questions you have with your health care provider. Document Revised: 06/27/2017 Document Reviewed: 07/12/2016 Elsevier  Patient Education  2020 [place] for [employer] Calories are units of energy. Your body needs a certain amount of calories from food to keep you going throughout the day. When you eat more calories than your body needs, your body stores the extra calories as fat. When you eat fewer calories than your body needs, your body burns fat to get the energy it needs. Calorie counting means keeping track of how many calories you eat and drink each day. Calorie counting can be helpful if you need to lose weight. If you make sure to eat fewer calories than your body needs, you should lose weight. Ask your health care provider what a healthy weight is for you. For calorie counting to work, you will need to eat the right number of calories in a day in order to lose a healthy amount of weight per week. A dietitian can help you determine how many calories you need in a day and will give you suggestions on how to reach your calorie goal.  A healthy amount of weight to lose per week is usually 1-2 lb (0.5-0.9 kg). This usually means that your daily calorie intake should be reduced by 500-750 calories.  Eating 1,200 - 1,500 calories per day can help most women lose weight.  Eating 1,500 - 1,800 calories per day can help most men lose weight. What is my plan? My goal is to have __________ calories per day. If I have this many calories per day, I should lose around __________ pounds per week. What do I need to know about calorie counting? In order to meet your daily calorie goal, you will need to:  Find out how many calories are in each food you would like to eat. Try to do this before you eat.  Decide how much of the food you plan to eat.  Write down what you ate and how many calories it had. Doing this is called keeping a food log. To successfully lose weight, it is important to balance calorie counting with a healthy lifestyle that includes regular activity. Aim for 150 minutes of  moderate exercise (such as walking) or 75 minutes of vigorous exercise (such as running) each week. Where do I find calorie information?  The number of calories in a food can be found on a Nutrition Facts label. If a food does not have a Nutrition Facts label, try to look up the calories online or ask your dietitian for help. Remember that calories are listed per serving. If you choose to have more than one serving of a food, you will have to multiply the calories per serving by the amount of servings you plan to eat. For example, the label on a package of bread might say that a serving size is 1 slice and that there are 90 calories in a serving. If you eat 1 slice, you will have eaten 90 calories. If you eat 2 slices, you will have eaten 180 calories. How do I  keep a food log? Immediately after each meal, record the following information in your food log:  What you ate. Don't forget to include toppings, sauces, and other extras on the food.  How much you ate. This can be measured in cups, ounces, or number of items.  How many calories each food and drink had.  The total number of calories in the meal. Keep your food log near you, such as in a small notebook in your pocket, or use a mobile app or website. Some programs will calculate calories for you and show you how many calories you have left for the day to meet your goal. What are some calorie counting tips?   Use your calories on foods and drinks that will fill you up and not leave you hungry: ? Some examples of foods that fill you up are nuts and nut butters, vegetables, lean proteins, and high-fiber foods like whole grains. High-fiber foods are foods with more than 5 g fiber per serving. ? Drinks such as sodas, specialty coffee drinks, alcohol, and juices have a lot of calories, yet do not fill you up.  Eat nutritious foods and avoid empty calories. Empty calories are calories you get from foods or beverages that do not have many  vitamins or protein, such as candy, sweets, and soda. It is better to have a nutritious high-calorie food (such as an avocado) than a food with few nutrients (such as a bag of chips).  Know how many calories are in the foods you eat most often. This will help you calculate calorie counts faster.  Pay attention to calories in drinks. Low-calorie drinks include water and unsweetened drinks.  Pay attention to nutrition labels for "low fat" or "fat free" foods. These foods sometimes have the same amount of calories or more calories than the full fat versions. They also often have added sugar, starch, or salt, to make up for flavor that was removed with the fat.  Find a way of tracking calories that works for you. Get creative. Try different apps or programs if writing down calories does not work for you. What are some portion control tips?  Know how many calories are in a serving. This will help you know how many servings of a certain food you can have.  Use a measuring cup to measure serving sizes. You could also try weighing out portions on a kitchen scale. With time, you will be able to estimate serving sizes for some foods.  Take some time to put servings of different foods on your favorite plates, bowls, and cups so you know what a serving looks like.  Try not to eat straight from a bag or box. Doing this can lead to overeating. Put the amount you would like to eat in a cup or on a plate to make sure you are eating the right portion.  Use smaller plates, glasses, and bowls to prevent overeating.  Try not to multitask (for example, watch TV or use your computer) while eating. If it is time to eat, sit down at a table and enjoy your food. This will help you to know when you are full. It will also help you to be aware of what you are eating and how much you are eating. What are tips for following this plan? Reading food labels  Check the calorie count compared to the serving size. The serving  size may be smaller than what you are used to eating.  Check the source of  the calories. Make sure the food you are eating is high in vitamins and protein and low in saturated and trans fats. Shopping  Read nutrition labels while you shop. This will help you make healthy decisions before you decide to purchase your food.  Make a grocery list and stick to it. Cooking  Try to cook your favorite foods in a healthier way. For example, try baking instead of frying.  Use low-fat dairy products. Meal planning  Use more fruits and vegetables. Half of your plate should be fruits and vegetables.  Include lean proteins like poultry and fish. How do I count calories when eating out?  Ask for smaller portion sizes.  Consider sharing an entree and sides instead of getting your own entree.  If you get your own entree, eat only half. Ask for a box at the beginning of your meal and put the rest of your entree in it so you are not tempted to eat it.  If calories are listed on the menu, choose the lower calorie options.  Choose dishes that include vegetables, fruits, whole grains, low-fat dairy products, and lean protein.  Choose items that are boiled, broiled, grilled, or steamed. Stay away from items that are buttered, battered, fried, or served with cream sauce. Items labeled "crispy" are usually fried, unless stated otherwise.  Choose water, low-fat milk, unsweetened iced tea, or other drinks without added sugar. If you want an alcoholic beverage, choose a lower calorie option such as a glass of wine or light beer.  Ask for dressings, sauces, and syrups on the side. These are usually high in calories, so you should limit the amount you eat.  If you want a salad, choose a garden salad and ask for grilled meats. Avoid extra toppings like bacon, cheese, or fried items. Ask for the dressing on the side, or ask for olive oil and vinegar or lemon to use as dressing.  Estimate how many servings of a  food you are given. For example, a serving of cooked rice is  cup or about the size of half a baseball. Knowing serving sizes will help you be aware of how much food you are eating at restaurants. The list below tells you how big or small some common portion sizes are based on everyday objects: ? 1 oz--4 stacked dice. ? 3 oz--1 deck of cards. ? 1 tsp--1 die. ? 1 Tbsp-- a ping-pong ball. ? 2 Tbsp--1 ping-pong ball. ?  cup-- baseball. ? 1 cup--1 baseball. Summary  Calorie counting means keeping track of how many calories you eat and drink each day. If you eat fewer calories than your body needs, you should lose weight.  A healthy amount of weight to lose per week is usually 1-2 lb (0.5-0.9 kg). This usually means reducing your daily calorie intake by 500-750 calories.  The number of calories in a food can be found on a Nutrition Facts label. If a food does not have a Nutrition Facts label, try to look up the calories online or ask your dietitian for help.  Use your calories on foods and drinks that will fill you up, and not on foods and drinks that will leave you hungry.  Use smaller plates, glasses, and bowls to prevent overeating. This information is not intended to replace advice given to you by your health care provider. Make sure you discuss any questions you have with your health care provider. Document Revised: 04/03/2018 Document Reviewed: 06/14/2016 Elsevier Patient Education  [place].

## 2020-04-19 NOTE — Progress Notes (Signed)
Established Patient Office Visit  Subjective:  Patient ID: Gavin Hopkins, male    DOB: 15-Dec-1955  Age: 64 y.o. MRN: [id number]  CC:  Chief Complaint  Patient presents with  . Annual Exam    CPE, no concerns pt fasting for labs.     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  Blood pressure has been controlled with metoprolol valsartan, HCTZ and verapamil.  Continues lovastatin for control of his cholesterol.  Taking 1 no Norco prior to work at [employer]  Continues to complain about how hard concrete floors are on his hip and back.  He has lower and upper plates and does not see the dentist.  He is up-to-date on his eye checks.  Colonoscopy will be due next year.  Urine flow is okay.  He has nocturia x1.  He continues in sobriety.  He continues to smoke.  Past Medical History:  Diagnosis Date  . Arthritis    oa  . Diabetes mellitus without complication ([place])    type 2  . Displaced fracture of lateral malleolus of right fibula, initial encounter for closed fracture 30 yrs ago   and tibula, full cast applied  . Hypertension   . Sleep apnea    could not tolerate cpap    Past Surgical History:  Procedure Laterality Date  . FRACTURE SURGERY    . LEG SURGERY Right    over 30 yrs ago  . NO PAST SURGERIES    . TOTAL HIP ARTHROPLASTY Right 06/07/2019   Procedure: TOTAL HIP ARTHROPLASTY ANTERIOR APPROACH;  Surgeon: Gaynelle Arabian, MD;  Location: [facility];  Service: Orthopedics;  Laterality: Right;  159min    Family History  Problem Relation Age of Onset  . Hypertension Mother   . Hyperlipidemia Mother   . Diabetes Mother   . Heart attack Mother        stent  . Stroke Mother   . Heart attack Brother   . Diabetes Brother     Social History   Socioeconomic History  . Marital status: Divorced    Spouse name: Not on file  . Number of children: 1   . Years of education: Not on file  . Highest education level: Not on file  Occupational History  . Not on file  Tobacco Use  . Smoking status: Current Every Day Smoker    Packs/day: 0.75    Years: 15.00    Pack years: 11.25    Types: Cigarettes  . Smokeless tobacco: Never Used  Vaping Use  . Vaping Use: Never used  Substance and Sexual Activity  . Alcohol use: Not Currently    Comment: Quit in 1/21.   . Drug use: Never  . Sexual activity: Not on file  Other Topics Concern  . Not on file  Social History Narrative  . Not on file   Social Determinants of Health   Financial Resource Strain:   . Difficulty of Paying Living Expenses: Not on file  Food Insecurity:   . Worried About Charity fundraiser in the Last Year: Not on file  . Ran Out of Food in the Last Year: Not on file  Transportation Needs:   . Lack of Transportation (Medical): Not on file  . Lack of Transportation (Non-Medical): Not on file  Physical Activity:   . Days of Exercise per Week: Not on file  . Minutes of  Exercise per Session: Not on file  Stress:   . Feeling of Stress : Not on file  Social Connections:   . Frequency of Communication with Friends and Family: Not on file  . Frequency of Social Gatherings with Friends and Family: Not on file  . Attends Religious Services: Not on file  . Active Member of Clubs or Organizations: Not on file  . Attends Archivist Meetings: Not on file  . Marital Status: Not on file  Intimate Partner Violence:   . Fear of Current or Ex-Partner: Not on file  . Emotionally Abused: Not on file  . Physically Abused: Not on file  . Sexually Abused: Not on file    Outpatient Medications Prior to Visit  Medication Sig Dispense Refill  . Aromatic Inhalants (VICKS BABYRUB) OINT Apply 1 application topically at bedtime.     . gabapentin (NEURONTIN) 300 MG capsule Take one tablet three times daily. 270 capsule 1  . hydrochlorothiazide (HYDRODIURIL) 12.5 MG tablet  Take 1 tablet (12.5 mg total) by mouth daily. 90 tablet 1  . HYDROcodone-acetaminophen (NORCO) 5-325 MG tablet May take one daily prior to work 90 tablet 0  . lovastatin (MEVACOR) 40 MG tablet TAKE 1 TABLET BY MOUTH EVERYDAY AT BEDTIME 90 tablet 1  . metFORMIN (GLUCOPHAGE) 500 MG tablet TAKE 2 TABLETS BY MOUTH AT NIGHT FOR DIABETES EVERY DAY 180 tablet 0  . metoprolol succinate (TOPROL-XL) 100 MG 24 hr tablet Take 1 tablet (100 mg total) by mouth daily. 90 tablet 1  . Multiple Vitamin (MULTIVITAMIN WITH MINERALS) TABS tablet Take 1 tablet by mouth daily. Centrum Silver for Men 50+    . valsartan (DIOVAN) 320 MG tablet Take 1 tablet (320 mg total) by mouth daily. 90 tablet 0  . verapamil (CALAN-SR) 240 MG CR tablet Take 1 tablet (240 mg total) by mouth at bedtime. 90 tablet 1   No facility-administered medications prior to visit.    No Known Allergies  ROS Review of Systems  Constitutional: Negative.   HENT: Negative.   Eyes: Negative for photophobia and visual disturbance.  Respiratory: Negative for cough, shortness of breath and wheezing.   Cardiovascular: Negative for chest pain and palpitations.  Gastrointestinal: Negative.   Endocrine: Negative for polyphagia and polyuria.  Genitourinary: Negative.   Musculoskeletal: Positive for arthralgias and back pain.  Allergic/Immunologic: Negative for immunocompromised state.  Neurological: Negative for speech difficulty and light-headedness.  Hematological: Does not bruise/bleed easily.  Psychiatric/Behavioral: Negative.    Depression screen [facility] 2/9 04/19/2020 04/19/2020 08/02/2019  Decreased Interest 0 0 0  Down, Depressed, Hopeless 0 0 0  PHQ - 2 Score 0 0 0  Altered sleeping 0 - -  Tired, decreased energy 1 - -  Change in appetite 0 - -  Feeling bad or failure about yourself  0 - -  Trouble concentrating 0 - -  Moving slowly or fidgety/restless 0 - -  Suicidal thoughts 0 - -  PHQ-9 Score 1 - -  Difficult doing work/chores Not  difficult at all - -      Objective:    Physical Exam Vitals and nursing note reviewed.  Constitutional:      General: He is not in acute distress.    Appearance: Normal appearance. He is obese. He is not ill-appearing, toxic-appearing or diaphoretic.  HENT:     Head: Normocephalic and atraumatic.     Right Ear: External ear normal.     Left Ear: External ear normal.  Mouth/Throat:     Mouth: Mucous membranes are moist.     Pharynx: Oropharynx is clear. No oropharyngeal exudate or posterior oropharyngeal erythema.  Eyes:     General: No scleral icterus.       Right eye: No discharge.        Left eye: No discharge.     Conjunctiva/sclera: Conjunctivae normal.     Pupils: Pupils are equal, round, and reactive to light.  Cardiovascular:     Rate and Rhythm: Normal rate and regular rhythm.  Pulmonary:     Effort: Pulmonary effort is normal.     Breath sounds: Normal breath sounds.  Abdominal:     General: Bowel sounds are normal. There is no distension.     Palpations: There is no mass.     Tenderness: There is no abdominal tenderness. There is no guarding or rebound.     Hernia: A hernia is present. Hernia is present in the ventral area.  Genitourinary:    Prostate: Enlarged. Not tender and no nodules present.     Rectum: Guaiac result negative. No mass, tenderness, anal fissure, external hemorrhoid or internal hemorrhoid. Normal anal tone.  Musculoskeletal:     Cervical back: No rigidity or tenderness.  Lymphadenopathy:     Cervical: No cervical adenopathy.  Skin:    General: Skin is warm and dry.  Neurological:     Mental Status: He is alert and oriented to person, place, and time.  Psychiatric:        Mood and Affect: Mood normal.        Behavior: Behavior normal.     BP 118/78   Pulse 70   Temp 97.7 F (36.5 C) (Tympanic)   Ht 5\' 8"  (1.727 m)   Wt 210 lb 6.4 oz (95.4 kg)   SpO2 94%   BMI 31.99 kg/m  Wt Readings from Last 3 Encounters:  04/19/20 210 lb  6.4 oz (95.4 kg)  02/14/20 208 lb 12.8 oz (94.7 kg)  01/27/20 207 lb (93.9 kg)     Health Maintenance Due  Topic Date Due  . Hepatitis C Screening  Never done  . HEMOGLOBIN A1C  11/30/2019    There are no preventive care reminders to display for this patient.  No results found for: TSH Lab Results  Component Value Date   WBC 17.0 (H) 06/08/2019   HGB 13.1 06/08/2019   HCT 39.7 06/08/2019   MCV 98.5 06/08/2019   PLT 230 06/08/2019   Lab Results  Component Value Date   NA 139 08/02/2019   K 4.4 08/02/2019   CO2 27 08/02/2019   GLUCOSE 100 (H) 08/02/2019   BUN 16 08/02/2019   CREATININE 1.19 08/02/2019   BILITOT 0.3 09/02/2019   ALKPHOS 70 09/02/2019   AST 20 09/02/2019   ALT 22 09/02/2019   PROT 7.0 09/02/2019   ALBUMIN 4.3 09/02/2019   CALCIUM 9.9 08/02/2019   ANIONGAP 12 06/08/2019   GFR 61.18 08/02/2019   No results found for: CHOL No results found for: HDL No results found for: LDLCALC No results found for: TRIG No results found for: CHOLHDL Lab Results  Component Value Date   HGBA1C 6.3 (H) 06/02/2019      Assessment & Plan:   Problem List Items Addressed This Visit      Cardiovascular and Mediastinum   Essential hypertension   Relevant Orders   CBC   Basic metabolic panel   Urinalysis, Routine w reflex microscopic   Microalbumin / creatinine  urine ratio     Digestive   Hepatic steatosis - Primary   Relevant Orders   Gamma GT   Hepatic function panel   Lipid panel     Endocrine   Type 2 diabetes mellitus with stage 3a chronic kidney disease, without long-term current use of insulin (HCC)   Relevant Orders   Hemoglobin A1c   Urinalysis, Routine w reflex microscopic   Microalbumin / creatinine urine ratio     Other   Elevated LFTs   Relevant Orders   Hepatic function panel   Hepatitis C antibody   Alcohol use   Relevant Orders   Hepatic function panel   Tobacco abuse   Elevated cholesterol   Relevant Orders   Hepatic function  panel   Lipid panel    Other Visit Diagnoses    Healthcare maintenance       Relevant Orders   PSA   Tobacco use       Relevant Orders   Ambulatory Referral for Lung Cancer Scre      No orders of the defined types were placed in this encounter.   Follow-up: Return in about 3 months (around 07/19/2020).   Patient given information on health maintenance and disease prevention as well as coping with quitting smoking calorie counting to lose weight.  Suggested that he go to the good [employer] store for shoe inserts to help with his hip and lower back pain.  Suggested that weight loss would help as well.  We discussed quitting smoking and I advised him to do so.  We will send him for a low-dose CT scan of his chest.   Libby Maw, MD

## 2020-04-20 ENCOUNTER — Other Ambulatory Visit: Payer: Self-pay | Admitting: Cardiology

## 2020-04-20 DIAGNOSIS — I10 Essential (primary) hypertension: Secondary | ICD-10-CM

## 2020-04-20 LAB — HEPATITIS C ANTIBODY
Hepatitis C Ab: NONREACTIVE
SIGNAL TO CUT-OFF: 0.01 (ref <1.00)

## 2020-04-26 ENCOUNTER — Other Ambulatory Visit: Payer: Self-pay | Admitting: Family Medicine

## 2020-05-10 ENCOUNTER — Other Ambulatory Visit: Payer: Self-pay | Admitting: Family Medicine

## 2020-05-10 MED ORDER — HYDROCODONE-ACETAMINOPHEN 5-325 MG PO TABS
ORAL_TABLET | ORAL | 0 refills | Status: DC
Start: 2020-05-10 — End: 2020-08-06

## 2020-05-10 NOTE — Telephone Encounter (Signed)
Patient aware Rx sent in  

## 2020-05-10 NOTE — Telephone Encounter (Signed)
Patient calling for refill on pending medication. Last OV 04/19/20 last refill July 90 day supply. Please advise.

## 2020-05-10 NOTE — Telephone Encounter (Signed)
Patient is calling and requesting a refill for hydrocodone sent to [facility] in [place], please advise. CB is [phone]

## 2020-05-18 IMAGING — US US ABDOMEN COMPLETE
1 series · 13 of 25 positions shown · non-contrast
Comparison: None.

CLINICAL DATA: Elevated liver enzymes

EXAM:
ABDOMEN ULTRASOUND COMPLETE

[Series 1: us abdomen complete · 0.20mm/px · 13 of 76 slices shown]
[im 1/76]
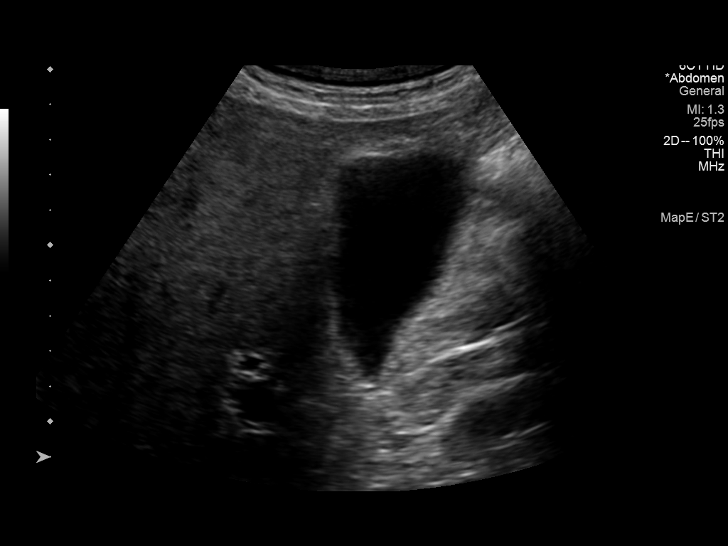
[im 7/76]
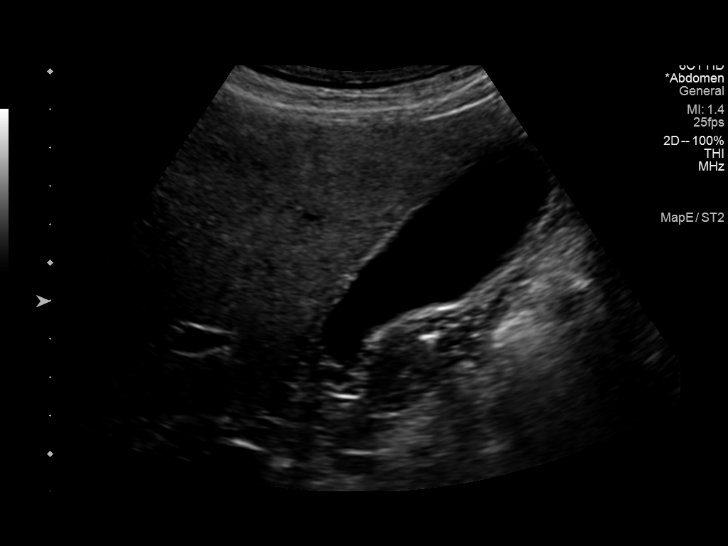
[im 13/76]
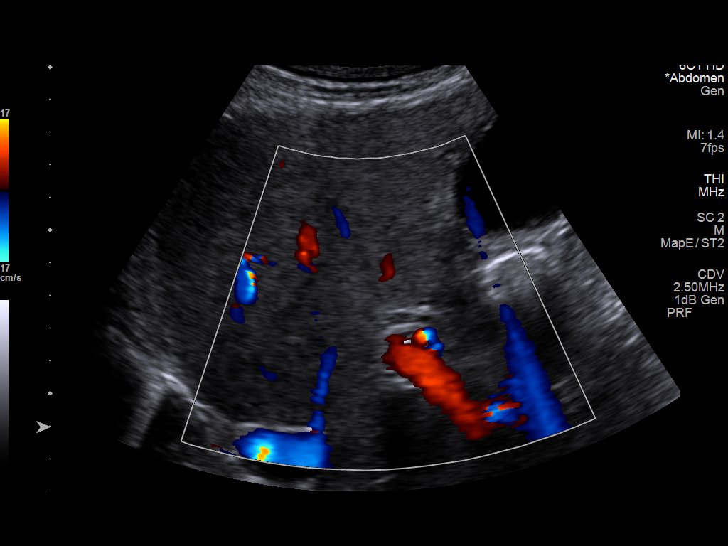
[im 19/76]
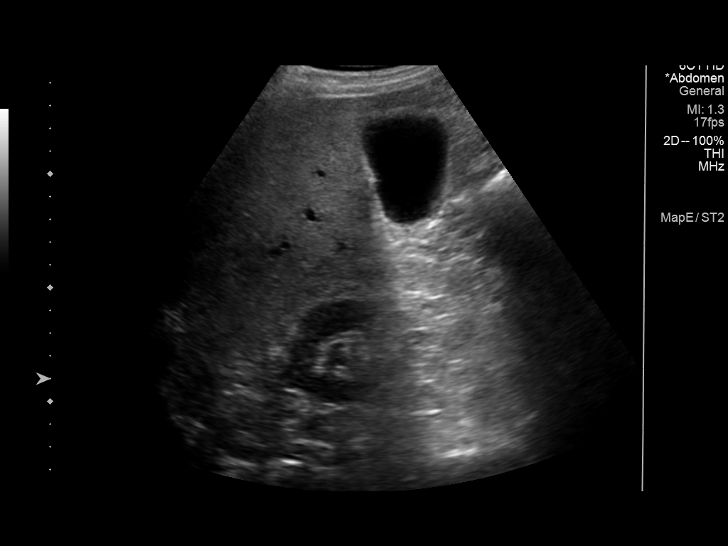
[im 26/76]
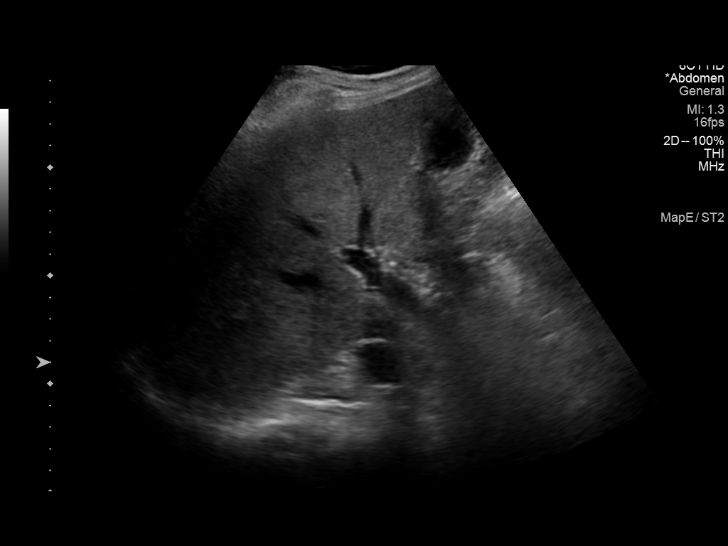
[im 32/76]
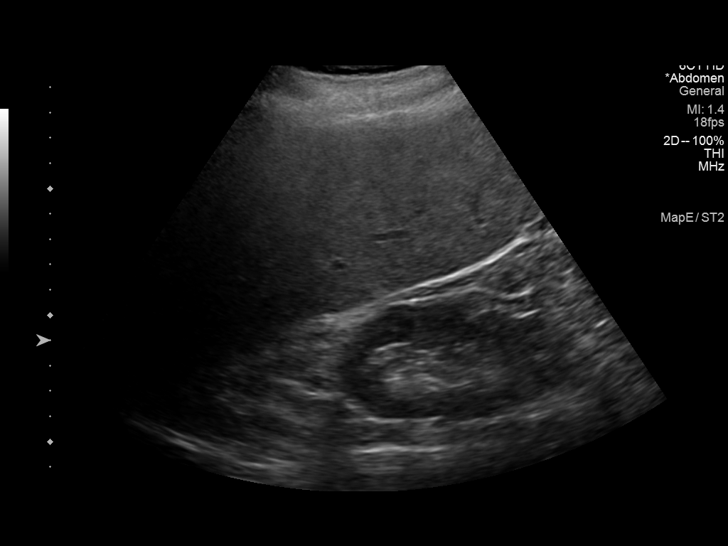
[im 38/76]
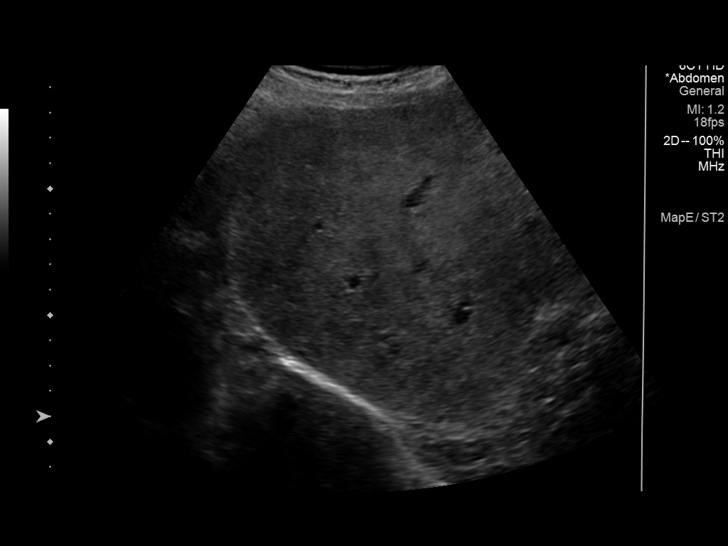
[im 44/76]
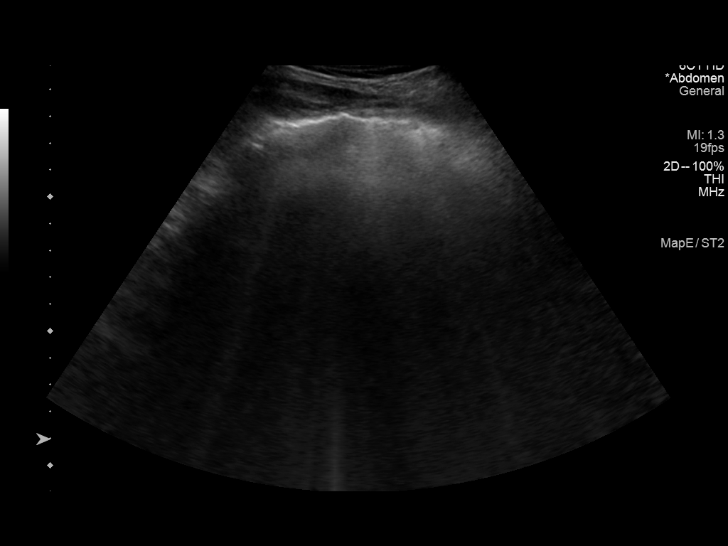
[im 51/76]
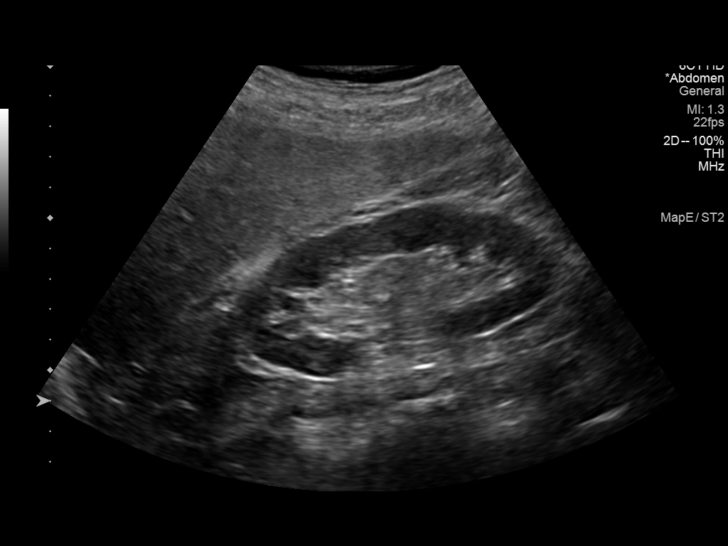
[im 57/76]
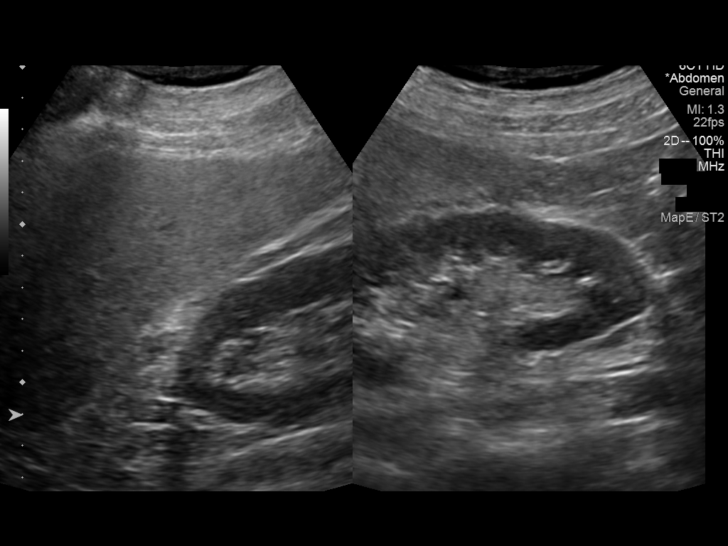
[im 63/76]
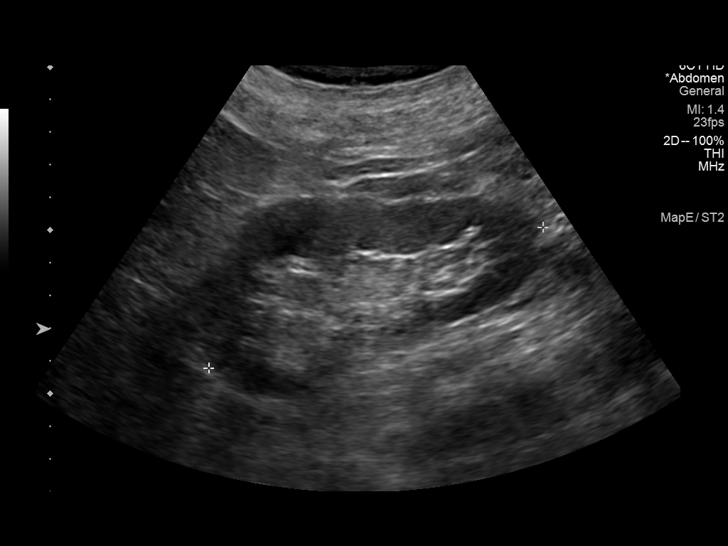
[im 69/76]
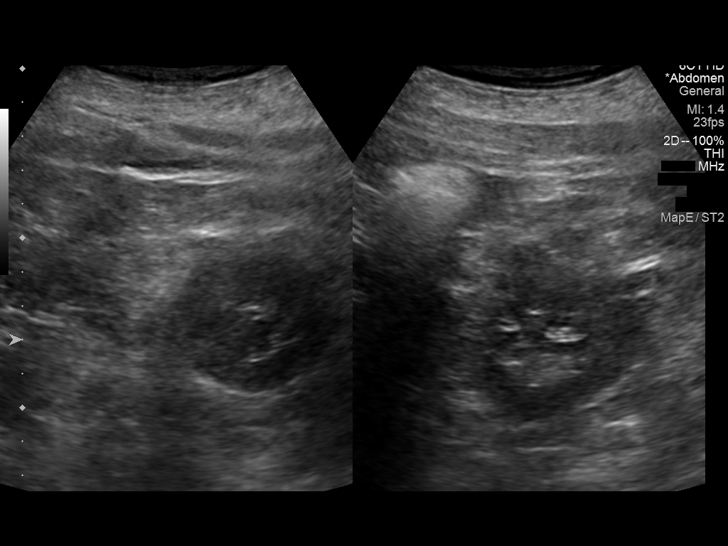
[im 76/76]
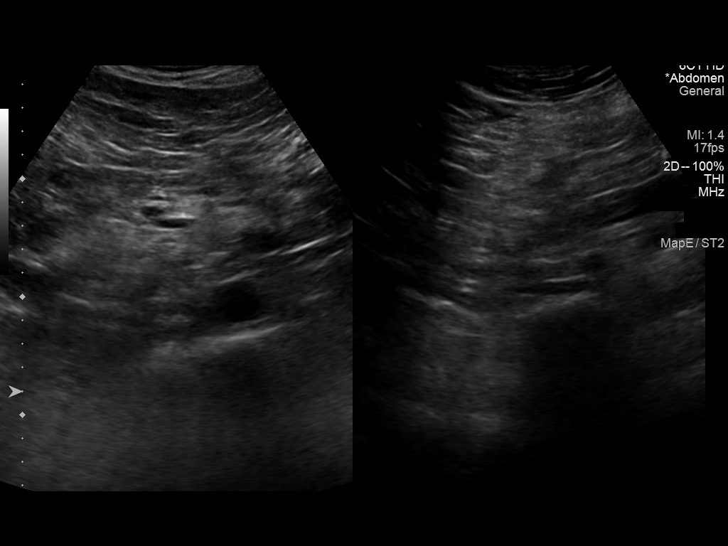

[13 of 25 positions shown; findings below may reference images not displayed]

FINDINGS: Gallbladder: No gallstones or wall thickening visualized. There is
no pericholecystic fluid. No sonographic Murphy sign noted by
sonographer.

Common bile duct: Diameter: 3 mm. No intrahepatic, common hepatic,
or common bile duct dilatation.

Liver: No focal lesion identified. Liver echogenicity overall is
increased. Portal vein is patent on color Doppler imaging with
normal direction of blood flow towards the liver.

IVC: No abnormality visualized.

Pancreas: Essentially completely obscured by gas.

Spleen: Size and appearance within normal limits.

Right Kidney: Length: 11.4 cm. Echogenicity within normal limits. No
mass or hydronephrosis visualized.

Left Kidney: Length: 11.1 cm. Echogenicity within normal limits. No
mass or hydronephrosis visualized.

Abdominal aorta: No aneurysm visualized.

Other findings: No demonstrable ascites.
IMPRESSION: 1. Diffuse increase in liver echogenicity, a finding indicative of
hepatic steatosis. No focal liver lesions are evident; it must be
cautioned that the sensitivity of ultrasound for detection of focal
liver lesions is diminished in this circumstance.

2.  Pancreas essentially completely obscured by gas.

3.  Study otherwise unremarkable.

## 2020-05-22 ENCOUNTER — Other Ambulatory Visit: Payer: Self-pay | Admitting: Cardiology

## 2020-06-28 ENCOUNTER — Other Ambulatory Visit: Payer: Self-pay | Admitting: Cardiology

## 2020-07-15 ENCOUNTER — Other Ambulatory Visit: Payer: Self-pay | Admitting: Cardiology

## 2020-07-15 DIAGNOSIS — I10 Essential (primary) hypertension: Secondary | ICD-10-CM

## 2020-07-20 ENCOUNTER — Other Ambulatory Visit: Payer: Self-pay | Admitting: Family Medicine

## 2020-07-24 ENCOUNTER — Ambulatory Visit: Payer: No Typology Code available for payment source | Admitting: Family Medicine

## 2020-07-28 ENCOUNTER — Other Ambulatory Visit: Payer: Self-pay | Admitting: Cardiology

## 2020-07-31 ENCOUNTER — Encounter: Payer: Self-pay | Admitting: Cardiology

## 2020-07-31 ENCOUNTER — Ambulatory Visit: Payer: No Typology Code available for payment source | Admitting: Cardiology

## 2020-07-31 ENCOUNTER — Other Ambulatory Visit: Payer: Self-pay

## 2020-07-31 VITALS — BP 135/82 | HR 79 | Resp 16 | Ht 68.0 in | Wt 209.2 lb

## 2020-07-31 DIAGNOSIS — E6609 Other obesity due to excess calories: Secondary | ICD-10-CM

## 2020-07-31 DIAGNOSIS — I4510 Unspecified right bundle-branch block: Secondary | ICD-10-CM

## 2020-07-31 DIAGNOSIS — E782 Mixed hyperlipidemia: Secondary | ICD-10-CM

## 2020-07-31 DIAGNOSIS — I10 Essential (primary) hypertension: Secondary | ICD-10-CM

## 2020-07-31 DIAGNOSIS — Z720 Tobacco use: Secondary | ICD-10-CM

## 2020-07-31 DIAGNOSIS — E1165 Type 2 diabetes mellitus with hyperglycemia: Secondary | ICD-10-CM

## 2020-07-31 DIAGNOSIS — I471 Supraventricular tachycardia: Secondary | ICD-10-CM

## 2020-07-31 NOTE — Progress Notes (Signed)
Barbette Hair Date of Birth: [birth year] MRN: [id number] Primary Care Provider:Kremer, Talmadge Coventry, MD Former Cardiology Providers: Altamese Jerome, APRN, FNP-C Primary Cardiologist: Tessa Lerner, DO, [facility] (established care 01/27/2020)  Date: 07/31/20 Last Visit: 01/27/2020  Chief Complaint  Patient presents with  . Follow-up    6 month tachycardia     HPI  Gavin Hopkins is a 65 y.o.  male who presents to the office with a chief complaint of "6 month follow up for tachycardia." Patient's past medical history and cardiovascular risk factors include: hypertension, type 2 diabetes, OSA not on CPAP due to intolerance, current smoker, hyperlipidemia, atrial tachycardia, RBBB.   Patient was originally referred to the office in 2020 for evaluation of tachycardia.  He was originally under the care of Altamese Hampden, APRN, FNP-C and reestablish care with myself as of July 2021.    Patient was started on metoprolol and verapamil and has tolerated both of the medications well without any side effects or intolerances.  EKG today was independently reviewed underlying rhythm normal sinus with rare PVCs.  Symptomatically he does not have any appreciation of palpitations, lightheadedness, dizziness or syncope.    Continues to work full-time at [employer] and plans to retire in the near future.  Since last office visit he denies any hospitalizations or urgent care visits for cardiovascular symptoms.  Unfortunately, patient continues to smoke three fourths of a pack per day despite educating him on the importance of complete smoking cessation during prior office visits.   FUNCTIONAL STATUS: Continues to work full time at [employer]. He is on his feet throughout the day. No structured exercise program or daily routine.    ALLERGIES: No Known Allergies   MEDICATION LIST PRIOR TO VISIT: Current Outpatient Medications on File Prior to Visit  Medication Sig Dispense Refill  . Aromatic Inhalants  (VICKS BABYRUB) OINT Apply 1 application topically at bedtime.     Marland Kitchen aspirin EC 81 MG tablet Take 81 mg by mouth daily. Swallow whole.    . gabapentin (NEURONTIN) 300 MG capsule Take one tablet three times daily. 270 capsule 1  . hydrochlorothiazide (HYDRODIURIL) 12.5 MG tablet TAKE 1 TABLET BY MOUTH EVERY DAY 90 tablet 1  . HYDROcodone-acetaminophen (NORCO) 5-325 MG tablet May take one daily prior to work 90 tablet 0  . lovastatin (MEVACOR) 40 MG tablet TAKE 1 TABLET BY MOUTH EVERYDAY AT BEDTIME 90 tablet 1  . metFORMIN (GLUCOPHAGE) 500 MG tablet TAKE 2 TABLETS (1,000 MG TOTAL) BY MOUTH DAILY IN THE AFTERNOON. 180 tablet 0  . metoprolol succinate (TOPROL-XL) 100 MG 24 hr tablet TAKE 1 TABLET BY MOUTH EVERY DAY 90 tablet 1  . Multiple Vitamin (MULTIVITAMIN WITH MINERALS) TABS tablet Take 1 tablet by mouth daily. Centrum Silver for Men 50+    . valsartan (DIOVAN) 320 MG tablet TAKE 1 TABLET BY MOUTH EVERY DAY 90 tablet 0  . verapamil (CALAN-SR) 240 MG CR tablet TAKE 1 TABLET BY MOUTH AT BEDTIME. 90 tablet 1   No current facility-administered medications on file prior to visit.    PAST MEDICAL HISTORY: Past Medical History:  Diagnosis Date  . Arthritis    oa  . Diabetes mellitus without complication (HCC)    type 2  . Displaced fracture of lateral malleolus of right fibula, initial encounter for closed fracture 30 yrs ago   and tibula, full cast applied  . Hypertension   . Sleep apnea    could not tolerate cpap    PAST SURGICAL HISTORY: Past  Surgical History:  Procedure Laterality Date  . FRACTURE SURGERY    . LEG SURGERY Right    over 30 yrs ago  . NO PAST SURGERIES    . TOTAL HIP ARTHROPLASTY Right 06/07/2019   Procedure: TOTAL HIP ARTHROPLASTY ANTERIOR APPROACH;  Surgeon: Gaynelle Arabian, MD;  Location: [facility];  Service: Orthopedics;  Laterality: Right;  179min    FAMILY HISTORY: The patient's family history includes Diabetes in his brother and mother; Heart attack in his  brother and mother; Hyperlipidemia in his mother; Hypertension in his mother; Stroke in his mother.   SOCIAL HISTORY:  The patient  reports that he has been smoking cigarettes. He has a 11.25 pack-year smoking history. He has never used smokeless tobacco. He reports previous alcohol use. He reports that he does not use drugs.  Review of Systems  Constitutional: Negative for chills and fever.  HENT: Negative for hoarse voice and nosebleeds.   Eyes: Negative for discharge, double vision and pain.  Cardiovascular: Negative for chest pain, claudication, dyspnea on exertion, leg swelling, near-syncope, orthopnea, palpitations, paroxysmal nocturnal dyspnea and syncope.  Respiratory: Positive for snoring. Negative for hemoptysis and shortness of breath.   Musculoskeletal: Negative for muscle cramps and myalgias.  Gastrointestinal: Negative for abdominal pain, constipation, diarrhea, hematemesis, hematochezia, melena, nausea and vomiting.  Neurological: Positive for dizziness. Negative for light-headedness.    PHYSICAL EXAM: Vitals with BMI 07/31/2020 04/19/2020 02/14/2020  Height 5\' 8"  5\' 8"  5\' 8"   Weight 209 lbs 3 oz 210 lbs 6 oz 208 lbs 13 oz  BMI [id number] 32 [id number]  Systolic [id number]  Diastolic 82 78 74  Pulse 79 70 81    CONSTITUTIONAL: Well-developed and well-nourished. No acute distress.  SKIN: Skin is warm and dry. No rash noted. No cyanosis. No pallor. No jaundice HEAD: Normocephalic and atraumatic.  EYES: No scleral icterus MOUTH/THROAT: Moist oral membranes.  NECK: No JVD present. No thyromegaly noted. No carotid bruits  LYMPHATIC: No visible cervical adenopathy.  CHEST Normal respiratory effort. No intercostal retractions  LUNGS: Clear to auscultation bilaterally.  No stridor. No wheezes. No rales.  CARDIOVASCULAR: Regular rate and rhythm, positive S1-S2, no murmurs rubs or gallops appreciated ABDOMINAL: Obese, soft, nontender, nondistended, positive bowel sounds in all 4  quadrants.  No apparent ascites.  EXTREMITIES: No peripheral edema  HEMATOLOGIC: No significant bruising NEUROLOGIC: Oriented to person, place, and time. Nonfocal. Normal muscle tone.  PSYCHIATRIC: Normal mood and affect. Normal behavior. Cooperative  CARDIAC DATABASE: EKG: 07/31/2020: Normal sinus rhythm, 74 bpm, right bundle branch block, rare PVCs, left axis deviation, left anterior fascicular block.  Echocardiogram: 05/06/2019: LVEF [id number], grade 1 diastolic impairment, normal LAP, normal left atrial size, lipomatous hypertrophy of the intra-atrial septum, anterior fat pad, aortic root 3.9 cm.  Stress Testing:  Lexiscan Myoview Stress Test10/06/2019: 1. Resting EKG normal sinus rhythm, left axis deviation, left anterior fascicular block, right bundle branch block. Low-voltage complexes. Stress EKG nondiagnostic due to Lexiscan infusion. 2. Nuclear perfusion reveals diaphragmatic attenuation in the inferior wall. There is no demonstrable ischemia or infarct. LVEF was 58% without wall motion abnormality. This is a low risk stress test.  Heart Catheterization: None  Carotid duplex: 02/10/2020:  No hemodynamically significant arterial disease in the internal carotid artery bilaterally. Mild increase in velocity in bilateral external carotid arteries without plaque noted. May indicate <50% stenosis. There is minimal plaque in carotid arteries.  Right vertebral artery flow is not visualized. Antegrade left vertebral artery flow.  Follow up studies if  clinically indicated.   LABORATORY DATA: CBC Latest Ref Rng & Units 04/19/2020 06/08/2019 06/02/2019  WBC 4.0 - 10.5 K/uL 10.5 17.0(H) 12.7(H)  Hemoglobin 13.0 - 17.0 g/dL 16.6 13.1 15.1  Hematocrit 39.0 - 52.0 % 49.4 39.7 45.7  Platelets 150.0 - 400.0 K/uL 282.0 230 254    CMP Latest Ref Rng & Units 04/19/2020 09/02/2019 08/02/2019  Glucose 70 - 99 mg/dL 99 - 100(H)  BUN 6 - 23 mg/dL 21 - 16  Creatinine 0.40 - 1.50 mg/dL 1.35 - 1.19   Sodium 135 - 145 mEq/L 136 - 139  Potassium 3.5 - 5.1 mEq/L 4.6 - 4.4  Chloride 96 - 112 mEq/L 101 - 101  CO2 19 - 32 mEq/L 27 - 27  Calcium 8.4 - 10.5 mg/dL 9.8 - 9.9  Total Protein 6.0 - 8.3 g/dL 7.3 7.0 7.0  Total Bilirubin 0.2 - 1.2 mg/dL 0.4 0.3 0.4  Alkaline Phos 39 - 117 U/L 75 70 75  AST 0 - 37 U/L 22 20 28   ALT 0 - 53 U/L 23 22 43    Lipid Panel     Component Value Date/Time   CHOL 137 04/19/2020 0824   TRIG 163.0 (H) 04/19/2020 0824   HDL 37.50 (L) 04/19/2020 0824   CHOLHDL 4 04/19/2020 0824   VLDL 32.6 04/19/2020 0824   LDLCALC 67 04/19/2020 0824    Lab Results  Component Value Date   HGBA1C 6.4 04/19/2020   HGBA1C 6.3 (H) 06/02/2019   HGBA1C 6.3 (H) 03/29/2019   No components found for: NTPROBNP No results found for: TSH  Cardiac Panel (last 3 results) No results for input(s): CKTOTAL, CKMB, TROPONINIHS, RELINDX in the last 72 hours.  IMPRESSION:    ICD-10-CM   1. Ectopic atrial tachycardia (HCC)  I47.1 EKG 12-Lead  2. RBBB  I45.10 EKG 12-Lead  3. Benign essential hypertension  I10   4. Tobacco use  Z72.0   5. Mixed hyperlipidemia  E78.2   6. Type 2 diabetes mellitus with hyperglycemia, with long-term current use of insulin (HCC)  E11.65    Z79.4   7. Class 1 obesity due to excess calories with serious comorbidity and body mass index (BMI) of 31.0 to 31.9 in adult  E66.09    Z68.31      RECOMMENDATIONS: Manual Broas is a 65 y.o. male whose past medical history and cardiovascular risk factors include:  hypertension, type 2 diabetes, OSA not on CPAP due to intolerance, current smoker, hyperlipidemia, atrial tachycardia, RBBB.   History of ectopic atrial tachycardia:  Currently on both metoprolol and verapamil and remains asymptomatic.  Patient is able to tolerate both the renal blocking agents without headaches episodes of feeling tired, depressed, or syncope.  Despite being on both AV blocking agents he continues to have rare ventricular  ectopy.  Since he is asymptomatic would continue to monitor for now.  Benign essential hypertension:  Currently his blood pressure is at goal.  Continue current medical therapy.  Medications reconciled.  Educated importance of a low-salt diet.  Patient is informed to call the office if his systolic blood pressures are consistently greater than 140 mmHg.  Dizziness: Resolved  Reviewed the carotid duplex results with the patient at today's office visit.  Findings noted above.    Cigarette smoking:  Tobacco cessation counseling:  Currently smoking 0.75 packs/day    Patient was informed of the dangers of tobacco abuse including stroke, cancer, and MI, as well as benefits of tobacco cessation.  Patient is willing  to quit at this time.  Approximately 8 mins were spent counseling patient cessation techniques. We discussed various methods to help quit smoking, including deciding on a date to quit, joining a support group, pharmacological agents- nicotine gum/patch/lozenges, chantix.   I will reassess his progress at the next follow-up visit  Insulin-dependent diabetes mellitus type 2:   Educated him on the importance of glycemic control to prevent cardiovascular events.  Independently reviewed last hemoglobin A1c from September 21.  Currently managed by primary care provider.  FINAL MEDICATION LIST END OF ENCOUNTER: No orders of the defined types were placed in this encounter.   There are no discontinued medications.   Current Outpatient Medications:  .  Aromatic Inhalants (VICKS BABYRUB) OINT, Apply 1 application topically at bedtime. , Disp: , Rfl:  .  aspirin EC 81 MG tablet, Take 81 mg by mouth daily. Swallow whole., Disp: , Rfl:  .  gabapentin (NEURONTIN) 300 MG capsule, Take one tablet three times daily., Disp: 270 capsule, Rfl: 1 .  hydrochlorothiazide (HYDRODIURIL) 12.5 MG tablet, TAKE 1 TABLET BY MOUTH EVERY DAY, Disp: 90 tablet, Rfl: 1 .  HYDROcodone-acetaminophen  (NORCO) 5-325 MG tablet, May take one daily prior to work, Disp: 90 tablet, Rfl: 0 .  lovastatin (MEVACOR) 40 MG tablet, TAKE 1 TABLET BY MOUTH EVERYDAY AT BEDTIME, Disp: 90 tablet, Rfl: 1 .  metFORMIN (GLUCOPHAGE) 500 MG tablet, TAKE 2 TABLETS (1,000 MG TOTAL) BY MOUTH DAILY IN THE AFTERNOON., Disp: 180 tablet, Rfl: 0 .  metoprolol succinate (TOPROL-XL) 100 MG 24 hr tablet, TAKE 1 TABLET BY MOUTH EVERY DAY, Disp: 90 tablet, Rfl: 1 .  Multiple Vitamin (MULTIVITAMIN WITH MINERALS) TABS tablet, Take 1 tablet by mouth daily. Centrum Silver for Men 50+, Disp: , Rfl:  .  valsartan (DIOVAN) 320 MG tablet, TAKE 1 TABLET BY MOUTH EVERY DAY, Disp: 90 tablet, Rfl: 0 .  verapamil (CALAN-SR) 240 MG CR tablet, TAKE 1 TABLET BY MOUTH AT BEDTIME., Disp: 90 tablet, Rfl: 1  Orders Placed This Encounter  Procedures  . EKG 12-Lead   --Continue cardiac medications as reconciled in final medication list. --Return in about 6 months (around 01/28/2021) for Follow up tachycardia. . Or sooner if needed. --Continue follow-up with your primary care physician regarding the management of your other chronic comorbid conditions.  Patient's questions and concerns were addressed to his satisfaction. He voices understanding of the instructions provided during this encounter.   Time spent: 32 minutes.  This note was created using a voice recognition software as a result there may be grammatical errors inadvertently enclosed that do not reflect the nature of this encounter. Every attempt is made to correct such errors.  Rex Kras, [place], [facility]  Pager: [phone] Office: [phone]

## 2020-08-04 ENCOUNTER — Telehealth: Payer: Self-pay

## 2020-08-04 NOTE — Telephone Encounter (Signed)
Last OV 04/19/20 Last fill 05/10/20 #90/0

## 2020-08-06 MED ORDER — HYDROCODONE-ACETAMINOPHEN 5-325 MG PO TABS: ORAL_TABLET | ORAL | 0 refills | Status: DC

## 2020-08-31 ENCOUNTER — Ambulatory Visit: Payer: No Typology Code available for payment source | Admitting: Family Medicine

## 2020-09-01 ENCOUNTER — Other Ambulatory Visit: Payer: Self-pay | Admitting: Family Medicine

## 2020-09-01 DIAGNOSIS — G8929 Other chronic pain: Secondary | ICD-10-CM

## 2020-09-01 DIAGNOSIS — M545 Low back pain, unspecified: Secondary | ICD-10-CM

## 2020-09-01 DIAGNOSIS — G5693 Unspecified mononeuropathy of bilateral upper limbs: Secondary | ICD-10-CM

## 2020-09-01 NOTE — Telephone Encounter (Signed)
Pt also called request in today. Last OV 04/19/20 Last fill 02/28/20  #270/1

## 2020-09-14 ENCOUNTER — Other Ambulatory Visit: Payer: Self-pay

## 2020-09-14 MED ORDER — LOVASTATIN 40 MG PO TABS
ORAL_TABLET | ORAL | 0 refills | Status: DC
Start: 2020-09-14 — End: 2020-12-10

## 2020-09-14 NOTE — Telephone Encounter (Signed)
Last OV 04/19/20 Last fill 03/25/20  #90/1 Next OV 10/06/20

## 2020-10-05 ENCOUNTER — Other Ambulatory Visit: Payer: Self-pay

## 2020-10-06 ENCOUNTER — Ambulatory Visit: Payer: No Typology Code available for payment source | Admitting: Family Medicine

## 2020-10-06 ENCOUNTER — Encounter: Payer: Self-pay | Admitting: Family Medicine

## 2020-10-06 VITALS — BP 128/80 | HR 88 | Temp 97.7°F | Ht 68.0 in | Wt 212.2 lb

## 2020-10-06 DIAGNOSIS — Z720 Tobacco use: Secondary | ICD-10-CM | POA: Diagnosis not present

## 2020-10-06 DIAGNOSIS — E7800 Pure hypercholesterolemia, unspecified: Secondary | ICD-10-CM

## 2020-10-06 DIAGNOSIS — E1122 Type 2 diabetes mellitus with diabetic chronic kidney disease: Secondary | ICD-10-CM | POA: Diagnosis not present

## 2020-10-06 DIAGNOSIS — M545 Low back pain, unspecified: Secondary | ICD-10-CM

## 2020-10-06 DIAGNOSIS — I10 Essential (primary) hypertension: Secondary | ICD-10-CM

## 2020-10-06 DIAGNOSIS — N1831 Chronic kidney disease, stage 3a: Secondary | ICD-10-CM

## 2020-10-06 DIAGNOSIS — G8929 Other chronic pain: Secondary | ICD-10-CM

## 2020-10-06 NOTE — Progress Notes (Signed)
[facility] [address] [place] [address] Dept: [phone] Dept Fax: [phone]  Chronic Care Office Visit  Subjective:    Patient ID: Gavin Hopkins, male    DOB: Sep 16, 1955, 65 y.o..   MRN: [id number]  Chief Complaint  Patient presents with  . Follow-up    3 month f/u, no concerns or refills.  Declines both flu and covid vaccines.     History of Present Illness:  Patient is in today for reassessment of chronic medical issues. Mr. Kiang has a history of hypertension, type 2 diabetes, and elevated cholesterol. He is on multiple medications for his BP (HCTZ, metoprolol, verapamil, and valsartan). He takes metformin for his diabetes and is on lovastatin for his cholesterol. Mr. Lantigua has a history of some past tachycardia. He notes the cardiologist had started him on one of the medications to control his rate (possible the metoprolol or the verapamil). It appears he may have been tried on an ACE-I in the past, but had cough develop.  Mr. Crihfield has a history of chronic low back pain. He has been on long-standing therapy with Norco and gabapentin for this. He notes that he was previously on the Norco 7.5/325, but Dr. Ethelene Hal worked with him to lower to the 5/325 dose. He only takes this medication once a day, at bedtime. He tried stopping the medication all together, but had significant flu-like symptoms from that.  Mr. Keeling has had a prior total right hip joint replacement. He notes he has occasional right hip pains. He recognizes he seems to be past due for an annual re-evaluation with his Psychologist, sport and exercise. He has had some occasional pain with hip flexion in recent months. He works at [employer] and does some considerable walking on hard surfaces each day.  Mr. Schoenberg  smokes about 1/3 ppd of cigarettes. he has been smoking since a very young age. He notes he is able to not smoke for extended periods during the  day and around family, but always reverts to it at home. He is currently divorced. He does understand the importance of smoking cessation for his health.  Past Medical History: Patient Active Problem List   Diagnosis Date Noted  . Tobacco abuse 02/14/2020  . Elevated cholesterol 02/14/2020  . Chronic midline low back pain without sciatica 10/12/2019  . Osteoarthritis 10/12/2019  . Hepatic steatosis 08/19/2019  . Alcohol abuse 08/19/2019  . Essential hypertension 06/28/2019  . Type 2 diabetes mellitus with stage 3a chronic kidney disease, without long-term current use of insulin ([employer]) 06/28/2019  . Sinus arrhythmia 06/28/2019  . Elevated LFTs 06/28/2019  . Chronic prescription opiate use 06/28/2019  . Neuropathy of both upper extremities 06/28/2019  . History of revision of total replacement of right hip joint 06/22/2019  . Primary osteoarthritis of right hip 06/07/2019   Past Surgical History:  Procedure Laterality Date  . FRACTURE SURGERY    . LEG SURGERY Right    over 30 yrs ago  . NO PAST SURGERIES    . TOTAL HIP ARTHROPLASTY Right 06/07/2019   Procedure: TOTAL HIP ARTHROPLASTY ANTERIOR APPROACH;  Surgeon: Gaynelle Arabian, MD;  Location: [facility];  Service: Orthopedics;  Laterality: Right;  170min   Family History  Problem Relation Age of Onset  . Hypertension Mother   . Hyperlipidemia Mother   . Diabetes Mother   . Heart attack Mother        stent  . Stroke Mother   . Heart attack Brother   .  Diabetes Brother    Outpatient Medications Prior to Visit  Medication Sig Dispense Refill  . Aromatic Inhalants (VICKS BABYRUB) OINT Apply 1 application topically at bedtime.     Marland Kitchen aspirin EC 81 MG tablet Take 81 mg by mouth daily. Swallow whole.    . gabapentin (NEURONTIN) 300 MG capsule TAKE 1 CAPSULE BY MOUTH THREE TIMES A DAY 270 capsule 1  . hydrochlorothiazide (HYDRODIURIL) 12.5 MG tablet TAKE 1 TABLET BY MOUTH EVERY DAY 90 tablet 1  . HYDROcodone-acetaminophen (NORCO) 5-325  MG tablet May take one daily prior to work 90 tablet 0  . lovastatin (MEVACOR) 40 MG tablet TAKE 1 TABLET BY MOUTH EVERYDAY AT BEDTIME 90 tablet 0  . metFORMIN (GLUCOPHAGE) 500 MG tablet TAKE 2 TABLETS (1,000 MG TOTAL) BY MOUTH DAILY IN THE AFTERNOON. 180 tablet 0  . metoprolol succinate (TOPROL-XL) 100 MG 24 hr tablet TAKE 1 TABLET BY MOUTH EVERY DAY 90 tablet 1  . Multiple Vitamin (MULTIVITAMIN WITH MINERALS) TABS tablet Take 1 tablet by mouth daily. Centrum Silver for Men 50+    . valsartan (DIOVAN) 320 MG tablet TAKE 1 TABLET BY MOUTH EVERY DAY 90 tablet 0  . verapamil (CALAN-SR) 240 MG CR tablet TAKE 1 TABLET BY MOUTH AT BEDTIME. 90 tablet 1   No facility-administered medications prior to visit.   No Known Allergies    Objective:   Today's Vitals   10/06/20 1355  BP: 128/80  Pulse: 88  Temp: 97.7 F (36.5 C)  TempSrc: Temporal  SpO2: 93%  Weight: 212 lb 3.2 oz (96.3 kg)  Height: 5\' 8"  (1.727 m)   Body mass index is 32.26 kg/m.   General: Well developed, well nourished. No acute distress. CV- Regular rate and rhythm without murmurs or rubs. Pulses 2+ Psych: Alert and oriented. Normal mood and affect.  Health Maintenance Due  Topic Date Due  . COVID-19 Vaccine (1) Never done  . PNA vac Low Risk Adult (2 of 2 - PCV13) 06/27/2020  . OPHTHALMOLOGY EXAM  07/18/2020     Assessment & Plan:   1. Type 2 diabetes mellitus with stage 3a chronic kidney disease, without long-term current use of insulin [facility]) Mr. Schnitzer last HbA1c was 6.4. We will reassess this today. He appears stable on metformin.  - Hemoglobin A1c; Future  2. Essential hypertension I reviewed notes from Dr. Terri Skains (Cardiology). These clarify that the verapamil and metoprolol were primarily prescribed to control an atrial tachycardia. Blood pressure is at goal on his four-drug therapy. 3. Elevated cholesterol Past history of hyperlipidemia with good control. Due for monitoring.  - Lipid panel;  Future  4. Tobacco abuse I advised Mr. Davoli about his need to stop smoking due to risk for cardiovascular disease. We discussed various ways that he might approach breaking habits surrounding smoking. I encouraged him to continue to attempt to quit.  5. Chronic midline low back pain without sciatica Mr. Aletha Halim is on chronic low-dose opioid therapy. We discussed him attempting to reduce his use to every other day, to see if he could tolerate this regimen, before considering stopping completely. He seemed agreeable to try this approach.  Haydee Salter, MD

## 2020-10-10 ENCOUNTER — Other Ambulatory Visit: Payer: Self-pay | Admitting: Family

## 2020-10-10 ENCOUNTER — Other Ambulatory Visit: Payer: Self-pay

## 2020-10-10 ENCOUNTER — Other Ambulatory Visit (INDEPENDENT_AMBULATORY_CARE_PROVIDER_SITE_OTHER): Payer: No Typology Code available for payment source

## 2020-10-10 ENCOUNTER — Other Ambulatory Visit: Payer: Self-pay | Admitting: Cardiology

## 2020-10-10 DIAGNOSIS — N1831 Chronic kidney disease, stage 3a: Secondary | ICD-10-CM

## 2020-10-10 DIAGNOSIS — E7800 Pure hypercholesterolemia, unspecified: Secondary | ICD-10-CM | POA: Diagnosis not present

## 2020-10-10 DIAGNOSIS — E1122 Type 2 diabetes mellitus with diabetic chronic kidney disease: Secondary | ICD-10-CM

## 2020-10-10 DIAGNOSIS — I10 Essential (primary) hypertension: Secondary | ICD-10-CM

## 2020-10-10 LAB — HEMOGLOBIN A1C: Hgb A1c MFr Bld: 6.5 % (ref 4.6–6.5)

## 2020-10-10 LAB — LDL CHOLESTEROL, DIRECT: Direct LDL: 89 mg/dL

## 2020-10-10 LAB — LIPID PANEL
Cholesterol: 149 mg/dL (ref 0–200)
HDL: 44.1 mg/dL (ref >39.00)
NonHDL: 105.2
Total CHOL/HDL Ratio: 3
Triglycerides: 209 mg/dL — ABNORMAL HIGH (ref 0.0–149.0)
VLDL: 41.8 mg/dL — ABNORMAL HIGH (ref 0.0–40.0)

## 2020-11-06 ENCOUNTER — Telehealth: Payer: Self-pay | Admitting: Family Medicine

## 2020-11-06 DIAGNOSIS — M1611 Unilateral primary osteoarthritis, right hip: Secondary | ICD-10-CM

## 2020-11-06 MED ORDER — HYDROCODONE-ACETAMINOPHEN 5-325 MG PO TABS: ORAL_TABLET | ORAL | 0 refills | Status: DC

## 2020-11-06 NOTE — Telephone Encounter (Signed)
Refill request for  Hydrocodone/Acet 5/325 mg LR 08/06/20, #90, 0 rf LOV 10/06/20 FOV 01/11/21  Please review and advise.  Thanks. Dm/cma

## 2020-11-06 NOTE — Telephone Encounter (Signed)
Langley Gauss,  Would you please remind him that he is only supposed to be using this medication on the days when he works.

## 2020-11-06 NOTE — Telephone Encounter (Signed)
Patient called to get a refill on his Hydrocodone. If approved, please send to  [facility] in [place] and call him at  [phone] to let him know that it has been sent in.

## 2020-11-07 NOTE — Telephone Encounter (Signed)
Patient notified [place] phone.  He states that he didn't remember that ever being said to him "to only take them on the days he works".  Dm/cma

## 2020-11-23 ENCOUNTER — Other Ambulatory Visit: Payer: Self-pay | Admitting: Cardiology

## 2020-12-09 ENCOUNTER — Other Ambulatory Visit: Payer: Self-pay | Admitting: Family Medicine

## 2020-12-23 ENCOUNTER — Other Ambulatory Visit: Payer: Self-pay | Admitting: Cardiology

## 2021-01-07 ENCOUNTER — Other Ambulatory Visit: Payer: Self-pay | Admitting: Cardiology

## 2021-01-07 ENCOUNTER — Other Ambulatory Visit: Payer: Self-pay | Admitting: Family

## 2021-01-07 DIAGNOSIS — I10 Essential (primary) hypertension: Secondary | ICD-10-CM

## 2021-01-08 ENCOUNTER — Ambulatory Visit: Payer: No Typology Code available for payment source | Admitting: Family Medicine

## 2021-01-08 ENCOUNTER — Encounter: Payer: Self-pay | Admitting: Family Medicine

## 2021-01-08 ENCOUNTER — Other Ambulatory Visit: Payer: Self-pay

## 2021-01-08 VITALS — BP 120/72 | HR 54 | Temp 97.2°F | Ht 68.0 in | Wt 208.8 lb

## 2021-01-08 DIAGNOSIS — E663 Overweight: Secondary | ICD-10-CM | POA: Insufficient documentation

## 2021-01-08 DIAGNOSIS — G8929 Other chronic pain: Secondary | ICD-10-CM

## 2021-01-08 DIAGNOSIS — E785 Hyperlipidemia, unspecified: Secondary | ICD-10-CM

## 2021-01-08 DIAGNOSIS — E1122 Type 2 diabetes mellitus with diabetic chronic kidney disease: Secondary | ICD-10-CM

## 2021-01-08 DIAGNOSIS — M545 Low back pain, unspecified: Secondary | ICD-10-CM

## 2021-01-08 DIAGNOSIS — I471 Supraventricular tachycardia: Secondary | ICD-10-CM | POA: Diagnosis not present

## 2021-01-08 DIAGNOSIS — E1169 Type 2 diabetes mellitus with other specified complication: Secondary | ICD-10-CM | POA: Diagnosis not present

## 2021-01-08 DIAGNOSIS — G5693 Unspecified mononeuropathy of bilateral upper limbs: Secondary | ICD-10-CM

## 2021-01-08 DIAGNOSIS — I10 Essential (primary) hypertension: Secondary | ICD-10-CM | POA: Diagnosis not present

## 2021-01-08 DIAGNOSIS — E669 Obesity, unspecified: Secondary | ICD-10-CM | POA: Insufficient documentation

## 2021-01-08 DIAGNOSIS — Z23 Encounter for immunization: Secondary | ICD-10-CM | POA: Diagnosis not present

## 2021-01-08 DIAGNOSIS — N1831 Chronic kidney disease, stage 3a: Secondary | ICD-10-CM

## 2021-01-08 DIAGNOSIS — K760 Fatty (change of) liver, not elsewhere classified: Secondary | ICD-10-CM

## 2021-01-08 DIAGNOSIS — Z720 Tobacco use: Secondary | ICD-10-CM

## 2021-01-08 LAB — COMPREHENSIVE METABOLIC PANEL
ALT: 30 U/L (ref 0–53)
AST: 23 U/L (ref 0–37)
Albumin: 4.3 g/dL (ref 3.5–5.2)
Alkaline Phosphatase: 66 U/L (ref 39–117)
BUN: 20 mg/dL (ref 6–23)
CO2: 29 mEq/L (ref 19–32)
Calcium: 10 mg/dL (ref 8.4–10.5)
Chloride: 102 mEq/L (ref 96–112)
Creatinine, Ser: 1.29 mg/dL (ref 0.40–1.50)
GFR: 57.44 mL/min — ABNORMAL LOW (ref >60.00)
Glucose, Bld: 91 mg/dL (ref 70–99)
Potassium: 4.6 mEq/L (ref 3.5–5.1)
Sodium: 138 mEq/L (ref 135–145)
Total Bilirubin: 0.5 mg/dL (ref 0.2–1.2)
Total Protein: 7 g/dL (ref 6.0–8.3)

## 2021-01-08 LAB — HEMOGLOBIN A1C: Hgb A1c MFr Bld: 6.2 % (ref 4.6–6.5)

## 2021-01-08 NOTE — Progress Notes (Signed)
[facility] [address] [place] [address] Dept: [phone] Dept Fax: [phone]  Transfer of Care Office Visit  Subjective:    Patient ID: Gavin Hopkins, male    DOB: 1955-09-16, 65 y.o..   MRN: [id number]  Chief Complaint  Patient presents with   Establish Care    TOC-  3 month f/u DM/HTN/chol.  Has questions about his BP meds.      History of Present Illness:  Patient is in today to establish care. Gavin Hopkins is originally from [place]. He is divorced and has one son (73 yo). He works as a Chemical engineer for [employer] He has been a long-term smoker, currently smoking 2/3 ppd of cigarettes. He occasionally drinks alcohol, but much less than int he past. He denies any drug use.  Gavin Hopkins has a history of Type 2 diabetes, hyperlipidemia, and hypertension. He is on multiple medications for his BP (HCTZ, metoprolol, verapamil, and valsartan). He takes metformin for his diabetes and is on lovastatin for his cholesterol. Gavin Hopkins has a history of some past tachycardia. He is either on metoprolol or the verapamil (possibly both) for rate control. He denies any orthostasis or generalized weakness.  Gavin Hopkins has a history of chronic low back pain. He has been on long-standing therapy for this. He notes that he was previously on the Norco 7.5/325, but Dr. Ethelene Hal worked with him to lower to the 5/325 dose. He only takes this medication once a day.    Gavin Hopkins has had a prior total right hip joint replacement. He notes he has occasional left hip pains. He does some considerable walking on hard surfaces each day at work, which seems to exacerbate this.  Gavin Hopkins has a history of peripheral neuropathy involving the tips of all of his fingers. These tingle and at times feel swollen, even though they are not. He uses gabapentin for management of this.  Gavin Hopkins has a past history of a fatty  liver. This may have been due to past alcohol abuse or to a combination of obesity and diabetes.  Past Medical History: Patient Active Problem List   Diagnosis Date Noted   Tobacco abuse 02/14/2020   Hyperlipidemia associated with type 2 diabetes mellitus ([place]) 02/14/2020   Chronic midline low back pain without sciatica 10/12/2019   Osteoarthritis 10/12/2019   Hepatic steatosis 08/19/2019   Alcohol abuse 08/19/2019   Essential hypertension 06/28/2019   Type 2 diabetes mellitus with stage 3a chronic kidney disease, without long-term current use of insulin ([place]) 06/28/2019   Atrial tachycardia ([place]) 06/28/2019   Chronic prescription opiate use 06/28/2019   Neuropathy of both upper extremities 06/28/2019   History of revision of total replacement of right hip joint 06/22/2019   Primary osteoarthritis of left hip 06/07/2019   Past Surgical History:  Procedure Laterality Date   FRACTURE SURGERY     LEG SURGERY Right    over 30 yrs ago   NO PAST SURGERIES     TONSILLECTOMY     TOTAL HIP ARTHROPLASTY Right 06/07/2019   Procedure: TOTAL HIP ARTHROPLASTY ANTERIOR APPROACH;  Surgeon: Gaynelle Arabian, MD;  Location: [facility];  Service: Orthopedics;  Laterality: Right;  168min   Family History  Problem Relation Age of Onset   Hypertension Mother    Hyperlipidemia Mother    Diabetes Mother    Heart attack Mother        stent   Stroke Mother    Heart  attack Brother    Diabetes Brother    Cancer Maternal Uncle        Pancreatic   Heart attack Maternal Grandmother    Heart attack Maternal Grandfather    Outpatient Medications Prior to Visit  Medication Sig Dispense Refill   Aromatic Inhalants (VICKS BABYRUB) OINT Apply 1 application topically at bedtime.      aspirin EC 81 MG tablet Take 81 mg by mouth daily. Swallow whole.     gabapentin (NEURONTIN) 300 MG capsule TAKE 1 CAPSULE BY MOUTH THREE TIMES A DAY 270 capsule 1   hydrochlorothiazide (HYDRODIURIL) 12.5 MG tablet TAKE 1 TABLET BY  MOUTH EVERY DAY 90 tablet 1   HYDROcodone-acetaminophen (NORCO) 5-325 MG tablet May take one daily prior to work 90 tablet 0   lovastatin (MEVACOR) 40 MG tablet TAKE 1 TABLET BY MOUTH EVERYDAY AT BEDTIME 90 tablet 0   metFORMIN (GLUCOPHAGE) 500 MG tablet TAKE 2 TABLETS (1,000 MG TOTAL) BY MOUTH DAILY IN THE AFTERNOON. 180 tablet 0   metoprolol succinate (TOPROL-XL) 100 MG 24 hr tablet TAKE 1 TABLET BY MOUTH EVERY DAY 90 tablet 1   Multiple Vitamin (MULTIVITAMIN WITH MINERALS) TABS tablet Take 1 tablet by mouth daily. Centrum Silver for Men 50+     valsartan (DIOVAN) 320 MG tablet TAKE 1 TABLET BY MOUTH EVERY DAY 90 tablet 0   verapamil (CALAN-SR) 240 MG CR tablet TAKE 1 TABLET BY MOUTH EVERYDAY AT BEDTIME 90 tablet 1   No facility-administered medications prior to visit.   No Known Allergies    Objective:   Today's Vitals   01/08/21 1308  BP: 120/72  Pulse: (!) 54  Temp: (!) 97.2 F (36.2 C)  TempSrc: Temporal  SpO2: 96%  Weight: 208 lb 12.8 oz (94.7 kg)  Height: 5\' 8"  (1.727 m)   Body mass index is 31.75 kg/m.   General: Well developed, well nourished. No acute distress. Feet: Skin intact. DP and PT pulses +. 5.07 mm monofilament testing normal. Psych: Alert and oriented x3. Normal mood and affect.  Health Maintenance Due  Topic Date Due   COVID-19 Vaccine (1) Never done   Zoster Vaccines- Shingrix (1 of 2) Never done   PNA vac Low Risk Adult (2 of 2 - PCV13) 06/27/2020   OPHTHALMOLOGY EXAM  07/18/2020     Lab results: Lab Results  Component Value Date   HGBA1C 6.5 10/10/2020   Lab Results  Component Value Date   CHOL 149 10/10/2020   HDL 44.10 10/10/2020   LDLCALC 67 04/19/2020   LDLDIRECT 89.0 10/10/2020   TRIG 209.0 (H) 10/10/2020   CHOLHDL 3 10/10/2020   Assessment & Plan:   1. Type 2 diabetes mellitus with stage 3a chronic kidney disease, without long-term current use of insulin (HCC) Diabetes has been in good control. He is due for quarterly labs.  Continue metformin.  - Glucose, random - Hemoglobin A1c  2. Hyperlipidemia associated with type 2 diabetes mellitus ([place]) In good control on lovastatin.  3. Essential hypertension Blood pressure at goal on 4-drug therapy.  4. Atrial tachycardia ([place]) Rate is well controlled. Patient plans to follow-up with cardiology.  5. Hepatic steatosis I will check LFTs to reassess.  - Comprehensive metabolic panel  6. Neuropathy of both upper extremities Stable on gabapentin 300 mg TID.  7. Tobacco abuse Discussed smoking cessation. I strongly urged him to quit. We discussed him setting a rule for himself to not smoke in the house, to see if this will allow  him to cut down on his smoking.  8. Chronic midline low back pain without sciatica Stable. Takes 1 Norco a day.  9. Need for pneumococcal vaccination  - Pneumococcal conjugate vaccine 13-valent  Haydee Salter, MD

## 2021-01-12 NOTE — Telephone Encounter (Signed)
Please see refill request.

## 2021-01-21 ENCOUNTER — Other Ambulatory Visit: Payer: Self-pay | Admitting: Cardiology

## 2021-01-22 ENCOUNTER — Encounter: Payer: Self-pay | Admitting: Cardiology

## 2021-01-22 ENCOUNTER — Ambulatory Visit: Payer: No Typology Code available for payment source | Admitting: Cardiology

## 2021-01-22 ENCOUNTER — Other Ambulatory Visit: Payer: Self-pay

## 2021-01-22 VITALS — BP 131/87 | HR 96 | Temp 98.5°F | Resp 16 | Ht 68.0 in | Wt 209.0 lb

## 2021-01-22 DIAGNOSIS — E782 Mixed hyperlipidemia: Secondary | ICD-10-CM

## 2021-01-22 DIAGNOSIS — E1165 Type 2 diabetes mellitus with hyperglycemia: Secondary | ICD-10-CM

## 2021-01-22 DIAGNOSIS — I10 Essential (primary) hypertension: Secondary | ICD-10-CM

## 2021-01-22 DIAGNOSIS — Z720 Tobacco use: Secondary | ICD-10-CM

## 2021-01-22 DIAGNOSIS — I4510 Unspecified right bundle-branch block: Secondary | ICD-10-CM

## 2021-01-22 DIAGNOSIS — E6609 Other obesity due to excess calories: Secondary | ICD-10-CM

## 2021-01-22 DIAGNOSIS — I493 Ventricular premature depolarization: Secondary | ICD-10-CM

## 2021-01-22 NOTE — Progress Notes (Signed)
Gavin Hopkins Date of Birth: [birth year] MRN: [id number] Primary Care Provider:Rudd, Lillette Boxer, MD Former Cardiology Providers: Jeri Lager, APRN, FNP-C Primary Cardiologist: Rex Kras, DO, [facility] (established care 01/27/2020)  Date: 01/22/21 Last Office Visit: 07/31/2020  Chief Complaint  Patient presents with   Ectopic atrial tachycardia    Follow-up    HPI  Gavin Hopkins is a 65 y.o.  male who presents to the office with a chief complaint of "6 month follow up." Patient's past medical history and cardiovascular risk factors include: hypertension, type 2 diabetes, OSA not on CPAP due to intolerance, current smoker, hyperlipidemia, atrial tachycardia, RBBB.   Initially referred to the office back in 2020 for evaluation of tachycardia.  Since then he has been on AV nodal blocking agents and doing well.  He continues to have PVCs on his surface EKG despite being on both metoprolol and diltiazem.  Majority of the PVCs are in a trigeminal pattern.  Symptomatically he has generalized tired and fatigue.  He denies any near-syncope or syncopal events.  He denies any chest pain or shortness of breath at rest or with effort related activities.  Unfortunately, he continues to smoke 0.75 packs/day.  But has been in discussion with PCP with regards to quitting smoking.  FUNCTIONAL STATUS: Continues to work full time at [employer]. He is on his feet throughout the day. No structured exercise program or daily routine.    ALLERGIES: No Known Allergies   MEDICATION LIST PRIOR TO VISIT: Current Outpatient Medications on File Prior to Visit  Medication Sig Dispense Refill   Aromatic Inhalants (VICKS BABYRUB) OINT Apply 1 application topically at bedtime.      aspirin EC 81 MG tablet Take 81 mg by mouth daily. Swallow whole.     gabapentin (NEURONTIN) 300 MG capsule TAKE 1 CAPSULE BY MOUTH THREE TIMES A DAY 270 capsule 1   hydrochlorothiazide (HYDRODIURIL) 12.5 MG tablet TAKE 1 TABLET  BY MOUTH EVERY DAY 90 tablet 1   HYDROcodone-acetaminophen (NORCO) 5-325 MG tablet May take one daily prior to work 90 tablet 0   lovastatin (MEVACOR) 40 MG tablet TAKE 1 TABLET BY MOUTH EVERYDAY AT BEDTIME 90 tablet 0   metFORMIN (GLUCOPHAGE) 500 MG tablet TAKE 2 TABLETS (1,000 MG TOTAL) BY MOUTH DAILY IN THE AFTERNOON. 180 tablet 1   metoprolol succinate (TOPROL-XL) 100 MG 24 hr tablet TAKE 1 TABLET BY MOUTH EVERY DAY 90 tablet 1   Multiple Vitamin (MULTIVITAMIN WITH MINERALS) TABS tablet Take 1 tablet by mouth daily. Centrum Silver for Men 50+     valsartan (DIOVAN) 320 MG tablet TAKE 1 TABLET BY MOUTH EVERY DAY 90 tablet 0   verapamil (CALAN-SR) 240 MG CR tablet TAKE 1 TABLET BY MOUTH EVERYDAY AT BEDTIME 90 tablet 1   No current facility-administered medications on file prior to visit.    PAST MEDICAL HISTORY: Past Medical History:  Diagnosis Date   Arthritis    oa   Diabetes mellitus without complication ([place])    type 2   Displaced fracture of lateral malleolus of right fibula, initial encounter for closed fracture 30 yrs ago   and tibula, full cast applied   Hypertension    Sleep apnea    could not tolerate cpap    PAST SURGICAL HISTORY: Past Surgical History:  Procedure Laterality Date   FRACTURE SURGERY     LEG SURGERY Right    over 30 yrs ago   NO PAST SURGERIES     TONSILLECTOMY  TOTAL HIP ARTHROPLASTY Right 06/07/2019   Procedure: TOTAL HIP ARTHROPLASTY ANTERIOR APPROACH;  Surgeon: Gaynelle Arabian, MD;  Location: [facility];  Service: Orthopedics;  Laterality: Right;  125min    FAMILY HISTORY: The patient's family history includes Cancer in his maternal uncle; Diabetes in his brother and mother; Heart attack in his brother, maternal grandfather, maternal grandmother, and mother; Hyperlipidemia in his mother; Hypertension in his mother; Stroke in his mother.   SOCIAL HISTORY:  The patient  reports that he has been smoking cigarettes. He has a 11.25 pack-year smoking  history. He has never used smokeless tobacco. He reports previous alcohol use. He reports that he does not use drugs.  Review of Systems  Constitutional: Positive for malaise/fatigue. Negative for chills and fever.  HENT:  Negative for hoarse voice and nosebleeds.   Eyes:  Negative for discharge, double vision and pain.  Cardiovascular:  Negative for chest pain, claudication, dyspnea on exertion, leg swelling, near-syncope, orthopnea, palpitations, paroxysmal nocturnal dyspnea and syncope.  Respiratory:  Positive for snoring. Negative for hemoptysis and shortness of breath.   Musculoskeletal:  Negative for muscle cramps and myalgias.  Gastrointestinal:  Negative for abdominal pain, constipation, diarrhea, hematemesis, hematochezia, melena, nausea and vomiting.  Neurological:  Negative for dizziness and light-headedness.   PHYSICAL EXAM: Vitals with BMI 01/22/2021 01/08/2021 10/06/2020  Height 5\' 8"  5\' 8"  5\' 8"   Weight 209 lbs 208 lbs 13 oz 212 lbs 3 oz  BMI 31.79 64.33 29.51  Systolic 884 166 063  Diastolic 87 72 80  Pulse 96 54 88    CONSTITUTIONAL: Well-developed and well-nourished. No acute distress.  SKIN: Skin is warm and dry. No rash noted. No cyanosis. No pallor. No jaundice HEAD: Normocephalic and atraumatic.  EYES: No scleral icterus MOUTH/THROAT: Moist oral membranes.  NECK: No JVD present. No thyromegaly noted. No carotid bruits  LYMPHATIC: No visible cervical adenopathy.  CHEST Normal respiratory effort. No intercostal retractions  LUNGS: Clear to auscultation bilaterally.  No stridor. No wheezes. No rales.  CARDIOVASCULAR: Regular rate and rhythm, positive S1-S2, no murmurs rubs or gallops appreciated ABDOMINAL: Obese, soft, nontender, nondistended, positive bowel sounds in all 4 quadrants.  No apparent ascites.  EXTREMITIES: No peripheral edema  HEMATOLOGIC: No significant bruising NEUROLOGIC: Oriented to person, place, and time. Nonfocal. Normal muscle tone.   PSYCHIATRIC: Normal mood and affect. Normal behavior. Cooperative  CARDIAC DATABASE: EKG: 01/22/2021: Normal sinus rhythm, right bundle branch block, left axis, frequent PVCs in ventricular trigeminy pattern.  Echocardiogram: 05/06/2019: LVEF 01%, grade 1 diastolic impairment, normal LAP, normal left atrial size, lipomatous hypertrophy of the intra-atrial septum, anterior fat pad, aortic root 3.9 cm.  Stress Testing:  Lexiscan Myoview Stress Test 05/10/2019: 1. Resting EKG normal sinus rhythm, left axis deviation, left anterior fascicular block, right bundle branch block.  Low-voltage complexes.  Stress EKG nondiagnostic due to Lexiscan infusion. 2. Nuclear perfusion reveals diaphragmatic attenuation in the inferior wall.  There is no demonstrable ischemia or infarct.  LVEF was 58% without wall motion abnormality.  This is a low risk stress test.  Heart Catheterization: None  Carotid duplex: 02/10/2020:  No hemodynamically significant arterial disease in the internal carotid artery bilaterally. Mild increase in velocity in bilateral external carotid arteries without plaque noted. May indicate <50% stenosis.  There is minimal plaque in carotid arteries.  Right vertebral artery flow is not visualized. Antegrade left vertebral artery flow.  Follow up studies if clinically indicated.   LABORATORY DATA: CBC Latest Ref Rng & Units 04/19/2020 06/08/2019  06/02/2019  WBC 4.0 - 10.5 K/uL 10.5 17.0(H) 12.7(H)  Hemoglobin 13.0 - 17.0 g/dL 16.6 13.1 15.1  Hematocrit 39.0 - 52.0 % 49.4 39.7 45.7  Platelets 150.0 - 400.0 K/uL 282.0 230 254    CMP Latest Ref Rng & Units 01/08/2021 04/19/2020 09/02/2019  Glucose 70 - 99 mg/dL 91 99 -  BUN 6 - 23 mg/dL 20 21 -  Creatinine 0.40 - 1.50 mg/dL 1.29 1.35 -  Sodium 135 - 145 mEq/L 138 136 -  Potassium 3.5 - 5.1 mEq/L 4.6 4.6 -  Chloride 96 - 112 mEq/L 102 101 -  CO2 19 - 32 mEq/L 29 27 -  Calcium 8.4 - 10.5 mg/dL 10.0 9.8 -  Total Protein 6.0 - 8.3 g/dL  7.0 7.3 7.0  Total Bilirubin 0.2 - 1.2 mg/dL 0.5 0.4 0.3  Alkaline Phos 39 - 117 U/L 66 75 70  AST 0 - 37 U/L 23 22 20   ALT 0 - 53 U/L 30 23 22     Lipid Panel     Component Value Date/Time   CHOL 149 10/10/2020 0855   TRIG 209.0 (H) 10/10/2020 0855   HDL 44.10 10/10/2020 0855   CHOLHDL 3 10/10/2020 0855   VLDL 41.8 (H) 10/10/2020 0855   LDLCALC 67 04/19/2020 0824   LDLDIRECT 89.0 10/10/2020 0855    Lab Results  Component Value Date   HGBA1C 6.2 01/08/2021   HGBA1C 6.5 10/10/2020   HGBA1C 6.4 04/19/2020   No components found for: NTPROBNP No results found for: TSH  Cardiac Panel (last 3 results) No results for input(s): CKTOTAL, CKMB, TROPONINIHS, RELINDX in the last 72 hours.  IMPRESSION:    ICD-10-CM   1. Premature ventricular contraction  I49.3 EKG 12-Lead    PCV ECHOCARDIOGRAM COMPLETE    2. RBBB  I45.10     3. Benign essential hypertension  I10 PCV ECHOCARDIOGRAM COMPLETE    4. Tobacco use  Z72.0     5. Mixed hyperlipidemia  E78.2 CT CARDIAC SCORING ([facility] LOCATIONS ONLY)    6. Type 2 diabetes mellitus with hyperglycemia, with long-term current use of insulin (HCC)  E11.65 CT CARDIAC SCORING ([facility] LOCATIONS ONLY)   Z79.4     7. Class 1 obesity due to excess calories with serious comorbidity and body mass index (BMI) of 31.0 to 31.9 in adult  E66.09    Z68.31        RECOMMENDATIONS: Gavin Hopkins is a 65 y.o. male whose past medical history and cardiovascular risk factors include:  hypertension, type 2 diabetes, OSA not on CPAP due to intolerance, current smoker, hyperlipidemia, atrial tachycardia, RBBB.   Premature ventricular contractions: Surface ECG shows normal sinus rhythm with frequent PVCs in a trigeminal pattern. Currently on both beta-blockers and calcium channel blockers. Last echocardiogram was in 2020.  We will repeat echo to evaluate LVEF. Discussed considering antiarrhythmic medications to better control the burden of PVCs.   However, he  would like to discuss this further with his son prior to making his decision.   We also considered EP consultation for possible PVC ablation since he is having generalized tired and fatigue and continues to have PVCs despite being on AV nodal blocking agents.   Depending on if he would like to proceed antiarrhythmic medications versus ablation will decide if he we will get a repeat Holter monitor at our office or with EP.   Check coronary calcium score to evaluate for subclinical CAD.  Benign essential hypertension: Currently his blood pressure acceptable range.  Continue current medical therapy.  Medications reconciled. Educated importance of a low-salt diet. Patient is informed to call the office if his systolic blood pressures are consistently greater than 140 mmHg.  Cigarette smoking: Tobacco cessation counseling: Currently smoking 0.75 packs/day   Patient was informed of the dangers of tobacco abuse including stroke, cancer, and MI, as well as benefits of tobacco cessation. Patient is willing to quit at this time. Approximately 5 mins were spent counseling patient cessation techniques. We discussed various methods to help quit smoking, including deciding on a date to quit, joining a support group, pharmacological agents- nicotine gum/patch/lozenges, chantix.  I will reassess his progress at the next follow-up visit  None insulin-dependent diabetes mellitus type 2:  Most recent labs reviewed. Currently managed by primary care provider.  FINAL MEDICATION LIST END OF ENCOUNTER: No orders of the defined types were placed in this encounter.   There are no discontinued medications.   Current Outpatient Medications:    Aromatic Inhalants (VICKS BABYRUB) OINT, Apply 1 application topically at bedtime. , Disp: , Rfl:    aspirin EC 81 MG tablet, Take 81 mg by mouth daily. Swallow whole., Disp: , Rfl:    gabapentin (NEURONTIN) 300 MG capsule, TAKE 1 CAPSULE BY MOUTH THREE TIMES A DAY, Disp:  270 capsule, Rfl: 1   hydrochlorothiazide (HYDRODIURIL) 12.5 MG tablet, TAKE 1 TABLET BY MOUTH EVERY DAY, Disp: 90 tablet, Rfl: 1   HYDROcodone-acetaminophen (NORCO) 5-325 MG tablet, May take one daily prior to work, Disp: 90 tablet, Rfl: 0   lovastatin (MEVACOR) 40 MG tablet, TAKE 1 TABLET BY MOUTH EVERYDAY AT BEDTIME, Disp: 90 tablet, Rfl: 0   metFORMIN (GLUCOPHAGE) 500 MG tablet, TAKE 2 TABLETS (1,000 MG TOTAL) BY MOUTH DAILY IN THE AFTERNOON., Disp: 180 tablet, Rfl: 1   metoprolol succinate (TOPROL-XL) 100 MG 24 hr tablet, TAKE 1 TABLET BY MOUTH EVERY DAY, Disp: 90 tablet, Rfl: 1   Multiple Vitamin (MULTIVITAMIN WITH MINERALS) TABS tablet, Take 1 tablet by mouth daily. Centrum Silver for Men 50+, Disp: , Rfl:    valsartan (DIOVAN) 320 MG tablet, TAKE 1 TABLET BY MOUTH EVERY DAY, Disp: 90 tablet, Rfl: 0   verapamil (CALAN-SR) 240 MG CR tablet, TAKE 1 TABLET BY MOUTH EVERYDAY AT BEDTIME, Disp: 90 tablet, Rfl: 1  Orders Placed This Encounter  Procedures   CT CARDIAC SCORING ([facility] LOCATIONS ONLY)   EKG 12-Lead   PCV ECHOCARDIOGRAM COMPLETE   --Continue cardiac medications as reconciled in final medication list. --Return in about 3 months (around 04/24/2021) for Follow up PVCs, patient to consider EP evalution for PVC abalation. . Or sooner if needed. --Continue follow-up with your primary care physician regarding the management of your other chronic comorbid conditions.  Patient's questions and concerns were addressed to his satisfaction. He voices understanding of the instructions provided during this encounter.   Time spent: 32 minutes.  This note was created using a voice recognition software as a result there may be grammatical errors inadvertently enclosed that do not reflect the nature of this encounter. Every attempt is made to correct such errors.  Rex Kras, [place], [facility]  Pager: [phone] Office: [phone]

## 2021-01-30 ENCOUNTER — Ambulatory Visit: Payer: No Typology Code available for payment source | Admitting: Cardiology

## 2021-02-06 ENCOUNTER — Other Ambulatory Visit: Payer: Self-pay

## 2021-02-06 ENCOUNTER — Telehealth: Payer: Self-pay | Admitting: Family Medicine

## 2021-02-06 ENCOUNTER — Ambulatory Visit: Payer: No Typology Code available for payment source

## 2021-02-06 DIAGNOSIS — M1611 Unilateral primary osteoarthritis, right hip: Secondary | ICD-10-CM

## 2021-02-06 DIAGNOSIS — G8929 Other chronic pain: Secondary | ICD-10-CM

## 2021-02-06 DIAGNOSIS — I471 Supraventricular tachycardia: Secondary | ICD-10-CM

## 2021-02-06 DIAGNOSIS — M545 Low back pain, unspecified: Secondary | ICD-10-CM

## 2021-02-06 DIAGNOSIS — I10 Essential (primary) hypertension: Secondary | ICD-10-CM

## 2021-02-06 DIAGNOSIS — I493 Ventricular premature depolarization: Secondary | ICD-10-CM

## 2021-02-06 MED ORDER — HYDROCODONE-ACETAMINOPHEN 5-325 MG PO TABS: ORAL_TABLET | ORAL | 0 refills | Status: DC

## 2021-02-06 NOTE — Telephone Encounter (Signed)
Pt is wanting a refill on his HYDROcodone-acetaminophen (NORCO) 5-325 MG tablet [[id number]]  Pharmacy  [id number] - [place], [place] - [address]  [address], [place] [address]  Phone:  [phone]  Fax:  [phone]  DEA #:  [id number]   He is also wanting a referral to a cardiologist Dr. Alba Cory. Please advise pt

## 2021-02-06 NOTE — Telephone Encounter (Signed)
Refill request for:  Hydrocodone/Acet 5/325mg  LR 11/06/20, #90,0 rf LOV 01/08/21 FOV 04/11/21  Also wants a referral to cardiologist, Dr Alba Cory.   Please review and advise  Thanks. Dm/cma

## 2021-02-07 NOTE — Telephone Encounter (Signed)
Patient notified VIA phone.  No questions.  Dm/cma ? ?

## 2021-02-12 ENCOUNTER — Other Ambulatory Visit: Payer: No Typology Code available for payment source

## 2021-02-23 NOTE — Progress Notes (Signed)
Spoke to patient he is aware of results and was reminded about upcoming ov.

## 2021-03-02 ENCOUNTER — Other Ambulatory Visit: Payer: Self-pay | Admitting: Family Medicine

## 2021-03-02 DIAGNOSIS — G8929 Other chronic pain: Secondary | ICD-10-CM

## 2021-03-02 DIAGNOSIS — G5693 Unspecified mononeuropathy of bilateral upper limbs: Secondary | ICD-10-CM

## 2021-03-08 ENCOUNTER — Telehealth: Payer: Self-pay | Admitting: Family Medicine

## 2021-03-08 NOTE — Telephone Encounter (Signed)
Pt needs Lovastatin Rx refilled

## 2021-03-09 MED ORDER — LOVASTATIN 40 MG PO TABS: ORAL_TABLET | ORAL | 0 refills | Status: DC

## 2021-03-09 NOTE — Telephone Encounter (Signed)
Lft VM  that RX was sent to the pharmacy. Dm/cma  

## 2021-03-13 ENCOUNTER — Ambulatory Visit: Payer: No Typology Code available for payment source | Admitting: Family Medicine

## 2021-03-13 ENCOUNTER — Other Ambulatory Visit: Payer: Self-pay

## 2021-03-13 ENCOUNTER — Encounter: Payer: Self-pay | Admitting: Family Medicine

## 2021-03-13 VITALS — BP 126/78 | HR 78 | Temp 97.2°F | Ht 68.0 in | Wt 212.4 lb

## 2021-03-13 DIAGNOSIS — M545 Low back pain, unspecified: Secondary | ICD-10-CM

## 2021-03-13 NOTE — Patient Instructions (Signed)
Back Exercises The following exercises strengthen the muscles that help to support the trunk and back. They also help to keep the lower back flexible. Doing these exercises can help to prevent back pain or lessen existing pain. If you have back pain or discomfort, try doing these exercises 2-3 times each day or as told by your health care provider. As your pain improves, do them once each day, but increase the number of times that you repeat the steps for each exercise (do more repetitions). To prevent the recurrence of back pain, continue to do these exercises once each day or as told by your health care provider. Do exercises exactly as told by your health care provider and adjust them as directed. It is normal to feel mild stretching, pulling, tightness, or discomfort as you do these exercises, but you should stop right away if youfeel sudden pain or your pain gets worse. Exercises Single knee to chest Repeat these steps 3-5 times for each leg: Lie on your back on a firm bed or the floor with your legs extended. Bring one knee to your chest. Your other leg should stay extended and in contact with the floor. Hold your knee in place by grabbing your knee or thigh with both hands and hold. Pull on your knee until you feel a gentle stretch in your lower back or buttocks. Hold the stretch for 10-30 seconds. Slowly release and straighten your leg. Pelvic tilt Repeat these steps 5-10 times: Lie on your back on a firm bed or the floor with your legs extended. Bend your knees so they are pointing toward the ceiling and your feet are flat on the floor. Tighten your lower abdominal muscles to press your lower back against the floor. This motion will tilt your pelvis so your tailbone points up toward the ceiling instead of pointing to your feet or the floor. With gentle tension and even breathing, hold this position for 5-10 seconds. Cat-cow Repeat these steps until your lower back becomes more  flexible: Get into a hands-and-knees position on a firm surface. Keep your hands under your shoulders, and keep your knees under your hips. You may place padding under your knees for comfort. Let your head hang down toward your chest. Contract your abdominal muscles and point your tailbone toward the floor so your lower back becomes rounded like the back of a cat. Hold this position for 5 seconds. Slowly lift your head, let your abdominal muscles relax and point your tailbone up toward the ceiling so your back forms a sagging arch like the back of a cow. Hold this position for 5 seconds.  Press-ups Repeat these steps 5-10 times: Lie on your abdomen (face-down) on the floor. Place your palms near your head, about shoulder-width apart. Keeping your back as relaxed as possible and keeping your hips on the floor, slowly straighten your arms to raise the top half of your body and lift your shoulders. Do not use your back muscles to raise your upper torso. You may adjust the placement of your hands to make yourself more comfortable. Hold this position for 5 seconds while you keep your back relaxed. Slowly return to lying flat on the floor.  Bridges Repeat these steps 10 times: Lie on your back on a firm surface. Bend your knees so they are pointing toward the ceiling and your feet are flat on the floor. Your arms should be flat at your sides, next to your body. Tighten your buttocks muscles and lift your   buttocks off the floor until your waist is at almost the same height as your knees. You should feel the muscles working in your buttocks and the back of your thighs. If you do not feel these muscles, slide your feet 1-2 inches farther away from your buttocks. Hold this position for 3-5 seconds. Slowly lower your hips to the starting position, and allow your buttocks muscles to relax completely. If this exercise is too easy, try doing it with your arms crossed over yourchest. Abdominal  crunches Repeat these steps 5-10 times: Lie on your back on a firm bed or the floor with your legs extended. Bend your knees so they are pointing toward the ceiling and your feet are flat on the floor. Cross your arms over your chest. Tip your chin slightly toward your chest without bending your neck. Tighten your abdominal muscles and slowly raise your trunk (torso) high enough to lift your shoulder blades a tiny bit off the floor. Avoid raising your torso higher than that because it can put too much stress on your low back and does not help to strengthen your abdominal muscles. Slowly return to your starting position. Back lifts Repeat these steps 5-10 times: Lie on your abdomen (face-down) with your arms at your sides, and rest your forehead on the floor. Tighten the muscles in your legs and your buttocks. Slowly lift your chest off the floor while you keep your hips pressed to the floor. Keep the back of your head in line with the curve in your back. Your eyes should be looking at the floor. Hold this position for 3-5 seconds. Slowly return to your starting position. Contact a health care provider if: Your back pain or discomfort gets much worse when you do an exercise. Your worsening back pain or discomfort does not lessen within 2 hours after you exercise. If you have any of these problems, stop doing these exercises right away. Do not do them again unless your health care provider says that you can. Get help right away if: You develop sudden, severe back pain. If this happens, stop doing the exercises right away. Do not do them again unless your health care provider says that you can. This information is not intended to replace advice given to you by your health care provider. Make sure you discuss any questions you have with your healthcare provider. Document Revised: 11/19/2018 Document Reviewed: 04/16/2018 Elsevier Patient Education  [place].

## 2021-03-13 NOTE — Progress Notes (Signed)
[facility] [address] [place] [address] Dept: [phone] Dept Fax: [phone]  Office Visit  Subjective:    Patient ID: Gavin Hopkins, male    DOB: October 26, 1955, 65 y.o..   MRN: [id number]  Chief Complaint  Patient presents with   Muscle Pain    Pt c/o pulled muscle in lower back with pain ranging 7-8 x3 days.    History of Present Illness:  Patient is in today for evaluation of left lower back pain. He notes he injured this on Sat. He was helping his son work on a Therapist, music that had broken down. His son was lifting the front end of the mower. Gavin Hopkins pulled on the mower deck with his left hand and had sudden onset of left lower back pain. He notes that he knew immediately that he had injured his back. He felt pretty good the rest of that day, but awakened with significant pain Sunday morning. He notes the pain is not present at rest or when lying down. It is I exacerbated as he goes from sitting to standing. He denies any new areas of numbness, weakness, or tingling in the lower extremities. He iced it for the first two days. He has taken hot showers and did use a heating bad one night. He has taken Tylenol. He is on COT with Norco 5/325 qam for left hip OA. He is also on gabapentin for chronic low back pain with sciatica.  Past Medical History: Patient Active Problem List   Diagnosis Date Noted   Obesity (BMI 30.0-34.9) 01/08/2021   Tobacco abuse 02/14/2020   Hyperlipidemia associated with type 2 diabetes mellitus ([place]) 02/14/2020   Chronic midline low back pain without sciatica 10/12/2019   Osteoarthritis 10/12/2019   Hepatic steatosis 08/19/2019   Alcohol abuse 08/19/2019   Essential hypertension 06/28/2019   Type 2 diabetes mellitus with stage 3a chronic kidney disease, without long-term current use of insulin ([place]) 06/28/2019   Atrial tachycardia ([place]) 06/28/2019   Chronic prescription opiate use 06/28/2019    Neuropathy of both upper extremities 06/28/2019   History of revision of total replacement of right hip joint 06/22/2019   Primary osteoarthritis of left hip 06/07/2019   Past Surgical History:  Procedure Laterality Date   FRACTURE SURGERY     LEG SURGERY Right    over 30 yrs ago   NO PAST SURGERIES     TONSILLECTOMY     TOTAL HIP ARTHROPLASTY Right 06/07/2019   Procedure: TOTAL HIP ARTHROPLASTY ANTERIOR APPROACH;  Surgeon: Gavin Arabian, MD;  Location: [facility];  Service: Orthopedics;  Laterality: Right;  187mn   Family History  Problem Relation Age of Onset   Hypertension Mother    Hyperlipidemia Mother    Diabetes Mother    Heart attack Mother        stent   Stroke Mother    Heart attack Brother    Diabetes Brother    Cancer Maternal Uncle        Pancreatic   Heart attack Maternal Grandmother    Heart attack Maternal Grandfather    Outpatient Medications Prior to Visit  Medication Sig Dispense Refill   Aromatic Inhalants (VICKS BABYRUB) OINT Apply 1 application topically at bedtime.      aspirin EC 81 MG tablet Take 81 mg by mouth daily. Swallow whole.     gabapentin (NEURONTIN) 300 MG capsule TAKE 1 CAPSULE BY MOUTH THREE TIMES A DAY 270 capsule 1   hydrochlorothiazide (  HYDRODIURIL) 12.5 MG tablet TAKE 1 TABLET BY MOUTH EVERY DAY 90 tablet 1   HYDROcodone-acetaminophen (NORCO) 5-325 MG tablet May take one daily prior to work 90 tablet 0   lovastatin (MEVACOR) 40 MG tablet TAKE 1 TABLET BY MOUTH EVERYDAY AT BEDTIME 90 tablet 0   metFORMIN (GLUCOPHAGE) 500 MG tablet TAKE 2 TABLETS (1,000 MG TOTAL) BY MOUTH DAILY IN THE AFTERNOON. 180 tablet 1   metoprolol succinate (TOPROL-XL) 100 MG 24 hr tablet TAKE 1 TABLET BY MOUTH EVERY DAY 90 tablet 1   Multiple Vitamin (MULTIVITAMIN WITH MINERALS) TABS tablet Take 1 tablet by mouth daily. Centrum Silver for Men 50+     valsartan (DIOVAN) 320 MG tablet TAKE 1 TABLET BY MOUTH EVERY DAY 90 tablet 0   verapamil (CALAN-SR) 240 MG CR  tablet TAKE 1 TABLET BY MOUTH EVERYDAY AT BEDTIME 90 tablet 1   No facility-administered medications prior to visit.   No Known Allergies    Objective:   Today's Vitals   03/13/21 1533  BP: 126/78  Pulse: 78  Temp: (!) 97.2 F (36.2 C)  TempSrc: Temporal  SpO2: 93%  Weight: 212 lb 6.4 oz (96.3 kg)  Height: '5\' 8"'$  (1.727 m)   Body mass index is 32.3 kg/m.   General: Well developed, well nourished. No acute distress. Back: Straight. Pain over paraspinal muscles int he left lower back around the iliac crest. Extremities: Full ROM. SLR -. Strength 5/5. DTR 2+ bilat. Psych: Alert and oriented x3. Normal mood and affect.  Health Maintenance Due  Topic Date Due   COVID-19 Vaccine (1) Never done   Zoster Vaccines- Shingrix (1 of 2) Never done   OPHTHALMOLOGY EXAM  07/18/2020   INFLUENZA VACCINE  02/26/2021    Assessment & Plan:   1. Acute left-sided low back pain without sciatica This appears to be primarily muscular low back pain. I recommended he use heat and stretches for his back. He can increase his Vicodin to 1 po twice a day for one week, and then resume his once a day dose. I provided him a note for work to remain off until Monday and the resume light duty for a week. If not improved in 2 weeks, he should follow-up with me.  Gavin Salter, MD

## 2021-03-14 ENCOUNTER — Ambulatory Visit: Payer: No Typology Code available for payment source | Admitting: Family Medicine

## 2021-03-20 ENCOUNTER — Encounter: Payer: Self-pay | Admitting: Cardiology

## 2021-04-10 ENCOUNTER — Other Ambulatory Visit: Payer: Self-pay | Admitting: Family Medicine

## 2021-04-10 ENCOUNTER — Other Ambulatory Visit: Payer: Self-pay

## 2021-04-10 DIAGNOSIS — I10 Essential (primary) hypertension: Secondary | ICD-10-CM

## 2021-04-11 ENCOUNTER — Encounter: Payer: Self-pay | Admitting: Family Medicine

## 2021-04-11 ENCOUNTER — Ambulatory Visit: Payer: No Typology Code available for payment source | Admitting: Family Medicine

## 2021-04-11 VITALS — BP 130/80 | HR 83 | Temp 96.0°F | Ht 68.0 in | Wt 212.2 lb

## 2021-04-11 DIAGNOSIS — E785 Hyperlipidemia, unspecified: Secondary | ICD-10-CM

## 2021-04-11 DIAGNOSIS — N1831 Chronic kidney disease, stage 3a: Secondary | ICD-10-CM

## 2021-04-11 DIAGNOSIS — I10 Essential (primary) hypertension: Secondary | ICD-10-CM

## 2021-04-11 DIAGNOSIS — G8929 Other chronic pain: Secondary | ICD-10-CM

## 2021-04-11 DIAGNOSIS — E1122 Type 2 diabetes mellitus with diabetic chronic kidney disease: Secondary | ICD-10-CM

## 2021-04-11 DIAGNOSIS — M545 Low back pain, unspecified: Secondary | ICD-10-CM

## 2021-04-11 DIAGNOSIS — E669 Obesity, unspecified: Secondary | ICD-10-CM

## 2021-04-11 DIAGNOSIS — E1169 Type 2 diabetes mellitus with other specified complication: Secondary | ICD-10-CM

## 2021-04-11 LAB — HEMOGLOBIN A1C: Hgb A1c MFr Bld: 6.3 % (ref 4.6–6.5)

## 2021-04-11 NOTE — Progress Notes (Signed)
[facility] [address] [place] [address] Dept: [phone] Dept Fax: [phone]  Chronic Care Office Visit  Subjective:    Patient ID: Lebrandon Huska, male    DOB: [birth year], 65 y.o..   MRN: [id number]  No chief complaint on file.   History of Present Illness:  Patient is in today for reassessment of chronic medical issues.  Mr. Rodkey has a history of Type 2 diabetes, hyperlipidemia, and hypertension. He is on multiple medications for his BP (HCTZ, metoprolol, verapamil, and valsartan). He takes metformin for his diabetes and is on lovastatin for his cholesterol. Mr. Bartels has a history of some past tachycardia. He is either on metoprolol or the verapamil (possibly both) for rate control. He denies any orthostasis or generalized weakness. He does want to lose weight noting he has been able to get this under 200 lbs in the past. He finds that eating more salads helps with this.   Mr. Packman has a history of chronic low back pain. He has been on long-standing therapy with Norco 5/325  mg once a day. I had seen him about a month ago with an acute low back pain issue. We had discussed increasing his Norco to twice a day temporarily. He notes he never went up on the dose and now is doing much better.   Past Medical History: Patient Active Problem List   Diagnosis Date Noted   Obesity (BMI 30.0-34.9) 01/08/2021   Tobacco abuse 02/14/2020   Hyperlipidemia associated with type 2 diabetes mellitus ([place]) 02/14/2020   Chronic midline low back pain without sciatica 10/12/2019   Osteoarthritis 10/12/2019   Hepatic steatosis 08/19/2019   Alcohol abuse 08/19/2019   Essential hypertension 06/28/2019   Type 2 diabetes mellitus with stage 3a chronic kidney disease, without long-term current use of insulin ([place]) 06/28/2019   Atrial tachycardia ([place]) 06/28/2019   Chronic prescription opiate use 06/28/2019   Neuropathy of  both upper extremities 06/28/2019   History of revision of total replacement of right hip joint 06/22/2019   Primary osteoarthritis of left hip 06/07/2019    Past Surgical History:  Procedure Laterality Date   FRACTURE SURGERY     LEG SURGERY Right    over 30 yrs ago   NO PAST SURGERIES     TONSILLECTOMY     TOTAL HIP ARTHROPLASTY Right 06/07/2019   Procedure: TOTAL HIP ARTHROPLASTY ANTERIOR APPROACH;  Surgeon: Gaynelle Arabian, MD;  Location: [facility];  Service: Orthopedics;  Laterality: Right;  149mn   Family History  Problem Relation Age of Onset   Hypertension Mother    Hyperlipidemia Mother    Diabetes Mother    Heart attack Mother        stent   Stroke Mother    Heart attack Brother    Diabetes Brother    Cancer Maternal Uncle        Pancreatic   Heart attack Maternal Grandmother    Heart attack Maternal Grandfather    Outpatient Medications Prior to Visit  Medication Sig Dispense Refill   Aromatic Inhalants (VICKS BABYRUB) OINT Apply 1 application topically at bedtime.      aspirin EC 81 MG tablet Take 81 mg by mouth daily. Swallow whole.     gabapentin (NEURONTIN) 300 MG capsule TAKE 1 CAPSULE BY MOUTH THREE TIMES A DAY 270 capsule 1   hydrochlorothiazide (HYDRODIURIL) 12.5 MG tablet TAKE 1 TABLET BY MOUTH EVERY DAY 90 tablet 1   HYDROcodone-acetaminophen (NORCO) 5-325 MG  tablet May take one daily prior to work 90 tablet 0   lovastatin (MEVACOR) 40 MG tablet TAKE 1 TABLET BY MOUTH EVERYDAY AT BEDTIME 90 tablet 0   metFORMIN (GLUCOPHAGE) 500 MG tablet TAKE 2 TABLETS (1,000 MG TOTAL) BY MOUTH DAILY IN THE AFTERNOON. 180 tablet 1   metoprolol succinate (TOPROL-XL) 100 MG 24 hr tablet TAKE 1 TABLET BY MOUTH EVERY DAY 90 tablet 1   Multiple Vitamin (MULTIVITAMIN WITH MINERALS) TABS tablet Take 1 tablet by mouth daily. Centrum Silver for Men 50+     valsartan (DIOVAN) 320 MG tablet TAKE 1 TABLET BY MOUTH EVERY DAY 90 tablet 3   verapamil (CALAN-SR) 240 MG CR tablet TAKE 1  TABLET BY MOUTH EVERYDAY AT BEDTIME 90 tablet 1   No facility-administered medications prior to visit.   No Known Allergies    Objective:   Today's Vitals   04/11/21 1254  BP: 130/80  Pulse: 83  Temp: (!) 96 F (35.6 C)  TempSrc: Temporal  SpO2: 93%  Weight: 212 lb 3.2 oz (96.3 kg)  Height: '5\' 8"'$  (1.727 m)   Body mass index is 32.26 kg/m.   General: Well developed, well nourished. No acute distress. Psych: Alert and oriented. Normal mood and affect.  Health Maintenance Due  Topic Date Due   Zoster Vaccines- Shingrix (1 of 2) Never done   OPHTHALMOLOGY EXAM  07/18/2020   Lab Results Lab Results  Component Value Date   CHOL 149 10/10/2020   HDL 44.10 10/10/2020   LDLCALC 67 04/19/2020   LDLDIRECT 89.0 10/10/2020   TRIG 209.0 (H) 10/10/2020   CHOLHDL 3 10/10/2020   Lab Results  Component Value Date   HGBA1C 6.2 01/08/2021     Assessment & Plan:   1. Type 2 diabetes mellitus with stage 3a chronic kidney disease, without long-term current use of insulin [facility]) Mr. Pucillo has been at goal with the use of metformin. He is due for a repeat A1c. Up to date on other routine screening.  - Hemoglobin A1c  2. Essential hypertension Blood pressure is tolerable on 4 drug therapy. We will continue this for now. He will be seeing Dr. Percival Spanish in the near future.  3. Hyperlipidemia associated with type 2 diabetes mellitus ([place]) Last lipids were at goal. We will plan to recheck his fasting lipids at his next visit in 3 months.  4. Chronic midline low back pain without sciatica Stable. Plan to continue low level Norco use.  5. Obesity (BMI 30.0-34.9) We discussed various dietary approaches to losing weight. I recommended substituting vegetables for his sweet roll snacks as a starting point.  Haydee Salter, MD

## 2021-04-19 ENCOUNTER — Ambulatory Visit: Payer: No Typology Code available for payment source | Admitting: Cardiology

## 2021-04-26 ENCOUNTER — Ambulatory Visit: Payer: No Typology Code available for payment source | Admitting: Cardiology

## 2021-04-29 DIAGNOSIS — I4510 Unspecified right bundle-branch block: Secondary | ICD-10-CM | POA: Insufficient documentation

## 2021-04-29 DIAGNOSIS — I77810 Thoracic aortic ectasia: Secondary | ICD-10-CM | POA: Insufficient documentation

## 2021-04-29 DIAGNOSIS — R002 Palpitations: Secondary | ICD-10-CM | POA: Insufficient documentation

## 2021-04-29 NOTE — Progress Notes (Signed)
Cardiology Office Note   Date:  04/30/2021   ID:  Gavin Hopkins, DOB [birth year], MRN [id number]  PCP:  Haydee Salter, MD  Cardiologist:   None Referring:  Self  Chief Complaint  Patient presents with   Abnormal ECG       History of Present Illness: Gavin Hopkins is a 65 y.o. male who is referred by himself for evaluation of atrial tachycardia.       He was previously seen by another cardiology group.  He has had PVCs and RBBB.  Oct 2020 he had a negative ischemia work up.  He had an unremarkable echo earlier this year.    He is switching care but has no new cardiovascular complaints.  He denies chest pressure, neck or arm discomfort.  He does not notice his PVCs.  He works full-time at [employer]  He has no new shortness of breath, PND or orthopnea.  Has had no presyncope or syncope.  He has had no weight gain or edema.   Past Medical History:  Diagnosis Date   Arthritis    oa   Diabetes mellitus without complication ([place])    type 2   Displaced fracture of lateral malleolus of right fibula, initial encounter for closed fracture 30 yrs ago   and tibula, full cast applied   Hypertension    Sleep apnea    could not tolerate cpap    Past Surgical History:  Procedure Laterality Date   FRACTURE SURGERY     LEG SURGERY Right    over 30 yrs ago   NO PAST SURGERIES     TONSILLECTOMY     TOTAL HIP ARTHROPLASTY Right 06/07/2019   Procedure: TOTAL HIP ARTHROPLASTY ANTERIOR APPROACH;  Surgeon: Gaynelle Arabian, MD;  Location: [facility];  Service: Orthopedics;  Laterality: Right;  168min     Current Outpatient Medications  Medication Sig Dispense Refill   Aromatic Inhalants (VICKS BABYRUB) OINT Apply 1 application topically at bedtime.      aspirin EC 81 MG tablet Take 81 mg by mouth daily. Swallow whole.     gabapentin (NEURONTIN) 300 MG capsule TAKE 1 CAPSULE BY MOUTH THREE TIMES A DAY 270 capsule 1   hydrochlorothiazide (HYDRODIURIL) 12.5 MG tablet TAKE 1 TABLET  BY MOUTH EVERY DAY 90 tablet 1   HYDROcodone-acetaminophen (NORCO) 5-325 MG tablet May take one daily prior to work 90 tablet 0   lovastatin (MEVACOR) 40 MG tablet TAKE 1 TABLET BY MOUTH EVERYDAY AT BEDTIME 90 tablet 0   metFORMIN (GLUCOPHAGE) 500 MG tablet TAKE 2 TABLETS (1,000 MG TOTAL) BY MOUTH DAILY IN THE AFTERNOON. 180 tablet 1   metoprolol succinate (TOPROL-XL) 100 MG 24 hr tablet TAKE 1 TABLET BY MOUTH EVERY DAY 90 tablet 1   Multiple Vitamin (MULTIVITAMIN WITH MINERALS) TABS tablet Take 1 tablet by mouth daily. Centrum Silver for Men 50+     valsartan (DIOVAN) 320 MG tablet TAKE 1 TABLET BY MOUTH EVERY DAY 90 tablet 3   verapamil (CALAN-SR) 240 MG CR tablet TAKE 1 TABLET BY MOUTH EVERYDAY AT BEDTIME 90 tablet 1   No current facility-administered medications for this visit.    Allergies:   Patient has no known allergies.    Social History:  The patient  reports that he has been smoking cigarettes. He has a 11.25 pack-year smoking history. He has never used smokeless tobacco. He reports that he does not currently use alcohol. He reports that he does not use drugs.  Family History:  The patient's family history includes Cancer in his maternal uncle; Diabetes in his brother and mother; Heart attack in his brother, maternal grandfather, maternal grandmother, and mother; Hyperlipidemia in his mother; Hypertension in his mother; Stroke in his mother.    ROS:  Please see the history of present illness.   Otherwise, review of systems are positive for none.   All other systems are reviewed and negative.    PHYSICAL EXAM: VS:  BP 128/76   Pulse 76   Ht 5\' 8"  (1.727 m)   Wt 211 lb 12.8 oz (96.1 kg)   SpO2 95%   BMI 32.20 kg/m  , BMI Body mass index is 32.2 kg/m. GENERAL:  Well appearing HEENT:  Pupils equal round and reactive, fundi not visualized, oral mucosa unremarkable NECK:  No jugular venous distention, waveform within normal limits, carotid upstroke brisk and symmetric, no  bruits, no thyromegaly LYMPHATICS:  No cervical, inguinal adenopathy LUNGS:  Clear to auscultation bilaterally BACK:  No CVA tenderness CHEST:  Unremarkable HEART:  PMI not displaced or sustained,S1 and S2 within normal limits, no S3, no S4, no clicks, no rubs, no murmurs ABD:  Flat, positive bowel sounds normal in frequency in pitch, no bruits, no rebound, no guarding, no midline pulsatile mass, no hepatomegaly, no splenomegaly EXT:  2 plus pulses throughout, no edema, no cyanosis no clubbing SKIN:  No rashes no nodules NEURO:  Cranial nerves II through XII grossly intact, motor grossly intact throughout PSYCH:  Cognitively intact, oriented to person place and time    EKG:  EKG is ordered today. The ekg ordered today demonstrates sinus rhythm, rate 76, premature ventricular contractions, left axis deviation, left anterior fascicular block borderline, no acute ST-T wave changes.   Recent Labs: 01/08/2021: ALT 30; BUN 20; Creatinine, Ser 1.29; Potassium 4.6; Sodium 138    Lipid Panel    Component Value Date/Time   CHOL 149 10/10/2020 0855   TRIG 209.0 (H) 10/10/2020 0855   HDL 44.10 10/10/2020 0855   CHOLHDL 3 10/10/2020 0855   VLDL 41.8 (H) 10/10/2020 0855   LDLCALC 67 04/19/2020 0824   LDLDIRECT 89.0 10/10/2020 0855      Wt Readings from Last 3 Encounters:  04/30/21 211 lb 12.8 oz (96.1 kg)  04/11/21 212 lb 3.2 oz (96.3 kg)  03/13/21 212 lb 6.4 oz (96.3 kg)      Other studies Reviewed: Additional studies/ records that were reviewed today include: Labs, previous cardiac studies. Review of the above records demonstrates:  Please see elsewhere in the note.     ASSESSMENT AND PLAN:  PVCs:   The patient does not really notice these.  We discussed the physiology.  No change in therapy.  RBBB: This is chronic.  He has no presyncope or syncope.  We again discussed the physiology and should he have any symptoms in the future particularly with lightheadedness or syncope he  would need to let me know.  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  I will follow this up with a dedicated CT in couple of years.  HTN:   His blood pressure is well controlled.  He is tolerating the meds as listed.  No change in therapy.  DM: A1c was 6.3.  No change in therapy.  DYSLIPIDEMIA: LDL 67 with an HDL of 44.   Current medicines are reviewed at length with the patient today.  The patient does not have concerns regarding medicines.  The following changes have  been made:  no change  Labs/ tests ordered today include: None  Orders Placed This Encounter  Procedures   EKG 12-Lead      Disposition:   FU with me in one year.     Signed, Minus Breeding, MD  04/30/2021 8:44 AM    [facility]

## 2021-04-30 ENCOUNTER — Ambulatory Visit: Payer: No Typology Code available for payment source | Admitting: Cardiology

## 2021-04-30 ENCOUNTER — Other Ambulatory Visit: Payer: Self-pay

## 2021-04-30 ENCOUNTER — Encounter: Payer: Self-pay | Admitting: Cardiology

## 2021-04-30 VITALS — BP 128/76 | HR 76 | Ht 68.0 in | Wt 211.8 lb

## 2021-04-30 DIAGNOSIS — I77810 Thoracic aortic ectasia: Secondary | ICD-10-CM | POA: Diagnosis not present

## 2021-04-30 DIAGNOSIS — R002 Palpitations: Secondary | ICD-10-CM | POA: Diagnosis not present

## 2021-04-30 DIAGNOSIS — I4510 Unspecified right bundle-branch block: Secondary | ICD-10-CM

## 2021-04-30 NOTE — Patient Instructions (Signed)
Medication Instructions:  Your Physician recommend you continue on your current medication as directed.    *If you need a refill on your cardiac medications before your next appointment, please call your pharmacy*   Lab Work: None ordered today   Testing/Procedures: None ordered today   Follow-Up: At [facility], you and your health needs are our priority.  As part of our continuing mission to provide you with exceptional heart care, we have created designated Provider Care Teams.  These Care Teams include your primary Cardiologist (physician) and Advanced Practice Providers (APPs -  Physician Assistants and Nurse Practitioners) who all work together to provide you with the care you need, when you need it.  We recommend signing up for the patient portal called "MyChart".  Sign up information is provided on this After Visit Summary.  MyChart is used to connect with patients for Virtual Visits (Telemedicine).  Patients are able to view lab/test results, encounter notes, upcoming appointments, etc.  Non-urgent messages can be sent to your provider as well.   To learn more about what you can do with MyChart, go to [email or web address].    Your next appointment:   1 year(s)  The format for your next appointment:   In Person  Provider:   James Hochrein, MD {        

## 2021-05-08 ENCOUNTER — Telehealth: Payer: Self-pay | Admitting: Family Medicine

## 2021-05-08 DIAGNOSIS — M1611 Unilateral primary osteoarthritis, right hip: Secondary | ICD-10-CM

## 2021-05-08 MED ORDER — HYDROCODONE-ACETAMINOPHEN 5-325 MG PO TABS: ORAL_TABLET | ORAL | 0 refills | Status: DC

## 2021-05-08 NOTE — Telephone Encounter (Signed)
What is the name of the medication? HYDROcodone-acetaminophen (NORCO) 5-325 MG tablet [[id number]]  Have you contacted your pharmacy to request a refill? Pt is needing a refill  Which pharmacy would you like this sent to?  [id number] - [place], [place] - [address]  [address], [place] [address]  Phone:  [phone]  Fax:  [phone]  DEA #:  [id number]   Patient notified that their request is being sent to the clinical staff for review and that they should receive a call once it is complete. If they do not receive a call within 72 hours they can check with their pharmacy or our office.

## 2021-05-08 NOTE — Telephone Encounter (Signed)
Refill request for: Hydrocodone/Acet 5/325 mg LR 02/06/21, #90. 0 rfs LOV 04/11/21 FOV 07/11/21  Please review and advise.  Thanks. Marland KitchenDm/cma

## 2021-05-09 NOTE — Telephone Encounter (Signed)
Patient notified VIA phone. Dm/cma  

## 2021-05-25 ENCOUNTER — Other Ambulatory Visit: Payer: Self-pay | Admitting: Cardiology

## 2021-06-02 LAB — HM DIABETES EYE EXAM

## 2021-06-03 ENCOUNTER — Other Ambulatory Visit: Payer: Self-pay | Admitting: Family Medicine

## 2021-06-24 ENCOUNTER — Other Ambulatory Visit: Payer: Self-pay | Admitting: Cardiology

## 2021-07-10 ENCOUNTER — Other Ambulatory Visit: Payer: Self-pay

## 2021-07-11 ENCOUNTER — Encounter: Payer: Self-pay | Admitting: Family Medicine

## 2021-07-11 ENCOUNTER — Ambulatory Visit: Payer: No Typology Code available for payment source | Admitting: Family Medicine

## 2021-07-11 ENCOUNTER — Other Ambulatory Visit: Payer: Self-pay | Admitting: Family Medicine

## 2021-07-11 VITALS — BP 138/80 | HR 72 | Temp 97.7°F | Ht 68.0 in | Wt 208.6 lb

## 2021-07-11 DIAGNOSIS — N1831 Chronic kidney disease, stage 3a: Secondary | ICD-10-CM

## 2021-07-11 DIAGNOSIS — I10 Essential (primary) hypertension: Secondary | ICD-10-CM

## 2021-07-11 DIAGNOSIS — E1169 Type 2 diabetes mellitus with other specified complication: Secondary | ICD-10-CM | POA: Diagnosis not present

## 2021-07-11 DIAGNOSIS — E785 Hyperlipidemia, unspecified: Secondary | ICD-10-CM

## 2021-07-11 DIAGNOSIS — Z1211 Encounter for screening for malignant neoplasm of colon: Secondary | ICD-10-CM

## 2021-07-11 DIAGNOSIS — Z125 Encounter for screening for malignant neoplasm of prostate: Secondary | ICD-10-CM | POA: Diagnosis not present

## 2021-07-11 DIAGNOSIS — E1122 Type 2 diabetes mellitus with diabetic chronic kidney disease: Secondary | ICD-10-CM | POA: Diagnosis not present

## 2021-07-11 DIAGNOSIS — Z720 Tobacco use: Secondary | ICD-10-CM | POA: Diagnosis not present

## 2021-07-11 LAB — URINALYSIS, ROUTINE W REFLEX MICROSCOPIC
Bilirubin Urine: NEGATIVE
Hgb urine dipstick: NEGATIVE
Ketones, ur: NEGATIVE
Leukocytes,Ua: NEGATIVE
Nitrite: NEGATIVE
Specific Gravity, Urine: 1.015 (ref 1.000–1.030)
Total Protein, Urine: 30 — AB
Urine Glucose: NEGATIVE
Urobilinogen, UA: 0.2 (ref 0.0–1.0)
pH: 7 (ref 5.0–8.0)

## 2021-07-11 LAB — LIPID PANEL
Cholesterol: 151 mg/dL (ref 0–200)
HDL: 60.1 mg/dL (ref >39.00)
LDL Cholesterol: 71 mg/dL (ref 0–99)
NonHDL: 90.88
Total CHOL/HDL Ratio: 3
Triglycerides: 101 mg/dL (ref 0.0–149.0)
VLDL: 20.2 mg/dL (ref 0.0–40.0)

## 2021-07-11 LAB — MICROALBUMIN / CREATININE URINE RATIO
Creatinine,U: 101.7 mg/dL
Microalb Creat Ratio: 25.1 mg/g (ref 0.0–30.0)
Microalb, Ur: 25.6 mg/dL — ABNORMAL HIGH (ref 0.0–1.9)

## 2021-07-11 LAB — BASIC METABOLIC PANEL
BUN: 16 mg/dL (ref 6–23)
CO2: 32 mEq/L (ref 19–32)
Calcium: 9.7 mg/dL (ref 8.4–10.5)
Chloride: 99 mEq/L (ref 96–112)
Creatinine, Ser: 1.21 mg/dL (ref 0.40–1.50)
GFR: 61.81 mL/min (ref >60.00)
Glucose, Bld: 117 mg/dL — ABNORMAL HIGH (ref 70–99)
Potassium: 4.5 mEq/L (ref 3.5–5.1)
Sodium: 138 mEq/L (ref 135–145)

## 2021-07-11 LAB — HEMOGLOBIN A1C: Hgb A1c MFr Bld: 6.2 % (ref 4.6–6.5)

## 2021-07-11 LAB — PSA: PSA: 0.68 ng/mL (ref 0.10–4.00)

## 2021-07-11 NOTE — Progress Notes (Signed)
[facility] [address] [place] [address] Dept: [phone] Dept Fax: [phone]  Chronic Care Office Visit  Subjective:    Patient ID: Murdock Jellison, male    DOB: [birth year], 65 y.o..   MRN: [id number]  Chief Complaint  Patient presents with   Follow-up    3 month f/u. No concerns.  Fasting today.  Declines flu and covid vaccines.     History of Present Illness:  Patient is in today for reassessment of chronic medical issues.  Mr. Dunavant has a history of Type 2 diabetes. He is managed on metformin. He has been working at weight loss and feels he has been able to drop some weight.  Mr. Kluender has a history of hyperlipidemia. He is managed on lovastatin.  Mr. Brandstetter has a history of hypertension. He is managed on HCTZ, metoprolol, verapamil, and valsartan. The betablockers also are helping to managed an issue with atrial tachycardia.   Mr. Dresser has a history of chronic low back pain. He has been on long-standing therapy with Norco 5/325  mg once a day. He is looking at retiring in about a year. He feels this will reduce the issues with his back pain.  Mr. Bruna has a history of of smoking since his teens. he has gradually cut his cigarettes back to ~ 1/2 ppd. He remains interested in quitting eventually.  Past Medical History: Patient Active Problem List   Diagnosis Date Noted   Palpitations 04/29/2021   RBBB 04/29/2021   Aortic root dilatation ([place]) 04/29/2021   Obesity (BMI 30.0-34.9) 01/08/2021   Tobacco abuse 02/14/2020   Hyperlipidemia associated with type 2 diabetes mellitus ([place]) 02/14/2020   Chronic midline low back pain without sciatica 10/12/2019   Osteoarthritis 10/12/2019   Hepatic steatosis 08/19/2019   Alcohol abuse 08/19/2019   Essential hypertension 06/28/2019   Type 2 diabetes mellitus with stage 3a chronic kidney disease, without long-term current use of insulin ([place])  06/28/2019   Atrial tachycardia ([place]) 06/28/2019   Chronic prescription opiate use 06/28/2019   Neuropathy of both upper extremities 06/28/2019   History of revision of total replacement of right hip joint 06/22/2019   Primary osteoarthritis of left hip 06/07/2019   Past Surgical History:  Procedure Laterality Date   FRACTURE SURGERY     LEG SURGERY Right    over 30 yrs ago   NO PAST SURGERIES     TONSILLECTOMY     TOTAL HIP ARTHROPLASTY Right 06/07/2019   Procedure: TOTAL HIP ARTHROPLASTY ANTERIOR APPROACH;  Surgeon: Gaynelle Arabian, MD;  Location: [facility];  Service: Orthopedics;  Laterality: Right;  183min   Family History  Problem Relation Age of Onset   Hypertension Mother    Hyperlipidemia Mother    Diabetes Mother    Heart attack Mother        stent   Stroke Mother    Heart attack Brother    Diabetes Brother    Cancer Maternal Uncle        Pancreatic   Heart attack Maternal Grandmother    Heart attack Maternal Grandfather    Outpatient Medications Prior to Visit  Medication Sig Dispense Refill   Aromatic Inhalants (VICKS BABYRUB) OINT Apply 1 application topically at bedtime.      aspirin EC 81 MG tablet Take 81 mg by mouth daily. Swallow whole.     gabapentin (NEURONTIN) 300 MG capsule TAKE 1 CAPSULE BY MOUTH THREE TIMES A DAY 270 capsule 1  hydrochlorothiazide (HYDRODIURIL) 12.5 MG tablet TAKE 1 TABLET BY MOUTH EVERY DAY 90 tablet 1   HYDROcodone-acetaminophen (NORCO) 5-325 MG tablet May take one daily prior to work 90 tablet 0   lovastatin (MEVACOR) 40 MG tablet TAKE 1 TABLET BY MOUTH EVERYDAY AT BEDTIME 90 tablet 0   metFORMIN (GLUCOPHAGE) 500 MG tablet TAKE 2 TABLETS (1,000 MG TOTAL) BY MOUTH DAILY IN THE AFTERNOON. 180 tablet 1   metoprolol succinate (TOPROL-XL) 100 MG 24 hr tablet TAKE 1 TABLET BY MOUTH EVERY DAY 90 tablet 1   Multiple Vitamin (MULTIVITAMIN WITH MINERALS) TABS tablet Take 1 tablet by mouth daily. Centrum Silver for Men 50+     valsartan (DIOVAN)  320 MG tablet TAKE 1 TABLET BY MOUTH EVERY DAY 90 tablet 3   verapamil (CALAN-SR) 240 MG CR tablet TAKE 1 TABLET BY MOUTH EVERYDAY AT BEDTIME 90 tablet 1   No facility-administered medications prior to visit.   No Known Allergies    Objective:   Today's Vitals   07/11/21 0931  BP: 138/80  Pulse: 72  Temp: 97.7 F (36.5 C)  TempSrc: Temporal  SpO2: 94%  Weight: 208 lb 9.6 oz (94.6 kg)  Height: 5\' 8"  (1.727 m)   Body mass index is 31.72 kg/m.   General: Well developed, well nourished. No acute distress. Psych: Alert and oriented. Normal mood and affect.  Health Maintenance Due  Topic Date Due   COVID-19 Vaccine (1) Never done   TETANUS/TDAP  Never done   COLONOSCOPY (Pts 45-37yrs Insurance coverage will need to be confirmed)  Never done   Zoster Vaccines- Shingrix (1 of 2) Never done     Assessment & Plan:   1. Type 2 diabetes mellitus with stage 3a chronic kidney disease, without long-term current use of insulin [facility]) Mr. Arata is due for his annual DM labs. We will continue metformin.  - Microalbumin / creatinine urine ratio - Basic metabolic panel - Hemoglobin A1c - Urinalysis, Routine w reflex microscopic  2. Essential hypertension Blood pressure is adequately controlled. Continue HCTZ, metoprolol, valsartan, and verapamil.  3. Hyperlipidemia associated with type 2 diabetes mellitus ([place]) Due for repeat lipids. Continue lovastatin.  - Lipid panel  4. Tobacco abuse Mr. Aletha Halim has been able to taper down from 3/4 ppd to 1/2 ppd. He has thought about using nicotine patches. We discussed how to approach patch use and taper down over time. We also discussed a technique for gradually reducing daily number of cigarettes. I discussed LDCT scan for lung cancer screening, but he wants to defer this until after his colonoscopy. He has had prior abdominal ultrasound and has no evidence of an aneurysm. I spent 5 minutes counseling him on tobacco cessation.  5.  Screening for colon cancer  - Ambulatory referral to Gastroenterology  6. Screening for prostate cancer  - PSA  Haydee Salter, MD

## 2021-07-23 ENCOUNTER — Other Ambulatory Visit: Payer: Self-pay | Admitting: Family Medicine

## 2021-07-26 ENCOUNTER — Encounter: Payer: Self-pay | Admitting: Family Medicine

## 2021-07-26 ENCOUNTER — Other Ambulatory Visit: Payer: Self-pay

## 2021-07-26 ENCOUNTER — Telehealth (INDEPENDENT_AMBULATORY_CARE_PROVIDER_SITE_OTHER): Payer: No Typology Code available for payment source | Admitting: Family Medicine

## 2021-07-26 ENCOUNTER — Telehealth: Payer: Self-pay | Admitting: Family Medicine

## 2021-07-26 VITALS — Temp 97.0°F

## 2021-07-26 DIAGNOSIS — U071 COVID-19: Secondary | ICD-10-CM

## 2021-07-26 MED ORDER — MOLNUPIRAVIR EUA 200MG CAPSULE: 4.0000 | ORAL_CAPSULE | Freq: Two times a day (BID) | ORAL | 0 refills | Status: AC

## 2021-07-26 MED ORDER — BENZONATATE 100 MG PO CAPS: ORAL_CAPSULE | ORAL | 0 refills | Status: DC

## 2021-07-26 NOTE — Telephone Encounter (Signed)
Letter written from information included in your note.  Then printed and called patient so that someone can pick it up.  Dm/cma

## 2021-07-26 NOTE — Progress Notes (Signed)
Virtual Visit via Telephone Note  I connected with Gavin Hopkins on 07/26/21 at 12:20 PM EST by telephone and verified that I am speaking with the correct person using two identifiers.   I discussed the limitations of performing an evaluation and management service by telephone and requested permission for a phone visit. The patient expressed understanding and agreed to proceed.  Location patient:  [place] Location provider: work or home office Participants present for the call: patient, provider Patient did not have a visit with me in the prior 7 days to address this/these issue(s).   History of Present Illness:  Acute telemedicine visit for Covid19: -Onset: about 2 days ago; tested positive for covid yesterday -reports is feeling better today -Symptoms include: nasal congestion, cough, night sweats, low grade fever -Denies: CP, SOB, NVD -inability to eat/drink -Pertinent past medical history: see below - reports has not had covid before -Pertinent medication allergies: No Known Allergies -COVID-19 vaccine status: has not had any of the covid vaccines Immunization History  Administered Date(s) Administered   Pneumococcal Conjugate-13 01/08/2021   Pneumococcal Polysaccharide-23 06/28/2019  -GFR was 61 on 12/14 -he is requesting Legevrio as his friends took this and did much better   Past Medical History:  Diagnosis Date   Arthritis    oa   Diabetes mellitus without complication ([place])    type 2   Displaced fracture of lateral malleolus of right fibula, initial encounter for closed fracture 30 yrs ago   and tibula, full cast applied   Hypertension    Sleep apnea    could not tolerate cpap    Current Outpatient Medications on File Prior to Visit  Medication Sig Dispense Refill   Aromatic Inhalants (VICKS BABYRUB) OINT Apply 1 application topically at bedtime.      aspirin EC 81 MG tablet Take 81 mg by mouth daily. Swallow whole.     gabapentin (NEURONTIN) 300 MG capsule  TAKE 1 CAPSULE BY MOUTH THREE TIMES A DAY 270 capsule 1   hydrochlorothiazide (HYDRODIURIL) 12.5 MG tablet TAKE 1 TABLET BY MOUTH EVERY DAY 90 tablet 3   HYDROcodone-acetaminophen (NORCO) 5-325 MG tablet May take one daily prior to work 90 tablet 0   lovastatin (MEVACOR) 40 MG tablet TAKE 1 TABLET BY MOUTH EVERYDAY AT BEDTIME 90 tablet 0   metFORMIN (GLUCOPHAGE) 500 MG tablet TAKE 2 TABLETS (1,000 MG TOTAL) BY MOUTH DAILY IN THE AFTERNOON. 180 tablet 1   metoprolol succinate (TOPROL-XL) 100 MG 24 hr tablet TAKE 1 TABLET BY MOUTH EVERY DAY 90 tablet 1   Multiple Vitamin (MULTIVITAMIN WITH MINERALS) TABS tablet Take 1 tablet by mouth daily. Centrum Silver for Men 50+     valsartan (DIOVAN) 320 MG tablet TAKE 1 TABLET BY MOUTH EVERY DAY 90 tablet 3   verapamil (CALAN-SR) 240 MG CR tablet TAKE 1 TABLET BY MOUTH EVERYDAY AT BEDTIME 90 tablet 1   No current facility-administered medications on file prior to visit.    Observations/Objective: Patient sounds cheerful and well on the phone. I do not appreciate any SOB. Speech and thought processing are grossly intact. Patient reported vitals:  Assessment and Plan:  COVID-19   Discussed treatment options and risk of drug interactions, ideal treatment window, potential complications, isolation and precautions for COVID-19.  He prefers to take Legevrio over Paxlovid after discussing with his friends. Discussed EUA status of this drug and the fact that there is preliminary limited knowledge of risks/interactions/side effects per EUA document vs possible benefits and precautions. This information  was shared with patient during the visit and also was provided in patient instructions.  The patient did want a prescription for cough, Tessalon Rx sent.  Other symptomatic care measures summarized in patient instructions. Work/School slipped offered: provided in patient instructions   Advised to seek prompt virtual visit or in person care if worsening, new  symptoms arise, or if is not improving with treatment as expected per our conversation of expected course. Discussed options for follow up care. Did let this patient know that I do telemedicine on Tuesdays and Thursdays for  and those are the days I am logged into the system. Advised to schedule follow up visit with PCP, [place] virtual visits or [facility] if any further questions or concerns to avoid delays in care.   I discussed the assessment and treatment plan with the patient. The patient was provided an opportunity to ask questions and all were answered. The patient agreed with the plan and demonstrated an understanding of the instructions.    Follow Up Instructions:  I did not refer this patient for an OV with me in the next 24 hours for this/these issue(s).  I discussed the assessment and treatment plan with the patient. The patient was provided an opportunity to ask questions and all were answered. The patient agreed with the plan and demonstrated an understanding of the instructions.   I spent 18 minutes on the date of this visit in the care of this patient. See summary of tasks completed to properly care for this patient in the detailed notes above which also included counseling of above, review of PMH, medications, allergies, evaluation of the patient and ordering and/or  instructing patient on testing and care options.     Lucretia Kern, DO

## 2021-07-26 NOTE — Telephone Encounter (Signed)
Patient had a VV with you and needs a letter for work or he will not get paid.  Can you please put one in the chart and then I can print it off here at [facility] [place] for someone to pick it ups since he doesn't have mychart?   Thanks.  Dm/cma

## 2021-07-26 NOTE — Patient Instructions (Addendum)
---------------------------------------------------------------------------------------------------------------------------    WORK SLIP:  Patient Gavin Hopkins,  July 16, 1956, was seen for a medical visit today, 07/26/21 . Please excuse from work for a COVID/flu like illness. If Covid19 testing is positive advise 10 days minimum from the onset of symptoms (07/24/21) PLUS 1 day of no fever and improved symptoms. Will defer to employer for a sooner return to work if patient has 2 negative covid tests 48 hours apart and is feeling better, or if symptoms have resolved, it is greater than 5 days since the positive test and the patient can wear a high-quality, tight fitting mask such as N95 or KN95 at all times for an additional 5 days. Would also suggest COVID19 antigen testing is negative prior to early return from a Covid illness.  Sincerely: E-signature: Dr. Colin Benton, DO [facility] Ph: [phone]   ------------------------------------------------------------------------------------------------------------------------------    HOME CARE TIPS:   -I sent the medication(s) we discussed to your pharmacy: Meds ordered this encounter  Medications   molnupiravir EUA (LAGEVRIO) 200 mg CAPS capsule    Sig: Take 4 capsules (800 mg total) by mouth 2 (two) times daily for 5 days.    Dispense:  40 capsule    Refill:  0   benzonatate (TESSALON PERLES) 100 MG capsule    Sig: 1-2 capsule up to twice daily as needed for cough    Dispense:  30 capsule    Refill:  0     -I sent in the [facility] treatment or referral you requested per our discussion. Please see the information provided below and discuss further with the pharmacist/treatment team.   -there is a chance of rebound illness after finishing your treatment. If you become sick again please isolate for an additional 5 days, plus 5 more days of masking.   -can use tylenol if needed for fevers, aches and pains  per instructions  -can use nasal saline a few times per day if you have nasal congestion  -stay hydrated, drink plenty of fluids and eat small healthy meals - avoid dairy  -stay home while sick, except to seek medical care. If you have COVID19, you will likely be contagious for 7-10 days. Flu or Influenza is likely contagious for about 7 days. Other respiratory viral infections remain contagious for 5-10+ days depending on the virus and many other factors. Wear a good mask that fits snugly (such as N95 or KN95) if around others to reduce the risk of transmission.  It was nice to meet you today, and I really hope you are feeling better soon. I help Gavin Hopkins out with telemedicine visits on Tuesdays and Thursdays and am happy to help if you need a follow up virtual visit on those days. Otherwise, if you have any concerns or questions following this visit please schedule a follow up visit with your Primary Care doctor or seek care at a local urgent care clinic to avoid delays in care.    Seek in person care or schedule a follow up video visit promptly if your symptoms worsen, new concerns arise or you are not improving with treatment. Call 911 and/or seek emergency care if your symptoms are severe or life threatening.    Fact Sheet for Patients And Caregivers Emergency Use Authorization (EUA) Of LAGEVRIO (molnupiravir) capsules For Coronavirus Disease 2019 (COVID-19)  What is the most important information I should know about LAGEVRIO? LAGEVRIO may cause serious side effects, including: ? LAGEVRIO may cause harm to your unborn baby.  It is not known if LAGEVRIO will harm your baby if you take LAGEVRIO during pregnancy. o LAGEVRIO is not recommended for use in pregnancy. o LAGEVRIO has not been studied in pregnancy. LAGEVRIO was studied in pregnant animals only. When LAGEVRIO was given to pregnant animals, LAGEVRIO caused harm to their unborn babies. o You and your healthcare provider may  decide that you should take LAGEVRIO during pregnancy if there are no other COVID-19 treatment options approved or authorized by the FDA that are accessible or clinically appropriate for you. o If you and your healthcare provider decide that you should take LAGEVRIO during pregnancy, you and your healthcare provider should discuss the known and potential benefits and the potential risks of taking LAGEVRIO during pregnancy. For individuals who are able to become pregnant: ? You should use a reliable method of birth control (contraception) consistently and correctly during treatment with LAGEVRIO and for 4 days after the last dose of LAGEVRIO. Talk to your healthcare provider about reliable birth control methods. ? Before starting treatment with [facility] your healthcare provider may do a pregnancy test to see if you are pregnant before starting treatment with LAGEVRIO. ? Tell your healthcare provider right away if you become pregnant or think you may be pregnant during treatment with LAGEVRIO. Pregnancy Surveillance Program: ? There is a pregnancy surveillance program for individuals who take LAGEVRIO during pregnancy. The purpose of this program is to collect information about the health of you and your baby. Talk to your healthcare provider about how to take part in this program. ? If you take LAGEVRIO during pregnancy and you agree to participate in the pregnancy surveillance program and allow your healthcare provider to share your information with [place], then your healthcare provider will report your use of [place] during pregnancy to [place]. by calling [phone] or PeacefulBlog.es. For individuals who are sexually active with partners who are able to become pregnant: ? It is not known if LAGEVRIO can affect sperm. While the risk is regarded as low, animal studies to fully assess the potential for LAGEVRIO to affect the babies of males  treated with LAGEVRIO have not been completed. A reliable method of birth control (contraception) should be used consistently and correctly during treatment with LAGEVRIO and for at least 3 months after the last dose. The risk to sperm beyond 3 months is not known. Studies to understand the risk to sperm beyond 3 months are ongoing. Talk to your healthcare provider about reliable birth control methods. Talk to your healthcare provider if you have questions or concerns about how LAGEVRIO may affect sperm. You are being given this fact sheet because your healthcare provider believes it is necessary to provide you with LAGEVRIO for the treatment of adults with mild-to-moderate coronavirus disease 2019 (COVID-19) with positive results of direct SARS-CoV-2 viral testing, and who are at high risk for progression to severe COVID-19 including hospitalization or death, and for whom other COVID-19 treatment options approved or authorized by the FDA are not accessible or clinically appropriate. The U.S. Food and Drug Administration (FDA) has issued an Emergency Use Authorization (EUA) to make LAGEVRIO available during the COVID-19 pandemic (for more details about an EUA please see What is an Emergency Use Authorization? at the end of this document). LAGEVRIO is not an FDA-approved medicine in the [place]. Read this Fact Sheet for information about LAGEVRIO. Talk to your healthcare provider about your options if you have any questions. It is your  choice to take LAGEVRIO.  What is COVID-19? COVID-19 is caused by a virus called a coronavirus. You can get COVID-19 through close contact with another person who has the virus. COVID-19 illnesses have ranged from very mild-to-severe, including illness resulting in death. While information so far suggests that most COVID-19 illness is mild, serious illness can happen and may cause some of your other medical conditions to become worse. Older people and  people of all ages with severe, long lasting (chronic) medical conditions like heart disease, lung disease and diabetes, for example seem to be at higher risk of being hospitalized for COVID-19.  What is LAGEVRIO? LAGEVRIO is an investigational medicine used to treat mild-to-moderate COVID-19 in adults: ? with positive results of direct SARS-CoV-2 viral testing, and ? who are at high risk for progression to severe COVID-19 including hospitalization or death, and for whom other COVID-19 treatment options approved or authorized by the FDA are not accessible or clinically appropriate. The FDA has authorized the emergency use of LAGEVRIO for the treatment of mild-tomoderate COVID-19 in adults under an EUA. For more information on EUA, see the What is an Emergency Use Authorization (EUA)? section at the end of this Fact Sheet. LAGEVRIO is not authorized: ? for use in people less than 19 years of age. ? for prevention of COVID-19. ? for people needing hospitalization for COVID-19. ? for use for longer than 5 consecutive days.  What should I tell my healthcare provider before I take LAGEVRIO? Tell your healthcare provider if you: ? Have any allergies ? Are breastfeeding or plan to breastfeed ? Have any serious illnesses ? Are taking any medicines (prescription, over-the-counter, vitamins, or herbal products).  How do I take LAGEVRIO? ? Take LAGEVRIO exactly as your healthcare provider tells you to take it. ? Take 4 capsules of LAGEVRIO every 12 hours (for example, at 8 am and at 8 pm) ? Take LAGEVRIO for 5 days. It is important that you complete the full 5 days of treatment with LAGEVRIO. Do not stop taking LAGEVRIO before you complete the full 5 days of treatment, even if you feel better. ? Take LAGEVRIO with or without food. ? You should stay in isolation for as long as your healthcare provider tells you to. Talk to your healthcare provider if you are not sure about how to properly  isolate while you have COVID-19. ? Swallow LAGEVRIO capsules whole. Do not open, break, or crush the capsules. If you cannot swallow capsules whole, tell your healthcare provider. ? What to do if you miss a dose: o If it has been less than 10 hours since the missed dose, take it as soon as you remember o If it has been more than 10 hours since the missed dose, skip the missed dose and take your dose at the next scheduled time. ? Do not double the dose of LAGEVRIO to make up for a missed dose.  What are the important possible side effects of LAGEVRIO? ? See, What is the most important information I should know about LAGEVRIO? ? Allergic Reactions. Allergic reactions can happen in people taking LAGEVRIO, even after only 1 dose. Stop taking LAGEVRIO and call your healthcare provider right away if you get any of the following symptoms of an allergic reaction: o hives o rapid heartbeat o trouble swallowing or breathing o swelling of the mouth, lips, or face o throat tightness o hoarseness o skin rash The most common side effects of LAGEVRIO are: ? diarrhea ? nausea ?  dizziness These are not all the possible side effects of LAGEVRIO. Not many people have taken LAGEVRIO. Serious and unexpected side effects may happen. This medicine is still being studied, so it is possible that all of the risks are not known at this time.  What other treatment choices are there?  Veklury (remdesivir) is FDA-approved as an intravenous (IV) infusion for the treatment of mildto-moderate [id number]-76 in certain adults and children. Talk with your doctor to see if Marijean Heath is appropriate for you. Like LAGEVRIO, FDA may also allow for the emergency use of other medicines to treat people with COVID-19. Go to LacrosseProperties.si for more information. It is your choice to be treated or not to be treated with  LAGEVRIO. Should you decide not to take it, it will not change your standard medical care.  What if I am breastfeeding? Breastfeeding is not recommended during treatment with LAGEVRIO and for 4 days after the last dose of LAGEVRIO. If you are breastfeeding or plan to breastfeed, talk to your healthcare provider about your options and specific situation before taking LAGEVRIO.  How do I report side effects with LAGEVRIO? Contact your healthcare provider if you have any side effects that bother you or do not go away. Report side effects to FDA MedWatch at SmoothHits.hu or call 1-800-FDA-1088 (1- ([phone]).  How should I store [place]? ? Store LAGEVRIO capsules at room temperature between 4F to 89F (20C to 25C). ? Keep LAGEVRIO and all medicines out of the reach of children and pets. How can I learn more about COVID-19? ? Ask your healthcare provider. ? Visit SeekRooms.co.uk ? Contact your local or state public health department. ? Call [facility] at [phone] (toll free in the [place] ? Visit [email or web address].com  What Is an Emergency Use Authorization (EUA)? The [place] FDA has made [place] available under an emergency access mechanism called an Emergency Use Authorization (EUA) The EUA is supported by a Presenter, broadcasting Health and Human Service (HHS) declaration that circumstances exist to justify emergency use of drugs and biological products during the COVID-19 pandemic. LAGEVRIO for the treatment of mild-to-moderate COVID-19 in adults with positive results of direct SARS-CoV-2 viral testing, who are at high risk for progression to severe COVID-19, including hospitalization or death, and for whom alternative COVID-19 treatment options approved or authorized by FDA are not accessible or clinically appropriate, has not undergone the same type of review as an FDA-approved product. In issuing an EUA under the BWLSL-37 public health emergency, the FDA  has determined, among other things, that based on the total amount of scientific evidence available including data from adequate and well-controlled clinical trials, if available, it is reasonable to believe that the product may be effective for diagnosing, treating, or preventing COVID-19, or a serious or life-threatening disease or condition caused by COVID-19; that the known and potential benefits of the product, when used to diagnose, treat, or prevent such disease or condition, outweigh the known and potential risks of such product; and that there are no adequate, approved, and available alternatives.  All of these criteria must be met to allow for the product to be used in the treatment of patients during the COVID-19 pandemic. The EUA for LAGEVRIO is in effect for the duration of the COVID-19 declaration justifying emergency use of LAGEVRIO, unless terminated or revoked (after which LAGEVRIO may no longer be used under the EUA). For patent information: http://rogers.info/ Copyright  2021-2022 [place] and its affiliates. All rights  reserved. [id number] Revised: March 2022

## 2021-08-06 ENCOUNTER — Other Ambulatory Visit: Payer: Self-pay | Admitting: Family Medicine

## 2021-08-06 DIAGNOSIS — M1611 Unilateral primary osteoarthritis, right hip: Secondary | ICD-10-CM

## 2021-08-06 MED ORDER — HYDROCODONE-ACETAMINOPHEN 5-325 MG PO TABS: ORAL_TABLET | ORAL | 0 refills | Status: DC

## 2021-08-06 NOTE — Telephone Encounter (Signed)
Refill request for: Hydrocodone/Acet 5/325 mg LR 05/08/21, #90, 0 rf LOV 07/11/21 FOV  10/10/2021  Please review and advise.  Thanks. Dm/cma

## 2021-08-07 NOTE — Telephone Encounter (Signed)
Patient notified VIA phone. Dm/cma  

## 2021-08-22 ENCOUNTER — Other Ambulatory Visit: Payer: Self-pay | Admitting: Family Medicine

## 2021-08-28 ENCOUNTER — Other Ambulatory Visit: Payer: Self-pay | Admitting: Family Medicine

## 2021-08-28 DIAGNOSIS — M545 Low back pain, unspecified: Secondary | ICD-10-CM

## 2021-08-28 DIAGNOSIS — G5693 Unspecified mononeuropathy of bilateral upper limbs: Secondary | ICD-10-CM

## 2021-08-29 NOTE — Telephone Encounter (Signed)
Refill request for: Gabapentin 300 mg LR  03/02/21, #270, 1 rf LOV  07/11/21 FOV  10/10/21  Please review and advise.  Thanks. Dm/cma

## 2021-10-09 ENCOUNTER — Other Ambulatory Visit: Payer: Self-pay

## 2021-10-10 ENCOUNTER — Encounter: Payer: Self-pay | Admitting: Family Medicine

## 2021-10-10 ENCOUNTER — Ambulatory Visit: Payer: No Typology Code available for payment source | Admitting: Family Medicine

## 2021-10-10 VITALS — BP 136/84 | HR 75 | Temp 97.9°F | Ht 68.0 in | Wt 196.0 lb

## 2021-10-10 DIAGNOSIS — I10 Essential (primary) hypertension: Secondary | ICD-10-CM

## 2021-10-10 DIAGNOSIS — E782 Mixed hyperlipidemia: Secondary | ICD-10-CM | POA: Diagnosis not present

## 2021-10-10 DIAGNOSIS — N1831 Chronic kidney disease, stage 3a: Secondary | ICD-10-CM | POA: Diagnosis not present

## 2021-10-10 DIAGNOSIS — E1122 Type 2 diabetes mellitus with diabetic chronic kidney disease: Secondary | ICD-10-CM

## 2021-10-10 DIAGNOSIS — E663 Overweight: Secondary | ICD-10-CM

## 2021-10-10 LAB — HEMOGLOBIN A1C: Hgb A1c MFr Bld: 6.1 % (ref 4.6–6.5)

## 2021-10-10 LAB — GLUCOSE, RANDOM: Glucose, Bld: 111 mg/dL — ABNORMAL HIGH (ref 70–99)

## 2021-10-10 NOTE — Progress Notes (Signed)
?[facility] ?[facility] ?[place] ?[place] [address] ?Dept: [phone] ?Dept Fax: [phone] ? ?Chronic Care Office Visit ? ?Subjective:  ? ? Patient ID: Gavin Hopkins, male    DOB: Jul 03, 1956, 66 y.o..   MRN: [id number] ? ?Chief Complaint  ?Patient presents with  ? Follow-up  ?  3 month f/u.  No concerns.  Fasting today.  ?  ? ? ?History of Present Illness: ? ?Patient is in today for reassessment of chronic medical issues. ? ?Gavin Hopkins has a history of Type 2 diabetes. He is managed on metformin. He has noted he is losing weight. He attributes this to how busy he has been at work. He is anticipating retirement sometime in the Fall. ?  ?Gavin Hopkins has a history of hyperlipidemia. He is managed on lovastatin. ?  ?Gavin Hopkins has a history of hypertension. He is managed on HCTZ, metoprolol, verapamil, and valsartan. The beta-blockers also are helping to managed an issue with atrial tachycardia. He has not noted any palpitations. ?  ?Gavin Hopkins has a history of chronic low back pain. He has been on long-standing therapy with Norco 5/325  mg once a day. He is looking at retiring later this year. He feels this will reduce the issues with his back pain. ?  ?Gavin Hopkins has a history of of smoking since his teens. He has gradually cut his cigarettes back to ~ 1/2 ppd. He remains interested in quitting eventually. ? ?Past Medical History: ?Patient Active Problem List  ? Diagnosis Date Noted  ? Chronic kidney disease, stage 3a ([place]) 10/10/2021  ? Palpitations 04/29/2021  ? RBBB 04/29/2021  ? Aortic root dilatation ([place]) 04/29/2021  ? Overweight (BMI 25.0-29.9) 01/08/2021  ? Tobacco abuse 02/14/2020  ? Hyperlipidemia 02/14/2020  ? Chronic midline low back pain without sciatica 10/12/2019  ? Osteoarthritis 10/12/2019  ? Hepatic steatosis 08/19/2019  ? Alcohol abuse 08/19/2019  ? Essential hypertension 06/28/2019  ? Type 2 diabetes mellitus with stage  3a chronic kidney disease, without long-term current use of insulin ([place]) 06/28/2019  ? Atrial tachycardia ([place]) 06/28/2019  ? Chronic prescription opiate use 06/28/2019  ? Neuropathy of both upper extremities 06/28/2019  ? History of revision of total replacement of right hip joint 06/22/2019  ? Primary osteoarthritis of left hip 06/07/2019  ? ?Past Surgical History:  ?Procedure Laterality Date  ? FRACTURE SURGERY    ? LEG SURGERY Right   ? over 30 yrs ago  ? NO PAST SURGERIES    ? TONSILLECTOMY    ? TOTAL HIP ARTHROPLASTY Right 06/07/2019  ? Procedure: TOTAL HIP ARTHROPLASTY ANTERIOR APPROACH;  Surgeon: Gaynelle Arabian, MD;  Location: [facility];  Service: Orthopedics;  Laterality: Right;  130mn  ? ?Family History  ?Problem Relation Age of Onset  ? Hypertension Mother   ? Hyperlipidemia Mother   ? Diabetes Mother   ? Heart attack Mother   ?     stent  ? Stroke Mother   ? Heart attack Brother   ? Diabetes Brother   ? Cancer Maternal Uncle   ?     Pancreatic  ? Heart attack Maternal Grandmother   ? Heart attack Maternal Grandfather   ? ?Outpatient Medications Prior to Visit  ?Medication Sig Dispense Refill  ? Aromatic Inhalants (VICKS BABYRUB) OINT Apply 1 application topically at bedtime.     ? aspirin EC 81 MG tablet Take 81 mg by mouth daily. Swallow whole.    ? benzonatate (TESSALON PERLES) 100  MG capsule 1-2 capsule up to twice daily as needed for cough 30 capsule 0  ? gabapentin (NEURONTIN) 300 MG capsule TAKE 1 CAPSULE BY MOUTH THREE TIMES A DAY 270 capsule 3  ? hydrochlorothiazide (HYDRODIURIL) 12.5 MG tablet TAKE 1 TABLET BY MOUTH EVERY DAY 90 tablet 3  ? HYDROcodone-acetaminophen (NORCO) 5-325 MG tablet May take one daily prior to work 90 tablet 0  ? lovastatin (MEVACOR) 40 MG tablet TAKE 1 TABLET BY MOUTH EVERYDAY AT BEDTIME 90 tablet 3  ? metFORMIN (GLUCOPHAGE) 500 MG tablet TAKE 2 TABLETS (1,000 MG TOTAL) BY MOUTH DAILY IN THE AFTERNOON. 180 tablet 1  ? metoprolol succinate (TOPROL-XL) 100 MG 24 hr tablet  TAKE 1 TABLET BY MOUTH EVERY DAY 90 tablet 1  ? Multiple Vitamin (MULTIVITAMIN WITH MINERALS) TABS tablet Take 1 tablet by mouth daily. Centrum Silver for Men 50+    ? valsartan (DIOVAN) 320 MG tablet TAKE 1 TABLET BY MOUTH EVERY DAY 90 tablet 3  ? verapamil (CALAN-SR) 240 MG CR tablet TAKE 1 TABLET BY MOUTH EVERYDAY AT BEDTIME 90 tablet 1  ? ?No facility-administered medications prior to visit.  ? ?No Known Allergies ?   ?Objective:  ? ?Today's Vitals  ? 10/10/21 1109  ?BP: 136/84  ?Pulse: 75  ?Temp: 97.9 ?F (36.6 ?C)  ?TempSrc: Temporal  ?SpO2: 95%  ?Weight: 196 lb (88.9 kg)  ?Height: '5\' 8"'$  (1.727 m)  ? ?Body mass index is 29.8 kg/m?.  ? ?General: Well developed, well nourished. No acute distress. ?Psych: Alert and oriented. Normal mood and affect. ? ?Health Maintenance Due  ?Topic Date Due  ? TETANUS/TDAP  Never done  ? COLONOSCOPY (Pts 45-8yr Insurance coverage will need to be confirmed)  Never done  ? Zoster Vaccines- Shingrix (1 of 2) Never done  ?   ?Lab Results: ?Lab Results  ?Component Value Date  ? HGBA1C 6.2 07/11/2021  ? ?BMP Latest Ref Rng & Units 07/11/2021 01/08/2021 04/19/2020  ?Glucose 70 - 99 mg/dL 117(H) 91 99  ?BUN 6 - 23 mg/dL '16 20 21  '$ ?Creatinine 0.40 - 1.50 mg/dL 1.21 1.29 1.35  ?Sodium 135 - 145 mEq/L 138 138 136  ?Potassium 3.5 - 5.1 mEq/L 4.5 4.6 4.6  ?Chloride 96 - 112 mEq/L 99 102 101  ?CO2 19 - 32 mEq/L 32 29 27  ?Calcium 8.4 - 10.5 mg/dL 9.7 10.0 9.8  ? ?Lab Results  ?Component Value Date  ? CHOL 151 07/11/2021  ? HDL 60.10 07/11/2021  ? LF[place]71 07/11/2021  ? LDLDIRECT 89.0 10/10/2020  ? TRIG 101.0 07/11/2021  ? CHOLHDL 3 07/11/2021  ? ?Assessment & Plan:  ? ?1. Type 2 diabetes mellitus with stage 3a chronic kidney disease, without long-term current use of insulin ([place] ?Last A1c was at goal. Continue metformin. ? ?- Glucose, random ?- Hemoglobin A1c ? ?2. Chronic kidney disease, stage 3a ([place] ?Blood pressure is adequately controled.  ? ?3. Mixed hyperlipidemia ?Last lipids at  goal. Continue lovastatin 40 mg daily. ? ?4. Essential hypertension ?Blood pressure adequately managed on HCTZ, metoprolol, verapamil, and valsartan. ? ?5. Overweight (BMI 25.0-29.9) ?Weight is down 16 lbs. since Sept. He has moved from the Obese tot he overweight range. Ultimately this will be better for his health. ? ?Return in about 3 months (around 01/10/2022) for Reassessment.  ? ?SHaydee Salter MD ?

## 2021-11-01 ENCOUNTER — Telehealth: Payer: Self-pay | Admitting: Family Medicine

## 2021-11-01 DIAGNOSIS — M1611 Unilateral primary osteoarthritis, right hip: Secondary | ICD-10-CM

## 2021-11-01 NOTE — Telephone Encounter (Signed)
Pt needs a refill for his Hydrocodone. ?

## 2021-11-01 NOTE — Telephone Encounter (Signed)
Refill request for : ?Hydrocodone/Acet 5/325 mg ?LR 08/06/21, #90, 0 rf ?LOV 10/10/21 ?FOV 01/16/22 ? ? ?Spoke to patient and he has enough to last till Tuesday.   ?Please review and advise.  ?Thanks.  ?Dm/cma ? ?

## 2021-11-05 MED ORDER — HYDROCODONE-ACETAMINOPHEN 5-325 MG PO TABS: ORAL_TABLET | ORAL | 0 refills | Status: DC

## 2021-11-05 NOTE — Telephone Encounter (Signed)
Patient notified VIA phone. Dm/cma  

## 2021-11-05 NOTE — Telephone Encounter (Signed)
Pt called to check in with this refill... says he has one pill left.  ?

## 2021-11-06 ENCOUNTER — Other Ambulatory Visit: Payer: Self-pay | Admitting: Family Medicine

## 2021-11-06 DIAGNOSIS — M1611 Unilateral primary osteoarthritis, right hip: Secondary | ICD-10-CM

## 2021-11-06 NOTE — Telephone Encounter (Signed)
Pts pharmacy told him the dosage of his hydrocodone is out of stock. He would like to know if there is another dosage or can it be cut or any kind of adjustments. [facility] other [facility] and [facility] were all [facility] of stock. Please advise. ?

## 2021-11-06 NOTE — Telephone Encounter (Signed)
[facility], the Hydrocodone 5/325 mg is on back order with no date of when it will be available.  They do hav the 7.5/325 and 10/325 available.  Do you want to change it to one of these?  Please review and advise.  ?Thanks. Dm/cma ? ?

## 2021-11-07 MED ORDER — HYDROCODONE-ACETAMINOPHEN 5-325 MG PO TABS: ORAL_TABLET | ORAL | 0 refills | Status: DC

## 2021-11-07 NOTE — Addendum Note (Signed)
Addended by: Konrad Saha on: 11/07/2021 10:39 AM ? ? Modules accepted: Orders ? ?

## 2021-11-07 NOTE — Telephone Encounter (Signed)
Patient called and found that [facility] on [address] has the Hydrocodone/Acet 5/325 mg. Can you please re-send to them.  ?Thanks.  Dm/cma ? ?

## 2021-11-07 NOTE — Telephone Encounter (Signed)
Lft Vm to rtn call. Dm/cma ? ?

## 2021-11-07 NOTE — Telephone Encounter (Signed)
Spoke to patient he will check to see if there is another pharmcy that might have them and call us back. Dm/cma ? ?

## 2021-11-07 NOTE — Telephone Encounter (Signed)
Patient notified VIA phone. Dm/cma  

## 2021-11-19 ENCOUNTER — Other Ambulatory Visit: Payer: Self-pay | Admitting: Cardiology

## 2021-12-04 ENCOUNTER — Other Ambulatory Visit: Payer: Self-pay | Admitting: Family Medicine

## 2021-12-21 ENCOUNTER — Other Ambulatory Visit: Payer: Self-pay | Admitting: Cardiology

## 2022-01-16 ENCOUNTER — Encounter: Payer: Self-pay | Admitting: Family Medicine

## 2022-01-16 ENCOUNTER — Ambulatory Visit: Payer: No Typology Code available for payment source | Admitting: Family Medicine

## 2022-01-16 VITALS — BP 130/80 | HR 70 | Temp 97.5°F | Ht 68.0 in | Wt 195.2 lb

## 2022-01-16 DIAGNOSIS — N1831 Chronic kidney disease, stage 3a: Secondary | ICD-10-CM | POA: Diagnosis not present

## 2022-01-16 DIAGNOSIS — E1122 Type 2 diabetes mellitus with diabetic chronic kidney disease: Secondary | ICD-10-CM

## 2022-01-16 DIAGNOSIS — I10 Essential (primary) hypertension: Secondary | ICD-10-CM | POA: Diagnosis not present

## 2022-01-16 DIAGNOSIS — D229 Melanocytic nevi, unspecified: Secondary | ICD-10-CM

## 2022-01-16 DIAGNOSIS — E782 Mixed hyperlipidemia: Secondary | ICD-10-CM

## 2022-01-16 DIAGNOSIS — Z23 Encounter for immunization: Secondary | ICD-10-CM | POA: Diagnosis not present

## 2022-01-16 LAB — LIPID PANEL
Cholesterol: 156 mg/dL (ref 0–200)
HDL: 60.7 mg/dL (ref >39.00)
LDL Cholesterol: 72 mg/dL (ref 0–99)
NonHDL: 95.34
Total CHOL/HDL Ratio: 3
Triglycerides: 119 mg/dL (ref 0.0–149.0)
VLDL: 23.8 mg/dL (ref 0.0–40.0)

## 2022-01-16 LAB — HEMOGLOBIN A1C: Hgb A1c MFr Bld: 6 % (ref 4.6–6.5)

## 2022-01-16 LAB — GLUCOSE, RANDOM: Glucose, Bld: 96 mg/dL (ref 70–99)

## 2022-01-16 NOTE — Progress Notes (Signed)
[facility] [address] [place] [address] Dept: [phone] Dept Fax: [phone]  Chronic Care Office Visit  Subjective:    Patient ID: Gavin Hopkins, male    DOB: Nov 24, 1955, 66 y.o..   MRN: [id number]  Chief Complaint  Patient presents with   Follow-up    3 month f/u.  Fasting today.   C/o having a bump next to the RT eye     History of Present Illness:  Patient is in today for reassessment of chronic medical issues.  Gavin Hopkins has a history of Type 2 diabetes. He is managed on metformin 500 mg, two tabs daily. At his last visit he had lost some significant weight. He thinks he may still be losing at this point. He continues to work for [employer], but toys with retirement.   Gavin Hopkins has a history of hyperlipidemia. He is managed on lovastatin 40 mg daily.   Gavin Hopkins has a history of hypertension. He is managed on HCTZ 12.5 mg daily, metoprolol XL 100 mg daily, verapamil 240 mg daily, and valsartan 320 mg daily. The beta-blockers also are helping to managed an issue with atrial tachycardia. He has not noted any palpitations.   Gavin Hopkins notes a small lump near his right eye. He wonders if this is something significant.  Past Medical History: Patient Active Problem List   Diagnosis Date Noted   Chronic kidney disease, stage 3a ([place]) 10/10/2021   Palpitations 04/29/2021   RBBB 04/29/2021   Aortic root dilatation ([place]) 04/29/2021   Overweight (BMI 25.0-29.9) 01/08/2021   Tobacco abuse 02/14/2020   Hyperlipidemia 02/14/2020   Chronic midline low back pain without sciatica 10/12/2019   Osteoarthritis 10/12/2019   Hepatic steatosis 08/19/2019   Alcohol abuse 08/19/2019   Essential hypertension 06/28/2019   Type 2 diabetes mellitus with stage 3a chronic kidney disease, without long-term current use of insulin ([place]) 06/28/2019   Atrial tachycardia ([place]) 06/28/2019   Chronic prescription  opiate use 06/28/2019   Neuropathy of both upper extremities 06/28/2019   History of revision of total replacement of right hip joint 06/22/2019   Primary osteoarthritis of left hip 06/07/2019   Past Surgical History:  Procedure Laterality Date   FRACTURE SURGERY     LEG SURGERY Right    over 30 yrs ago   NO PAST SURGERIES     TONSILLECTOMY     TOTAL HIP ARTHROPLASTY Right 06/07/2019   Procedure: TOTAL HIP ARTHROPLASTY ANTERIOR APPROACH;  Surgeon: Gaynelle Arabian, MD;  Location: [facility];  Service: Orthopedics;  Laterality: Right;  145mn   Family History  Problem Relation Age of Onset   Hypertension Mother    Hyperlipidemia Mother    Diabetes Mother    Heart attack Mother        stent   Stroke Mother    Heart attack Brother    Diabetes Brother    Cancer Maternal Uncle        Pancreatic   Heart attack Maternal Grandmother    Heart attack Maternal Grandfather    Outpatient Medications Prior to Visit  Medication Sig Dispense Refill   Aromatic Inhalants (VICKS BABYRUB) OINT Apply 1 application topically at bedtime.      aspirin EC 81 MG tablet Take 81 mg by mouth daily. Swallow whole.     gabapentin (NEURONTIN) 300 MG capsule TAKE 1 CAPSULE BY MOUTH THREE TIMES A DAY 270 capsule 3   hydrochlorothiazide (HYDRODIURIL) 12.5 MG tablet TAKE 1 TABLET  BY MOUTH EVERY DAY 90 tablet 3   HYDROcodone-acetaminophen (NORCO) 5-325 MG tablet May take one daily prior to work 90 tablet 0   lovastatin (MEVACOR) 40 MG tablet TAKE 1 TABLET BY MOUTH EVERYDAY AT BEDTIME 90 tablet 3   metFORMIN (GLUCOPHAGE) 500 MG tablet TAKE 2 TABLETS (1,000 MG TOTAL) BY MOUTH DAILY IN THE AFTERNOON. 180 tablet 1   metoprolol succinate (TOPROL-XL) 100 MG 24 hr tablet TAKE 1 TABLET BY MOUTH EVERY DAY 90 tablet 1   Multiple Vitamin (MULTIVITAMIN WITH MINERALS) TABS tablet Take 1 tablet by mouth daily. Centrum Silver for Men 50+     valsartan (DIOVAN) 320 MG tablet TAKE 1 TABLET BY MOUTH EVERY DAY 90 tablet 3   verapamil  (CALAN-SR) 240 MG CR tablet TAKE 1 TABLET BY MOUTH EVERYDAY AT BEDTIME 90 tablet 0   benzonatate (TESSALON PERLES) 100 MG capsule 1-2 capsule up to twice daily as needed for cough (Patient not taking: Reported on 01/16/2022) 30 capsule 0   No facility-administered medications prior to visit.   No Known Allergies    Objective:   Today's Vitals   01/16/22 1107  BP: 130/80  Pulse: 70  Temp: (!) 97.5 F (36.4 C)  TempSrc: Temporal  SpO2: 94%  Weight: 195 lb 3.2 oz (88.5 kg)  Height: '5\' 8"'$  (1.727 m)   Body mass index is 29.68 kg/m.   General: Well developed, well nourished. No acute distress. Feet- Skin intact. No sign of maceration between toes. Nails are normal. Dorsalis pedis and posterior   tibial artery pulses are normal. 5.07 monofilament testing normal. Skin: There is a small (1-2 mm) flesh-colored papule near the outer canthus of the right eye. No   telangiectasias, erythema, or surface changes. Psych: Alert and oriented. Normal mood and affect.  Health Maintenance Due  Topic Date Due   TETANUS/TDAP  Never done   COLONOSCOPY (Pts 45-58yr Insurance coverage will need to be confirmed)  Never done   Zoster Vaccines- Shingrix (1 of 2) Never done     Assessment & Plan:   1. Type 2 diabetes mellitus with stage 3a chronic kidney disease, without long-term current use of insulin (HCC) Doing well on metformin 1000 mg daily. Will follow A1c.  - Glucose, random - Hemoglobin A1c  2. Essential hypertension Blood pressure is adequately controlled on HCTZ 12.5 mg daily, metoprolol XL 100 mg daily, verapamil 240 mg daily, and valsartan 320 mg daily.   3. Chronic kidney disease, stage 3a ([place] Blood pressure control is adequate.  4. Mixed hyperlipidemia Continue lovastatin 40 mg daily. I will rep[eat lipids today. - Lipid panel  5. Benign mole The bump near the eye appears to be a benign mole. We will monitor this.  6. Need for Td vaccine  - Td : Tetanus/diphtheria >7yo  Preservative  free  Return in about 3 months (around 04/18/2022) for Reassessment.   SHaydee Salter MD

## 2022-01-17 ENCOUNTER — Telehealth: Payer: Self-pay | Admitting: Family Medicine

## 2022-01-17 NOTE — Telephone Encounter (Signed)
Pt called you back to day at 4:10pm about lab results

## 2022-01-18 NOTE — Telephone Encounter (Signed)
Patient notified VIA phone. Dm/cma  

## 2022-01-31 ENCOUNTER — Other Ambulatory Visit: Payer: Self-pay | Admitting: Cardiology

## 2022-01-31 ENCOUNTER — Other Ambulatory Visit: Payer: Self-pay | Admitting: Family Medicine

## 2022-01-31 DIAGNOSIS — M1611 Unilateral primary osteoarthritis, right hip: Secondary | ICD-10-CM

## 2022-01-31 MED ORDER — HYDROCODONE-ACETAMINOPHEN 5-325 MG PO TABS: ORAL_TABLET | ORAL | 0 refills | Status: DC

## 2022-01-31 NOTE — Telephone Encounter (Signed)
Refill request for  Hydrocodone Acet 5/325 mg  LR 11/07/21, # 90, 0 refills  LOV 01/16/22 FOV 04/17/22  Please review and advise.  Thanks. Dm/cma

## 2022-01-31 NOTE — Telephone Encounter (Signed)
  Encourage patient to contact the pharmacy for refills or they can request refills through [place]:  Please schedule appointment if longer than 1 year  NEXT APPOINTMENT DATE:  MEDICATION: HYDROcodone-acetaminophen (NORCO) 5-325 MG tablet  Is the patient out of medication?   [facility]ACY[id number] [place] [place] - [address] Phone:  [phone]     Let patient know to contact pharmacy at the end of the day to make sure medication is ready.  Please notify patient to allow 48-72 hours to process  CLINICAL FILLS OUT ALL BELOW:   LAST REFILL:  QTY:  REFILL DATE:    OTHER COMMENTS:    Okay for refill?  Please advise

## 2022-01-31 NOTE — Telephone Encounter (Signed)
Patient notified VIA phone. Dm/cma  

## 2022-04-10 ENCOUNTER — Telehealth: Payer: Self-pay | Admitting: Family Medicine

## 2022-04-10 DIAGNOSIS — I10 Essential (primary) hypertension: Secondary | ICD-10-CM

## 2022-04-10 MED ORDER — VALSARTAN 320 MG PO TABS: 320.0000 mg | ORAL_TABLET | Freq: Every day | ORAL | 1 refills | Status: DC

## 2022-04-10 NOTE — Telephone Encounter (Signed)
Rx sent to the pharmacy and patient notified VIA phone.  Dm/cma  

## 2022-04-10 NOTE — Telephone Encounter (Signed)
Caller Name: Carrell Rahmani Call back phone #: [phone]   MEDICATION(S):  Valsartan '3250mg'$   Has the patient contacted their pharmacy (YES/NO)? yes What did pharmacy advise? They have not received the medication refill, asked for Dr. Gena Fray to send over  Preferred Pharmacy:  [id number] [place] [address] Phone:  [phone] Fax:  [phone]  ~~~Please advise patient/caregiver to allow 2-3 business days to process RX refills.

## 2022-04-17 ENCOUNTER — Encounter: Payer: Self-pay | Admitting: Family Medicine

## 2022-04-17 ENCOUNTER — Ambulatory Visit: Payer: No Typology Code available for payment source | Admitting: Family Medicine

## 2022-04-17 VITALS — BP 122/76 | HR 62 | Temp 98.0°F | Ht 68.0 in | Wt 191.0 lb

## 2022-04-17 DIAGNOSIS — E1122 Type 2 diabetes mellitus with diabetic chronic kidney disease: Secondary | ICD-10-CM

## 2022-04-17 DIAGNOSIS — I10 Essential (primary) hypertension: Secondary | ICD-10-CM

## 2022-04-17 DIAGNOSIS — M545 Low back pain, unspecified: Secondary | ICD-10-CM | POA: Diagnosis not present

## 2022-04-17 DIAGNOSIS — G8929 Other chronic pain: Secondary | ICD-10-CM

## 2022-04-17 DIAGNOSIS — G5693 Unspecified mononeuropathy of bilateral upper limbs: Secondary | ICD-10-CM

## 2022-04-17 DIAGNOSIS — N1831 Chronic kidney disease, stage 3a: Secondary | ICD-10-CM | POA: Diagnosis not present

## 2022-04-17 DIAGNOSIS — Z5181 Encounter for therapeutic drug level monitoring: Secondary | ICD-10-CM

## 2022-04-17 LAB — CBC
HCT: 47.1 % (ref 39.0–52.0)
Hemoglobin: 15.6 g/dL (ref 13.0–17.0)
MCHC: 33.1 g/dL (ref 30.0–36.0)
MCV: 101.5 fl — ABNORMAL HIGH (ref 78.0–100.0)
Platelets: 212 10*3/uL (ref 150.0–400.0)
RBC: 4.64 Mil/uL (ref 4.22–5.81)
RDW: 13.9 % (ref 11.5–15.5)
WBC: 10.1 10*3/uL (ref 4.0–10.5)

## 2022-04-17 LAB — GLUCOSE, RANDOM: Glucose, Bld: 97 mg/dL (ref 70–99)

## 2022-04-17 LAB — HEMOGLOBIN A1C: Hgb A1c MFr Bld: 5.9 % (ref 4.6–6.5)

## 2022-04-17 MED ORDER — GABAPENTIN 300 MG PO CAPS: 300.0000 mg | ORAL_CAPSULE | Freq: Two times a day (BID) | ORAL | 3 refills | Status: DC

## 2022-04-17 NOTE — Progress Notes (Signed)
[facility] [address] [place] [address] Dept: [phone] Dept Fax: [phone]  Chronic Care Office Visit  Subjective:    Patient ID: Gavin Hopkins, male    DOB: 12-May-1956, 66 y.o..   MRN: [id number]  Chief Complaint  Patient presents with   Follow-up    3 month f/u.   No concerns.   Declines flu shot.     History of Present Illness:  Patient is in today for reassessment of chronic medical issues.  Mr. Schmieder has a history of Type 2 diabetes. He is managed on metformin 500 mg, two tabs daily. He has had 17 lbs of weight loss since December. He has some neuropathy of both feet. He notes he has reduced his gabapentin to 300 mg bid, as he was told to avoid taking it and the verapamil at the same time.   Mr. Yera has a history of hyperlipidemia. He is managed on lovastatin 40 mg daily.   Mr. Foutz has a history of hypertension. He is managed on HCTZ 12.5 mg daily, metoprolol XL 100 mg daily, verapamil 240 mg daily, and valsartan 320 mg daily. The beta-blockers also are helping to managed an issue with atrial tachycardia. He has not noted any palpitations.   Past Medical History: Patient Active Problem List   Diagnosis Date Noted   Chronic kidney disease, stage 3a ([place]) 10/10/2021   Palpitations 04/29/2021   RBBB 04/29/2021   Aortic root dilatation ([place]) 04/29/2021   Overweight (BMI 25.0-29.9) 01/08/2021   Tobacco abuse 02/14/2020   Hyperlipidemia 02/14/2020   Chronic midline low back pain without sciatica 10/12/2019   Osteoarthritis 10/12/2019   Hepatic steatosis 08/19/2019   Alcohol abuse 08/19/2019   Essential hypertension 06/28/2019   Type 2 diabetes mellitus with stage 3a chronic kidney disease, without long-term current use of insulin ([place]) 06/28/2019   Atrial tachycardia ([place]) 06/28/2019   Chronic prescription opiate use 06/28/2019   Neuropathy of both upper extremities 06/28/2019    History of revision of total replacement of right hip joint 06/22/2019   Primary osteoarthritis of left hip 06/07/2019   Past Surgical History:  Procedure Laterality Date   FRACTURE SURGERY     LEG SURGERY Right    over 30 yrs ago   NO PAST SURGERIES     TONSILLECTOMY     TOTAL HIP ARTHROPLASTY Right 06/07/2019   Procedure: TOTAL HIP ARTHROPLASTY ANTERIOR APPROACH;  Surgeon: Gaynelle Arabian, MD;  Location: [facility];  Service: Orthopedics;  Laterality: Right;  137mn   Family History  Problem Relation Age of Onset   Hypertension Mother    Hyperlipidemia Mother    Diabetes Mother    Heart attack Mother        stent   Stroke Mother    Heart attack Brother    Diabetes Brother    Cancer Maternal Uncle        Pancreatic   Heart attack Maternal Grandmother    Heart attack Maternal Grandfather    Outpatient Medications Prior to Visit  Medication Sig Dispense Refill   Aromatic Inhalants (VICKS BABYRUB) OINT Apply 1 application topically at bedtime.      aspirin EC 81 MG tablet Take 81 mg by mouth daily. Swallow whole.     gabapentin (NEURONTIN) 300 MG capsule TAKE 1 CAPSULE BY MOUTH THREE TIMES A DAY 270 capsule 3   hydrochlorothiazide (HYDRODIURIL) 12.5 MG tablet TAKE 1 TABLET BY MOUTH EVERY DAY 90 tablet 3   HYDROcodone-acetaminophen (NORCO)  5-325 MG tablet May take one daily prior to work 90 tablet 0   lovastatin (MEVACOR) 40 MG tablet TAKE 1 TABLET BY MOUTH EVERYDAY AT BEDTIME 90 tablet 3   metFORMIN (GLUCOPHAGE) 500 MG tablet TAKE 2 TABLETS (1,000 MG TOTAL) BY MOUTH DAILY IN THE AFTERNOON. 180 tablet 1   metoprolol succinate (TOPROL-XL) 100 MG 24 hr tablet TAKE 1 TABLET BY MOUTH EVERY DAY 90 tablet 1   Multiple Vitamin (MULTIVITAMIN WITH MINERALS) TABS tablet Take 1 tablet by mouth daily. Centrum Silver for Men 50+     valsartan (DIOVAN) 320 MG tablet Take 1 tablet (320 mg total) by mouth daily. 90 tablet 1   verapamil (CALAN-SR) 240 MG CR tablet TAKE 1 TABLET BY MOUTH EVERYDAY AT  BEDTIME 90 tablet 0   No facility-administered medications prior to visit.   No Known Allergies    Objective:   Today's Vitals   04/17/22 1028  BP: 122/76  Pulse: 62  Temp: 98 F (36.7 C)  TempSrc: Temporal  SpO2: 93%  Weight: 191 lb (86.6 kg)  Height: '5\' 8"'$  (1.727 m)   Body mass index is 29.04 kg/m.   General: Well developed, well nourished. No acute distress. Psych: Alert and oriented. Normal mood and affect.  Health Maintenance Due  Topic Date Due   Zoster Vaccines- Shingrix (1 of 2) Never done   COLONOSCOPY (Pts 45-37yr Insurance coverage will need to be confirmed)  Never done   Lab Results Last hemoglobin A1c Lab Results  Component Value Date   HGBA1C 6.0 01/16/2022     Assessment & Plan:   1. Type 2 diabetes mellitus with stage 3a chronic kidney disease, without long-term current use of insulin (HCC) Diabetes has been in good control. The weight loss likely has helped with this. Continue metformin 1,000 mg daily.  - Glucose, random - Hemoglobin A1c  2. Essential hypertension Blood pressure is at goal. Continue HCTZ 12.5 mg daily, metoprolol XL 100 mg daily, verapamil 240 mg daily, and valsartan 320 mg daily.  3. Neuropathy of both upper extremities Stable on gabapentin twice a day.  - gabapentin (NEURONTIN) 300 MG capsule; Take 1 capsule (300 mg total) by mouth 2 (two) times daily.  Dispense: 180 capsule; Refill: 3  4. Chronic midline low back pain without sciatica Stable on gabapentin and once daily Vicodin.  - gabapentin (NEURONTIN) 300 MG capsule; Take 1 capsule (300 mg total) by mouth 2 (two) times daily.  Dispense: 180 capsule; Refill: 3  5. Encounter for medication monitoring Mr. QFurticknotes some recent bruising. This likely relates to his aspirin use, but I will check a CBC to make sure his platelets are normal. Also, he is on chronic metformin which can be associated with Vit. B12 deficiency, so will screen for anemia.  - CBC  Return  in about 4 months (around 08/17/2022) for Reassessment.   SHaydee Salter MD

## 2022-04-28 ENCOUNTER — Other Ambulatory Visit: Payer: Self-pay | Admitting: Family Medicine

## 2022-04-30 ENCOUNTER — Other Ambulatory Visit: Payer: Self-pay | Admitting: Family Medicine

## 2022-04-30 DIAGNOSIS — M1611 Unilateral primary osteoarthritis, right hip: Secondary | ICD-10-CM

## 2022-04-30 NOTE — Telephone Encounter (Signed)
Caller Name: Xyler Terpening Call back phone #: [phone]   MEDICATION(S):  Hydrocodone-acetaminophen  Days of Med Remaining: Until Sunday  Please call in  [facility]erred [facility]acy[facility]  [id number] [place] [address] Phone:  [phone] Fax:  [phone]  ~~~Please advise patient/caregiver to allow 2-3 business days to process RX refills.

## 2022-05-02 MED ORDER — HYDROCODONE-ACETAMINOPHEN 5-325 MG PO TABS: ORAL_TABLET | ORAL | 0 refills | Status: DC

## 2022-05-02 NOTE — Telephone Encounter (Signed)
Patient notified VIA phone. Dm/cma  

## 2022-05-02 NOTE — Telephone Encounter (Signed)
Refill request for  Hydrocodone/Acet 5/325 mg LR 01/31/22, #90, 0 fr LOV 04/17/22 FOV 08/21/22   Please review and advise.  Thanks. Dm/cma

## 2022-05-29 ENCOUNTER — Ambulatory Visit: Payer: No Typology Code available for payment source | Admitting: Cardiology

## 2022-06-06 LAB — HM DIABETES EYE EXAM

## 2022-06-07 ENCOUNTER — Encounter: Payer: Self-pay | Admitting: Family Medicine

## 2022-06-10 NOTE — Progress Notes (Unsigned)
Cardiology Office Note   Date:  06/12/2022   ID:  Izzak Fries, DOB 04/09/1956, MRN [id number]  PCP:  Haydee Salter, MD  Cardiologist:   Minus Breeding, MD   Chief Complaint  Patient presents with   PVCs       History of Present Illness: Gavin Hopkins is a 66 y.o. male who was referred by himself for evaluation of atrial tachycardia.    He has had PVCs and RBBB.  Oct 2020 he had a negative ischemia work up.  He had an unremarkable echo last year.    Since I last saw him he has done okay.  He denies any cardiovascular symptoms.  He has had no chest pressure, neck or arm discomfort.  He has had no shortness of breath, PND or orthopnea.  He does not notice any palpitations, presyncope or syncope.  He has had no weight gain or edema.   Past Medical History:  Diagnosis Date   Arthritis    oa   Diabetes mellitus without complication ([place])    type 2   Displaced fracture of lateral malleolus of right fibula, initial encounter for closed fracture 30 yrs ago   and tibula, full cast applied   Hypertension    Sleep apnea    could not tolerate cpap    Past Surgical History:  Procedure Laterality Date   FRACTURE SURGERY     LEG SURGERY Right    over 30 yrs ago   NO PAST SURGERIES     TONSILLECTOMY     TOTAL HIP ARTHROPLASTY Right 06/07/2019   Procedure: TOTAL HIP ARTHROPLASTY ANTERIOR APPROACH;  Surgeon: Gaynelle Arabian, MD;  Location: [facility];  Service: Orthopedics;  Laterality: Right;  146mn     Current Outpatient Medications  Medication Sig Dispense Refill   Aromatic Inhalants (VICKS BABYRUB) OINT Apply 1 application topically at bedtime.      aspirin EC 81 MG tablet Take 81 mg by mouth daily. Swallow whole.     gabapentin (NEURONTIN) 300 MG capsule Take 1 capsule (300 mg total) by mouth 2 (two) times daily. 180 capsule 3   hydrochlorothiazide (HYDRODIURIL) 12.5 MG tablet TAKE 1 TABLET BY MOUTH EVERY DAY 90 tablet 3   HYDROcodone-acetaminophen (NORCO) 5-325 MG  tablet May take one daily prior to work 90 tablet 0   lovastatin (MEVACOR) 40 MG tablet TAKE 1 TABLET BY MOUTH EVERYDAY AT BEDTIME 90 tablet 3   metFORMIN (GLUCOPHAGE) 500 MG tablet TAKE 2 TABLETS (1,000 MG TOTAL) BY MOUTH DAILY IN THE AFTERNOON. 180 tablet 1   metoprolol succinate (TOPROL-XL) 100 MG 24 hr tablet TAKE 1 TABLET BY MOUTH EVERY DAY 90 tablet 1   Multiple Vitamin (MULTIVITAMIN WITH MINERALS) TABS tablet Take 1 tablet by mouth daily. Centrum Silver for Men 50+     valsartan (DIOVAN) 320 MG tablet Take 1 tablet (320 mg total) by mouth daily. 90 tablet 1   verapamil (CALAN-SR) 240 MG CR tablet TAKE 1 TABLET BY MOUTH EVERYDAY AT BEDTIME 90 tablet 3   No current facility-administered medications for this visit.    Allergies:   Patient has no known allergies.    ROS:  Please see the history of present illness.   Otherwise, review of systems are positive for none.   All other systems are reviewed and negative.    PHYSICAL EXAM: VS:  BP 132/78 (BP Location: Left Arm, Patient Position: Sitting, Cuff Size: Normal)   Pulse 75   Ht '5\' 8"'$  (1.727 m)  Wt 188 lb 6.4 oz (85.5 kg)   SpO2 95%   BMI 28.65 kg/m  , BMI Body mass index is 28.65 kg/m. GENERAL:  Well appearing NECK:  No jugular venous distention, waveform within normal limits, carotid upstroke brisk and symmetric, no bruits, no thyromegaly LUNGS:  Clear to auscultation bilaterally CHEST:  Unremarkable HEART:  PMI not displaced or sustained,S1 and S2 within normal limits, no S3, no S4, no clicks, no rubs, no murmurs ABD:  Flat, positive bowel sounds normal in frequency in pitch, no bruits, no rebound, no guarding, no midline pulsatile mass, no hepatomegaly, no splenomegaly EXT:  2 plus pulses throughout, no edema, no cyanosis no clubbing    EKG:  EKG is  ordered today. The ekg ordered today demonstrates sinus rhythm, rate 75, right bundle branch block left axis deviation, left anterior fascicular block borderline, no acute  ST-T wave changes.  No change from previous, PA-C   Recent Labs: 07/11/2021: BUN 16; Creatinine, Ser 1.21; Potassium 4.5; Sodium 138 04/17/2022: Hemoglobin 15.6; Platelets 212.0    Lipid Panel    Component Value Date/Time   CHOL 156 01/16/2022 1145   TRIG 119.0 01/16/2022 1145   HDL 60.70 01/16/2022 1145   CHOLHDL 3 01/16/2022 1145   VLDL 23.8 01/16/2022 1145   LDLCALC 72 01/16/2022 1145   LDLDIRECT 89.0 10/10/2020 0855      Wt Readings from Last 3 Encounters:  06/12/22 188 lb 6.4 oz (85.5 kg)  04/17/22 191 lb (86.6 kg)  01/16/22 195 lb 3.2 oz (88.5 kg)      Other studies Reviewed: Additional studies/ records that were reviewed today include: Labs Review of the above records demonstrates:  Please see elsewhere in the note.     ASSESSMENT AND PLAN:  PVCs:    He has had no palpitations.  No change in therapy.  RBBB:   This is chronic.   He has had no presyncope or syncope.  We talked about this and he would let me know if that happens in the future.  AORTIC ROOT DILATATION: I will order a contrasted CT to follow-up on this.  HTN:   His blood pressure is well controlled.  No change in therapy.   DM: A1c was 5.9.  This is better than previous.  No change in therapy.   DYSLIPIDEMIA:     72 with an HDL of 60.  No change in therapy.  TOBACCO: We talked about the need to stop smoking.  He does not think he can do this.  AAA SCREENING: He is a male greater than 55 years old smoker history and will get screening for AAA.   Current medicines are reviewed at length with the patient today.  The patient does not have concerns regarding medicines.  The following changes have been made:   None  Labs/ tests ordered today include:        Orders Placed This Encounter  Procedures   CT ANGIO CHEST AORTA W/CM & OR WO/CM   Basic metabolic panel   EKG 91-PHXT   VAS [place] AAA DUPLEX     Disposition:   FU with me in in two years   Signed, Minus Breeding, MD  06/12/2022 2:33  PM    [place]

## 2022-06-12 ENCOUNTER — Ambulatory Visit: Payer: No Typology Code available for payment source | Attending: Cardiology | Admitting: Cardiology

## 2022-06-12 ENCOUNTER — Encounter: Payer: Self-pay | Admitting: Cardiology

## 2022-06-12 VITALS — BP 132/78 | HR 75 | Ht 68.0 in | Wt 188.4 lb

## 2022-06-12 DIAGNOSIS — E785 Hyperlipidemia, unspecified: Secondary | ICD-10-CM

## 2022-06-12 DIAGNOSIS — E1165 Type 2 diabetes mellitus with hyperglycemia: Secondary | ICD-10-CM

## 2022-06-12 DIAGNOSIS — I4510 Unspecified right bundle-branch block: Secondary | ICD-10-CM

## 2022-06-12 DIAGNOSIS — I77810 Thoracic aortic ectasia: Secondary | ICD-10-CM

## 2022-06-12 DIAGNOSIS — I10 Essential (primary) hypertension: Secondary | ICD-10-CM | POA: Diagnosis not present

## 2022-06-12 DIAGNOSIS — I493 Ventricular premature depolarization: Secondary | ICD-10-CM | POA: Diagnosis not present

## 2022-06-12 DIAGNOSIS — Z794 Long term (current) use of insulin: Secondary | ICD-10-CM

## 2022-06-12 NOTE — Patient Instructions (Signed)
Medication Instructions:   No  changes    *If you need a refill on your cardiac medications before your next appointment, please call your pharmacy*   Lab Work:  BMP  at least  7 days prior to CT of aorta    If you have labs (blood work) drawn today and your tests are completely normal, you will receive your results only by: [place] (if you have MyChart) OR A paper copy in the mail If you have any lab test that is abnormal or we need to change your treatment, we will call you to review the results.   Testing/Procedures:  Will be schedule at [facility] has requested that you have an abdominal aorta duplex. During this test, an ultrasound is used to evaluate the aorta. Allow 30 minutes for this exam. Do not eat after midnight the day before and avoid carbonated beverages     Will be schedule at hospital [address]   CT  chest  and Angiography (CTA)- aorta , is a special type of CT scan that uses a computer to produce multi-dimensional views of major blood vessels throughout the body. In CT angiography, a contrast material is injected through an IV to help visualize the blood vessels - aorta  Follow-Up: At [facility] you and your health needs are our priority.  As part of our continuing mission to provide you with exceptional heart care, we have created designated Provider Care Teams.  These Care Teams include your primary Cardiologist (physician) and Advanced Practice Providers (APPs -  Physician Assistants and Nurse Practitioners) who all work together to provide you with the care you need, when you need it.  We recommend signing up for the patient portal called "MyChart".  Sign up information is provided on this After Visit Summary.  MyChart is used to connect with patients for Virtual Visits (Telemedicine).  Patients are able to view lab/test results, encounter notes, upcoming appointments, etc.  Non-urgent messages can be  sent to your provider as well.   To learn more about what you can do with MyChart, go to [email or web address].    Your next appointment:   24 month(s)  The format for your next appointment:   In Person  Provider:   Minus Breeding, MD

## 2022-06-21 ENCOUNTER — Other Ambulatory Visit: Payer: Self-pay | Admitting: Cardiology

## 2022-06-21 ENCOUNTER — Other Ambulatory Visit: Payer: Self-pay | Admitting: Family Medicine

## 2022-07-10 ENCOUNTER — Ambulatory Visit (HOSPITAL_COMMUNITY)
Admission: RE | Admit: 2022-07-10 | Discharge: 2022-07-10 | Disposition: A | Discharge status: Home / Self Care | Payer: No Typology Code available for payment source | Source: Ambulatory Visit | Attending: Cardiovascular Disease | Admitting: Cardiovascular Disease

## 2022-07-10 DIAGNOSIS — I77810 Thoracic aortic ectasia: Secondary | ICD-10-CM | POA: Diagnosis present

## 2022-07-15 ENCOUNTER — Encounter: Payer: Self-pay | Admitting: *Deleted

## 2022-07-23 ENCOUNTER — Telehealth: Payer: Self-pay | Admitting: Family Medicine

## 2022-07-23 DIAGNOSIS — M1611 Unilateral primary osteoarthritis, right hip: Secondary | ICD-10-CM

## 2022-07-23 MED ORDER — HYDROCHLOROTHIAZIDE 12.5 MG PO TABS: 12.5000 mg | ORAL_TABLET | Freq: Every day | ORAL | 3 refills | Status: DC

## 2022-07-23 MED ORDER — HYDROCODONE-ACETAMINOPHEN 5-325 MG PO TABS: ORAL_TABLET | ORAL | 0 refills | Status: DC

## 2022-07-23 NOTE — Telephone Encounter (Signed)
Caller Name: Theophil Thivierge Call back phone #: [phone]   MEDICATION(S):  Hydrochlorothiazide, hydrocodone-acetaminophen  Days of Med Remaining:   Has the patient contacted their pharmacy (YES/NO)? Yes, they sent over request What did pharmacy advise?   [facility]  [id number] [place] [place] - [address] [id number]: [phone] Fax: [phone]  ~~~Please advise patient/caregiver to allow 2-3 business days to process RX refills.

## 2022-07-23 NOTE — Telephone Encounter (Signed)
Patient notified VIA phone. Dm/cma  

## 2022-07-23 NOTE — Telephone Encounter (Signed)
Refill request for  Hydrocodone/Acte 5/325 mg LR 05/02/22, #90, 0 rf  Hydrochlorothiazide 12.5 mg LR 07/24/22, #90, 3 rf LOV 04/17/22 FOV 08/21/22  Please review and advise.  Thanks Dm/cma

## 2022-07-30 ENCOUNTER — Other Ambulatory Visit: Payer: Self-pay | Admitting: Family Medicine

## 2022-07-30 DIAGNOSIS — I10 Essential (primary) hypertension: Secondary | ICD-10-CM

## 2022-07-31 ENCOUNTER — Telehealth: Payer: Self-pay

## 2022-07-31 ENCOUNTER — Other Ambulatory Visit (HOSPITAL_COMMUNITY): Payer: Self-pay

## 2022-07-31 NOTE — Telephone Encounter (Signed)
Patient Advocate Encounter  Prior Authorization for Hydrocodone/APAP 5/'325mg'$  has been approved.    PA# PA Case ID: [id number] Effective dates: 07/31/22 through 07/29/23  [facility] to notify of the approval.

## 2022-07-31 NOTE — Telephone Encounter (Signed)
Patient notified VIA phone. Dm/cma  

## 2022-07-31 NOTE — Telephone Encounter (Signed)
Pt says his pharmacy has sent over a PA to be filled out. He just wants to double check that we have gotten it. It's for HYDROcodone-acetaminophen (NORCO) 5-325 MG tablet [[id number]]

## 2022-07-31 NOTE — Telephone Encounter (Signed)
Prior auth for the Hydrocodone/APAP 5/'325mg'$  has been approved.   Placed a call to pharmacy to notify of the approval.

## 2022-07-31 NOTE — Telephone Encounter (Signed)
Pharmacy Patient Advocate Encounter   Received notification from [facility] that prior authorization for Hydrocodone/APAP 5/'325mg'$  is required/requested.    PA submitted on 07/31/22 to (ins) [employer] via CoverMyMeds [place] [id number] Status is pending

## 2022-08-20 ENCOUNTER — Other Ambulatory Visit: Payer: Self-pay | Admitting: Family Medicine

## 2022-08-21 ENCOUNTER — Encounter: Payer: Self-pay | Admitting: Family Medicine

## 2022-08-21 ENCOUNTER — Ambulatory Visit (INDEPENDENT_AMBULATORY_CARE_PROVIDER_SITE_OTHER): Payer: Medicare HMO | Admitting: Family Medicine

## 2022-08-21 VITALS — BP 132/84 | HR 59 | Temp 97.0°F | Ht 68.0 in | Wt 188.2 lb

## 2022-08-21 DIAGNOSIS — I10 Essential (primary) hypertension: Secondary | ICD-10-CM

## 2022-08-21 DIAGNOSIS — E782 Mixed hyperlipidemia: Secondary | ICD-10-CM | POA: Diagnosis not present

## 2022-08-21 DIAGNOSIS — Z1211 Encounter for screening for malignant neoplasm of colon: Secondary | ICD-10-CM

## 2022-08-21 DIAGNOSIS — E1122 Type 2 diabetes mellitus with diabetic chronic kidney disease: Secondary | ICD-10-CM

## 2022-08-21 DIAGNOSIS — N1831 Chronic kidney disease, stage 3a: Secondary | ICD-10-CM | POA: Diagnosis not present

## 2022-08-21 LAB — LIPID PANEL
Cholesterol: 154 mg/dL (ref 0–200)
HDL: 72.2 mg/dL (ref >39.00)
LDL Cholesterol: 71 mg/dL (ref 0–99)
NonHDL: 81.39
Total CHOL/HDL Ratio: 2
Triglycerides: 50 mg/dL (ref 0.0–149.0)
VLDL: 10 mg/dL (ref 0.0–40.0)

## 2022-08-21 LAB — BASIC METABOLIC PANEL
BUN: 26 mg/dL — ABNORMAL HIGH (ref 6–23)
CO2: 29 mEq/L (ref 19–32)
Calcium: 9.1 mg/dL (ref 8.4–10.5)
Chloride: 102 mEq/L (ref 96–112)
Creatinine, Ser: 1.33 mg/dL (ref 0.40–1.50)
GFR: 54.75 mL/min — ABNORMAL LOW (ref >60.00)
Glucose, Bld: 108 mg/dL — ABNORMAL HIGH (ref 70–99)
Potassium: 4.3 mEq/L (ref 3.5–5.1)
Sodium: 140 mEq/L (ref 135–145)

## 2022-08-21 LAB — URINALYSIS, ROUTINE W REFLEX MICROSCOPIC
Bilirubin Urine: NEGATIVE
Hgb urine dipstick: NEGATIVE
Ketones, ur: NEGATIVE
Leukocytes,Ua: NEGATIVE
Nitrite: NEGATIVE
Specific Gravity, Urine: 1.025 (ref 1.000–1.030)
Total Protein, Urine: 30 — AB
Urine Glucose: NEGATIVE
Urobilinogen, UA: 0.2 (ref 0.0–1.0)
pH: 6.5 (ref 5.0–8.0)

## 2022-08-21 LAB — HEMOGLOBIN A1C: Hgb A1c MFr Bld: 5.8 % (ref 4.6–6.5)

## 2022-08-21 LAB — MICROALBUMIN / CREATININE URINE RATIO
Creatinine,U: 176.3 mg/dL
Microalb Creat Ratio: 18.6 mg/g (ref 0.0–30.0)
Microalb, Ur: 32.8 mg/dL — ABNORMAL HIGH (ref 0.0–1.9)

## 2022-08-21 MED ORDER — HYDROCHLOROTHIAZIDE 25 MG PO TABS: 25.0000 mg | ORAL_TABLET | Freq: Every day | ORAL | 3 refills | Status: DC

## 2022-08-21 NOTE — Progress Notes (Signed)
[facility] [address] [place] [address] Dept: [phone] Dept Fax: [phone]  Chronic Care Office Visit  Subjective:    Patient ID: Gavin Hopkins, male    DOB: January 16, 1956, 67 y.o..   MRN: [id number]  Chief Complaint  Patient presents with   Follow-up    4 month f/u.  No concerns. Fasting today.      History of Present Illness:  Patient is in today for reassessment of chronic medical issues.  Gavin Hopkins has a history of Type 2 diabetes. He is managed on metformin 500 mg, two tabs daily. He has some neuropathy of both feet. He is managed on gabapentin 300 mg bid.   Gavin Hopkins has a history of hyperlipidemia. He is managed on lovastatin 40 mg daily.   Gavin Hopkins has a history of hypertension. He is managed on HCTZ 12.5 mg daily, metoprolol XL 100 mg daily, verapamil 240 mg daily, and valsartan 320 mg daily. The beta-blockers also are helping to manage an issue with atrial tachycardia. He has not noted any palpitations.  Past Medical History: Patient Active Problem List   Diagnosis Date Noted   Chronic kidney disease, stage 3a ([place]) 10/10/2021   Palpitations 04/29/2021   RBBB 04/29/2021   Aortic root dilatation ([place]) 04/29/2021   Overweight (BMI 25.0-29.9) 01/08/2021   Tobacco abuse 02/14/2020   Hyperlipidemia 02/14/2020   Chronic midline low back pain without sciatica 10/12/2019   Osteoarthritis 10/12/2019   Hepatic steatosis 08/19/2019   Alcohol abuse 08/19/2019   Essential hypertension 06/28/2019   Type 2 diabetes mellitus with stage 3a chronic kidney disease, without long-term current use of insulin ([place]) 06/28/2019   Atrial tachycardia 06/28/2019   Chronic prescription opiate use 06/28/2019   Neuropathy of both upper extremities 06/28/2019   History of revision of total replacement of right hip joint 06/22/2019   Primary osteoarthritis of left hip 06/07/2019   Past Surgical History:   Procedure Laterality Date   FRACTURE SURGERY     LEG SURGERY Right    over 30 yrs ago   NO PAST SURGERIES     TONSILLECTOMY     TOTAL HIP ARTHROPLASTY Right 06/07/2019   Procedure: TOTAL HIP ARTHROPLASTY ANTERIOR APPROACH;  Surgeon: Gaynelle Arabian, MD;  Location: [facility];  Service: Orthopedics;  Laterality: Right;  130mn   Family History  Problem Relation Age of Onset   Hypertension Mother    Hyperlipidemia Mother    Diabetes Mother    Heart attack Mother        stent   Stroke Mother    Heart attack Brother    Diabetes Brother    Cancer Maternal Uncle        Pancreatic   Heart attack Maternal Grandmother    Heart attack Maternal Grandfather     Outpatient Medications Prior to Visit  Medication Sig Dispense Refill   Aromatic Inhalants (VICKS BABYRUB) OINT Apply 1 application topically at bedtime.      aspirin EC 81 MG tablet Take 81 mg by mouth daily. Swallow whole.     gabapentin (NEURONTIN) 300 MG capsule Take 1 capsule (300 mg total) by mouth 2 (two) times daily. 180 capsule 3   HYDROcodone-acetaminophen (NORCO) 5-325 MG tablet May take one daily prior to work 90 tablet 0   lovastatin (MEVACOR) 40 MG tablet TAKE 1 TABLET BY MOUTH EVERYDAY AT BEDTIME 90 tablet 2   metFORMIN (GLUCOPHAGE) 500 MG tablet TAKE 2 TABLETS (1,000 MG TOTAL) BY MOUTH  DAILY IN THE AFTERNOON. 180 tablet 3   metoprolol succinate (TOPROL-XL) 100 MG 24 hr tablet TAKE 1 TABLET BY MOUTH EVERY DAY 90 tablet 1   Multiple Vitamin (MULTIVITAMIN WITH MINERALS) TABS tablet Take 1 tablet by mouth daily. Centrum Silver for Men 50+     valsartan (DIOVAN) 320 MG tablet TAKE 1 TABLET BY MOUTH EVERY DAY 90 tablet 3   verapamil (CALAN-SR) 240 MG CR tablet TAKE 1 TABLET BY MOUTH EVERYDAY AT BEDTIME 90 tablet 3   hydrochlorothiazide (HYDRODIURIL) 12.5 MG tablet Take 1 tablet (12.5 mg total) by mouth daily. 90 tablet 3   No facility-administered medications prior to visit.   No Known Allergies    Objective:   Today's  Vitals   08/21/22 1034  BP: 132/84  Pulse: (!) 59  Temp: (!) 97 F (36.1 C)  TempSrc: Temporal  SpO2: 97%  Weight: 188 lb 3.2 oz (85.4 kg)  Height: '5\' 8"'$  (1.727 m)   Body mass index is 28.62 kg/m.   General: Well developed, well nourished. No acute distress. Psych: Alert and oriented. Normal mood and affect.  Health Maintenance Due  Topic Date Due   Fecal DNA (Cologuard)  Never done   Diabetic kidney evaluation - eGFR measurement  07/11/2022   Diabetic kidney evaluation - Urine ACR  07/11/2022     Assessment & Plan:   Problem List Items Addressed This Visit       Cardiovascular and Mediastinum   Essential hypertension    Blood pressure remains slightly above goal. He will continue metoprolol XL 100 mg daily, verapamil 240 mg daily, and valsartan 320 mg daily. I will increase is HCTZ to 25 mg daily.      Relevant Medications   hydrochlorothiazide (HYDRODIURIL) 25 MG tablet     Endocrine   Type 2 diabetes mellitus with stage 3a chronic kidney disease, without long-term current use of insulin ([place]) - Primary    Has been meeting goals. Continue metformin 500 mg, 2 tabs daily. Up to date on screenings.      Relevant Orders   Lipid panel   Microalbumin / creatinine urine ratio   Basic metabolic panel   Hemoglobin A1c   Urinalysis, Routine w reflex microscopic     Genitourinary   Chronic kidney disease, stage 3a ([facility])    We will try and optimize BP control.        Other   Hyperlipidemia    Continue lovastatin 40 mg daily.      Relevant Medications   hydrochlorothiazide (HYDRODIURIL) 25 MG tablet   Other Visit Diagnoses     Screening for colon cancer       Relevant Orders   Cologuard      Return in about 3 months (around 11/20/2022) for Reassessment.   Haydee Salter, MD

## 2022-08-21 NOTE — Assessment & Plan Note (Signed)
Continue lovastatin 40 mg daily.

## 2022-08-21 NOTE — Assessment & Plan Note (Signed)
We will try and optimize BP control.

## 2022-08-21 NOTE — Assessment & Plan Note (Signed)
Blood pressure remains slightly above goal. He will continue metoprolol XL 100 mg daily, verapamil 240 mg daily, and valsartan 320 mg daily. I will increase is HCTZ to 25 mg daily.

## 2022-08-21 NOTE — Assessment & Plan Note (Signed)
Has been meeting goals. Continue metformin 500 mg, 2 tabs daily. Up to date on screenings.

## 2022-08-31 DIAGNOSIS — Z1211 Encounter for screening for malignant neoplasm of colon: Secondary | ICD-10-CM | POA: Diagnosis not present

## 2022-09-11 LAB — COLOGUARD: COLOGUARD: NEGATIVE

## 2022-10-31 ENCOUNTER — Other Ambulatory Visit: Payer: Self-pay | Admitting: Family Medicine

## 2022-10-31 DIAGNOSIS — M1611 Unilateral primary osteoarthritis, right hip: Secondary | ICD-10-CM

## 2022-10-31 MED ORDER — HYDROCODONE-ACETAMINOPHEN 5-325 MG PO TABS: ORAL_TABLET | ORAL | 0 refills | Status: DC

## 2022-10-31 NOTE — Telephone Encounter (Signed)
Refill request for  Hydrocodone/Acet 5/325 mg LR 07/22/23,#90, 0 rf LOV 08/21/22 FOV 11/20/22  Please review and advise.  Thanks. Dm/cma

## 2022-10-31 NOTE — Telephone Encounter (Signed)
Patient notified VIA phone. Dm/cma  

## 2022-10-31 NOTE — Telephone Encounter (Signed)
Prescription Request  10/31/2022  LOV: 08/21/2022  What is the name of the medication or equipment? HYDROcodone-acetaminophen (NORCO) 5-325 MG tablet [id number]    Have you contacted your pharmacy to request a refill? Yes, he only has three pills left  Which pharmacy would you like this sent to?  [id number] [place], [place] - [address] [place] [address] Phone: [phone] Fax: [phone]     Patient notified that their request is being sent to the clinical staff for review and that they should receive a response within 2 business days.   Please advise at Mobile [phone] (mobile)

## 2022-11-20 ENCOUNTER — Ambulatory Visit (INDEPENDENT_AMBULATORY_CARE_PROVIDER_SITE_OTHER): Payer: Medicare HMO | Admitting: Family Medicine

## 2022-11-20 ENCOUNTER — Encounter: Payer: Self-pay | Admitting: Family Medicine

## 2022-11-20 VITALS — BP 118/74 | HR 61 | Temp 97.7°F | Ht 68.0 in | Wt 184.8 lb

## 2022-11-20 DIAGNOSIS — I10 Essential (primary) hypertension: Secondary | ICD-10-CM | POA: Diagnosis not present

## 2022-11-20 DIAGNOSIS — E1122 Type 2 diabetes mellitus with diabetic chronic kidney disease: Secondary | ICD-10-CM | POA: Diagnosis not present

## 2022-11-20 DIAGNOSIS — N1831 Chronic kidney disease, stage 3a: Secondary | ICD-10-CM | POA: Diagnosis not present

## 2022-11-20 DIAGNOSIS — E782 Mixed hyperlipidemia: Secondary | ICD-10-CM

## 2022-11-20 LAB — GLUCOSE, RANDOM: Glucose, Bld: 119 mg/dL — ABNORMAL HIGH (ref 70–99)

## 2022-11-20 LAB — HEMOGLOBIN A1C: Hgb A1c MFr Bld: 5.7 % (ref 4.6–6.5)

## 2022-11-20 NOTE — Assessment & Plan Note (Signed)
Has been meeting goals. Continue metformin 500 mg, 2 tabs daily. Up to date on screenings. 

## 2022-11-20 NOTE — Assessment & Plan Note (Signed)
Continue lovastatin 40 mg daily. 

## 2022-11-20 NOTE — Progress Notes (Signed)
[facility] [address] [place] [address] Dept: [phone] Dept Fax: [phone]  Chronic Care Office Visit  Subjective:    Patient ID: Gavin Hopkins, male    DOB: 11-Aug-1955, 67 y.o..   MRN: [id number]  Chief Complaint  Patient presents with   Medical Management of Chronic Issues    3 month f/u.  No concerns.  Fasting today.     History of Present Illness:  Patient is in today for reassessment of chronic medical issues.  Mr. Zeidman has a history of Type 2 diabetes. He is managed on metformin 500 mg, two tabs daily. He has some neuropathy of both feet. He is managed on gabapentin 300 mg bid.   Mr. Meunier has a history of hyperlipidemia. He is managed on lovastatin 40 mg daily.   Mr. Monarch has a history of hypertension. He is managed on HCTZ 25 mg daily (increased at last visit), metoprolol XL 100 mg daily, verapamil 240 mg daily, and valsartan 320 mg daily. The beta-blockers also are helping to manage an issue with atrial tachycardia. He has not noted any palpitations.  Past Medical History: Patient Active Problem List   Diagnosis Date Noted   Chronic kidney disease, stage 3a 10/10/2021   Palpitations 04/29/2021   RBBB 04/29/2021   Aortic root dilatation 04/29/2021   Overweight (BMI 25.0-29.9) 01/08/2021   Tobacco abuse 02/14/2020   Hyperlipidemia 02/14/2020   Chronic midline low back pain without sciatica 10/12/2019   Osteoarthritis 10/12/2019   Hepatic steatosis 08/19/2019   Alcohol abuse 08/19/2019   Essential hypertension 06/28/2019   Type 2 diabetes mellitus with stage 3a chronic kidney disease, without long-term current use of insulin 06/28/2019   Atrial tachycardia 06/28/2019   Chronic prescription opiate use 06/28/2019   Neuropathy of both upper extremities 06/28/2019   History of revision of total replacement of right hip joint 06/22/2019   Primary osteoarthritis of left hip  06/07/2019   Past Surgical History:  Procedure Laterality Date   FRACTURE SURGERY     LEG SURGERY Right    over 30 yrs ago   NO PAST SURGERIES     TONSILLECTOMY     TOTAL HIP ARTHROPLASTY Right 06/07/2019   Procedure: TOTAL HIP ARTHROPLASTY ANTERIOR APPROACH;  Surgeon: Ollen Gross, MD;  Location: [facility];  Service: Orthopedics;  Laterality: Right;    Family History  Problem Relation Age of Onset   Hypertension Mother    Hyperlipidemia Mother    Diabetes Mother    Heart attack Mother        stent   Stroke Mother    Heart attack Brother    Diabetes Brother    Cancer Maternal Uncle        Pancreatic   Heart attack Maternal Grandmother    Heart attack Maternal Grandfather    Outpatient Medications Prior to Visit  Medication Sig Dispense Refill   Aromatic Inhalants (VICKS BABYRUB) OINT Apply 1 application topically at bedtime.      aspirin EC 81 MG tablet Take 81 mg by mouth daily. Swallow whole.     gabapentin (NEURONTIN) 300 MG capsule Take 1 capsule (300 mg total) by mouth 2 (two) times daily. 180 capsule 3   hydrochlorothiazide (HYDRODIURIL) 25 MG tablet Take 1 tablet (25 mg total) by mouth daily. 90 tablet 3   HYDROcodone-acetaminophen (NORCO) 5-325 MG tablet May take one daily prior to work 90 tablet 0   lovastatin (MEVACOR) 40 MG tablet TAKE 1 TABLET  BY MOUTH EVERYDAY AT BEDTIME 90 tablet 2   metFORMIN (GLUCOPHAGE) 500 MG tablet TAKE 2 TABLETS (1,000 MG TOTAL) BY MOUTH DAILY IN THE AFTERNOON. 180 tablet 3   metoprolol succinate (TOPROL-XL) 100 MG 24 hr tablet TAKE 1 TABLET BY MOUTH EVERY DAY 90 tablet 1   Multiple Vitamin (MULTIVITAMIN WITH MINERALS) TABS tablet Take 1 tablet by mouth daily. Centrum Silver for Men 50+     valsartan (DIOVAN) 320 MG tablet TAKE 1 TABLET BY MOUTH EVERY DAY 90 tablet 3   verapamil (CALAN-SR) 240 MG CR tablet TAKE 1 TABLET BY MOUTH EVERYDAY AT BEDTIME 90 tablet 3   No facility-administered medications prior to visit.   No Known  Allergies Objective:   Today's Vitals   11/20/22 0954  BP: 118/74  Pulse: 61  Temp: 97.7 F (36.5 C)  TempSrc: Temporal  SpO2: 97%  Weight: 184 lb 12.8 oz (83.8 kg)  Height:  (1.727 m)   Body mass index is 28.1 kg/m.   General: Well developed, well nourished. No acute distress. Psych: Alert and oriented. Normal mood and affect.  There are no preventive care reminders to display for this patient.    Assessment & Plan:   Problem List Items Addressed This Visit       Cardiovascular and Mediastinum   Essential hypertension - Primary    Blood pressure is now at goal. Continue metoprolol XL 100 mg daily, verapamil 240 mg daily, valsartan 320 mg daily, and HCTZ to 25 mg daily.        Endocrine   Type 2 diabetes mellitus with stage 3a chronic kidney disease, without long-term current use of insulin    Has been meeting goals. Continue metformin 500 mg, 2 tabs daily. Up to date on screenings.      Relevant Orders   Glucose, random   Hemoglobin A1c     Other   Hyperlipidemia    Continue lovastatin 40 mg daily.       Return in about 3 months (around 02/19/2023) for Reassessment.   Loyola Mast, MD

## 2022-11-20 NOTE — Assessment & Plan Note (Signed)
Blood pressure is now at goal. Continue metoprolol XL 100 mg daily, verapamil 240 mg daily, valsartan 320 mg daily, and HCTZ to 25 mg daily.

## 2023-01-15 ENCOUNTER — Telehealth: Payer: Self-pay | Admitting: Family Medicine

## 2023-01-15 DIAGNOSIS — I10 Essential (primary) hypertension: Secondary | ICD-10-CM

## 2023-01-15 MED ORDER — METOPROLOL SUCCINATE ER 100 MG PO TB24
100.0000 mg | ORAL_TABLET | Freq: Every day | ORAL | 1 refills | Status: DC
Start: 2023-01-15 — End: 2023-06-30

## 2023-01-15 NOTE — Telephone Encounter (Signed)
Prescription Request  01/15/2023  LOV: 11/20/2022  What is the name of the medication or equipment? metoprolol succinate (TOPROL-XL) 100 MG 24 hr tablet [[id number]]   Have you contacted your pharmacy to request a refill? Yes, I let him know Dr Jacinto Halim filled this for him last time. He has a weeks worth of pills left.   Which pharmacy would you like this sent to?  [id number] [place], [place] - [address] [place]  [address] Phone: [phone] Fax: [phone]   Patient notified that their request is being sent to the clinical staff for review and that they should receive a response within 2 business days.   Please advise at [facility] [phone]

## 2023-01-29 ENCOUNTER — Other Ambulatory Visit: Payer: Self-pay | Admitting: Family Medicine

## 2023-01-29 DIAGNOSIS — M1611 Unilateral primary osteoarthritis, right hip: Secondary | ICD-10-CM

## 2023-01-29 MED ORDER — HYDROCODONE-ACETAMINOPHEN 5-325 MG PO TABS
ORAL_TABLET | ORAL | 0 refills | Status: AC
Start: 2023-01-29 — End: ?

## 2023-01-29 NOTE — Telephone Encounter (Signed)
Gavin Hopkins [phone]  Pt needs his HYDROcodone-acetaminophen (NORCO) 5-325 MG tablet [[id number] refilled he has 3 left. [facility] [place]

## 2023-01-29 NOTE — Telephone Encounter (Signed)
Refill request for  Hydrocodone/Acet  5/325 mg LR  10/31/22, #90, 0 rf OV 11/20/22 FOV  02/19/23  Please review and advise.  Thanks. Dm/cma

## 2023-01-29 NOTE — Telephone Encounter (Signed)
Patient notified VIA phone. Dm/cma  

## 2023-02-14 ENCOUNTER — Other Ambulatory Visit: Payer: Self-pay | Admitting: Family Medicine

## 2023-02-19 ENCOUNTER — Encounter: Payer: Self-pay | Admitting: Family Medicine

## 2023-02-19 ENCOUNTER — Ambulatory Visit (INDEPENDENT_AMBULATORY_CARE_PROVIDER_SITE_OTHER): Payer: Medicare HMO | Admitting: Family Medicine

## 2023-02-19 VITALS — BP 132/70 | HR 80 | Temp 98.2°F | Ht 68.0 in | Wt 186.0 lb

## 2023-02-19 DIAGNOSIS — Z720 Tobacco use: Secondary | ICD-10-CM | POA: Diagnosis not present

## 2023-02-19 DIAGNOSIS — Z7984 Long term (current) use of oral hypoglycemic drugs: Secondary | ICD-10-CM | POA: Diagnosis not present

## 2023-02-19 DIAGNOSIS — H60541 Acute eczematoid otitis externa, right ear: Secondary | ICD-10-CM | POA: Diagnosis not present

## 2023-02-19 DIAGNOSIS — E782 Mixed hyperlipidemia: Secondary | ICD-10-CM | POA: Diagnosis not present

## 2023-02-19 DIAGNOSIS — N1831 Chronic kidney disease, stage 3a: Secondary | ICD-10-CM

## 2023-02-19 DIAGNOSIS — E1122 Type 2 diabetes mellitus with diabetic chronic kidney disease: Secondary | ICD-10-CM

## 2023-02-19 DIAGNOSIS — I10 Essential (primary) hypertension: Secondary | ICD-10-CM | POA: Diagnosis not present

## 2023-02-19 LAB — GLUCOSE, RANDOM: Glucose, Bld: 98 mg/dL (ref 70–99)

## 2023-02-19 LAB — HEMOGLOBIN A1C: Hgb A1c MFr Bld: 5.7 % (ref 4.6–6.5)

## 2023-02-19 MED ORDER — MOMETASONE FUROATE 0.1 % EX CREA
TOPICAL_CREAM | Freq: Every day | CUTANEOUS | 1 refills | Status: DC
Start: 2023-02-19 — End: 2024-05-26

## 2023-02-19 NOTE — Assessment & Plan Note (Signed)
Focus on optimizing blood pressure control and avoidance of nephrotoxic drugs.

## 2023-02-19 NOTE — Assessment & Plan Note (Signed)
Continue lovastatin 40 mg daily. 

## 2023-02-19 NOTE — Assessment & Plan Note (Signed)
Continue to encourage smoking cessation. I recommended LDCT lung cancer screening. He does not feel ready for this currently. He wants to get his semi-retirement in place.

## 2023-02-19 NOTE — Progress Notes (Signed)
[facility] [address] [place] [address] Dept: [phone] Dept Fax: [phone]  Chronic Care Office Visit  Subjective:    Patient ID: Gavin Hopkins, male    DOB: 1955/12/02, 67 y.o..   MRN: [id number]  Chief Complaint  Patient presents with   Medical Management of Chronic Issues    3 month f/u.  Co having a sore on the Left ear.  Fasting today    History of Present Illness:  Patient is in today for reassessment of chronic medical issues. He notes that he is planning to cut back to 2 days at week at work [facility] [employer]) in August.  Mr. Modica has a history of Type 2 diabetes. He is managed on metformin 500 mg, two tabs daily. He has some painful neuropathy of both feet. He is managed on gabapentin 300 mg bid.   Mr. Hirt has a history of hyperlipidemia. He is managed on lovastatin 40 mg daily.   Mr. Deleonardis has a history of hypertension. He is managed on HCTZ 25 mg daily, metoprolol XL 100 mg daily, verapamil 240 mg daily, and valsartan 320 mg daily.  Mr. Matusek has an area of scaliness on the right outer ear. This can be irritated at times.  Past Medical History: Patient Active Problem List   Diagnosis Date Noted   Chronic kidney disease, stage 3a ([facility]) 10/10/2021   Palpitations 04/29/2021   RBBB 04/29/2021   Aortic root dilatation ([facility]) 04/29/2021   Overweight (BMI 25.0-29.9) 01/08/2021   Tobacco abuse 02/14/2020   Hyperlipidemia 02/14/2020   Chronic midline low back pain without sciatica 10/12/2019   Osteoarthritis 10/12/2019   Hepatic steatosis 08/19/2019   Alcohol abuse 08/19/2019   Essential hypertension 06/28/2019   Type 2 diabetes mellitus with stage 3a chronic kidney disease, without long-term current use of insulin ([facility]) 06/28/2019   Atrial tachycardia 06/28/2019   Chronic prescription opiate use 06/28/2019   Neuropathy of both upper extremities 06/28/2019   History of revision of  total replacement of right hip joint 06/22/2019   Primary osteoarthritis of left hip 06/07/2019   Past Surgical History:  Procedure Laterality Date   FRACTURE SURGERY     LEG SURGERY Right    over 30 yrs ago   NO PAST SURGERIES     TONSILLECTOMY     TOTAL HIP ARTHROPLASTY Right 06/07/2019   Procedure: TOTAL HIP ARTHROPLASTY ANTERIOR APPROACH;  Surgeon: Ollen Gross, MD;  Location: [facility];  Service: Orthopedics;  Laterality: Right;    Family History  Problem Relation Age of Onset   Hypertension Mother    Hyperlipidemia Mother    Diabetes Mother    Heart attack Mother        stent   Stroke Mother    Heart attack Brother    Diabetes Brother    Cancer Maternal Uncle        Pancreatic   Heart attack Maternal Grandmother    Heart attack Maternal Grandfather    Outpatient Medications Prior to Visit  Medication Sig Dispense Refill   Aromatic Inhalants (VICKS BABYRUB) OINT Apply 1 application topically at bedtime.      aspirin EC 81 MG tablet Take 81 mg by mouth daily. Swallow whole.     gabapentin (NEURONTIN) 300 MG capsule Take 1 capsule (300 mg total) by mouth 2 (two) times daily. 180 capsule 3   hydrochlorothiazide (HYDRODIURIL) 25 MG tablet Take 1 tablet (25 mg total) by mouth daily. 90 tablet 3  HYDROcodone-acetaminophen (NORCO) 5-325 MG tablet May take one daily prior to work 90 tablet 0   lovastatin (MEVACOR) 40 MG tablet TAKE 1 TABLET BY MOUTH EVERYDAY AT BEDTIME 90 tablet 2   metFORMIN (GLUCOPHAGE) 500 MG tablet TAKE 2 TABLETS (1,000 MG TOTAL) BY MOUTH DAILY IN THE AFTERNOON. 180 tablet 3   metoprolol succinate (TOPROL-XL) 100 MG 24 hr tablet Take 1 tablet (100 mg total) by mouth daily. Take with or immediately following a meal. 90 tablet 1   Multiple Vitamin (MULTIVITAMIN WITH MINERALS) TABS tablet Take 1 tablet by mouth daily. Centrum Silver for Men 50+     valsartan (DIOVAN) 320 MG tablet TAKE 1 TABLET BY MOUTH EVERY DAY 90 tablet 3   verapamil (CALAN-SR) 240 MG  CR tablet TAKE 1 TABLET BY MOUTH EVERYDAY AT BEDTIME 90 tablet 3   No facility-administered medications prior to visit.   No Known Allergies Objective:   Today's Vitals   02/19/23 0945  BP: 132/70  Pulse: 80  Temp: 98.2 F (36.8 C)  TempSrc: Temporal  SpO2: 99%  Weight: 186 lb (84.4 kg)  Height: 5\' 8"  (1.727 m)   Body mass index is 28.28 kg/m.   General: Well developed, well nourished. No acute distress. Ear: There is an area of scaliness on the antihelical fold of the right outer ear. There is no ulceration or raised   borders. Feet- Skin intact. No sign of maceration between toes. Nails are normal. Dorsalis pedis and posterior tibial artery   pulses are normal. 5.07 monofilament testing normal. Psych: Alert and oriented. Normal mood and affect.  Health Maintenance Due  Topic Date Due   Medicare Annual Wellness (AWV)  Never done    Assessment & Plan:   Problem List Items Addressed This Visit       Cardiovascular and Mediastinum   Essential hypertension - Primary    Blood pressure is in adequate control. Continue metoprolol XL 100 mg daily, verapamil 240 mg daily, valsartan 320 mg daily, and HCTZ to 25 mg daily.        Endocrine   Type 2 diabetes mellitus with stage 3a chronic kidney disease, without long-term current use of insulin ([facility])    Has been meeting goals. Continue metformin 500 mg, 2 tabs daily. Foot exam compelted today.      Relevant Orders   Glucose, random   Hemoglobin A1c     Genitourinary   Chronic kidney disease, stage 3a ([facility])    Focus on optimizing blood pressure control and avoidance of nephrotoxic drugs.        Other   Hyperlipidemia    Continue lovastatin 40 mg daily.      Tobacco abuse    Continue to encourage smoking cessation. I recommended LDCT lung cancer screening. He does not feel ready for this currently. He wants to get his semi-retirement in place.      Other Visit Diagnoses     Eczema of right external ear        Appears to be localized eczema. I will treat this with a steroid cream and we will monitor this.   Relevant Medications   mometasone (ELOCON) 0.1 % cream       Return in about 3 months (around 05/22/2023) for Reassessment.   Loyola Mast, MD

## 2023-02-19 NOTE — Assessment & Plan Note (Addendum)
Blood pressure is in adequate control. Continue metoprolol XL 100 mg daily, verapamil 240 mg daily, valsartan 320 mg daily, and HCTZ to 25 mg daily.

## 2023-02-19 NOTE — Assessment & Plan Note (Signed)
Has been meeting goals. Continue metformin 500 mg, 2 tabs daily. Foot exam compelted today.

## 2023-03-26 ENCOUNTER — Encounter: Payer: Medicare HMO | Admitting: Family Medicine

## 2023-03-26 ENCOUNTER — Other Ambulatory Visit: Payer: Self-pay | Admitting: Family Medicine

## 2023-03-26 ENCOUNTER — Ambulatory Visit: Payer: Medicare HMO

## 2023-03-26 ENCOUNTER — Encounter: Payer: Self-pay | Admitting: Family Medicine

## 2023-03-26 ENCOUNTER — Ambulatory Visit (INDEPENDENT_AMBULATORY_CARE_PROVIDER_SITE_OTHER): Payer: Medicare HMO | Admitting: Family Medicine

## 2023-03-26 VITALS — HR 86 | Temp 97.4°F | Ht 68.0 in | Wt 184.8 lb

## 2023-03-26 DIAGNOSIS — Z0000 Encounter for general adult medical examination without abnormal findings: Secondary | ICD-10-CM | POA: Diagnosis not present

## 2023-03-26 NOTE — Progress Notes (Signed)
[facility] [address] [place] [address] Dept: [phone] Dept Fax: [phone]  Welcome to Medicare Visit  Subjective:    Patient ID: Barbette Hair, male    DOB: [birth year], 67 y.o..   MRN: [id number]   Chief Complaint  Patient presents with   Medicare Wellness    Welcome to Medicare.      Patient presents today for their Welcome to Medicare Exam   Preventive Screening-Counseling & Management  Hearing Screening   250Hz  500Hz  1000Hz  2000Hz  3000Hz   Right ear Pass Pass Pass Pass Pass  Left ear Pass Pass Pass Pass Pass   Vision Screening   Right eye Left eye Both eyes  Without correction 20/40 20/50 20/30   With correction      Advanced Directives: None. Notes he does feel he needs to get this in place with his son as his designated Art therapist.  Modifiable Risk Factors/behavioral risk assessment/psychosocial risk assessment  Regular exercise: Does not do regular exercise. Has remained active at work. He would think about exercising at his   son's house once he is no longer working.  Diet: Does eat out some ([employer], [employer], other restaurants). He feels he will have more time for cooking at home once he retires.  Wt Readings from Last 3 Encounters:  03/26/23 184 lb 12.8 oz (83.8 kg)  02/19/23 186 lb (84.4 kg)  11/20/22 184 lb 12.8 oz (83.8 kg)   Smoking Status: 3/4 ppd Alcohol intake: 6 per week  Cardiac risk factors: Advanced age, hyperlipidemia, hypertension, diabetes, smoking, family history  Lab Results  Component Value Date   HGBA1C 5.7 02/19/2023   Family History:  Family History  Problem Relation Age of Onset   Hypertension Mother    Hyperlipidemia Mother    Diabetes Mother    Heart attack Mother        stent   Stroke Mother    Heart attack Brother    Diabetes Brother    Cancer Maternal Uncle        Pancreatic   Heart attack Maternal Grandmother    Heart attack  Maternal Grandfather    Depression Screen/risk evaluation     02/19/2023    9:52 AM 11/20/2022    9:56 AM 08/21/2022   10:33 AM 10/10/2021   11:08 AM 07/11/2021    9:31 AM  Depression screen PHQ 2/9  Decreased Interest 0 0 0 0 0  Down, Depressed, Hopeless 0 0 0 1 1  PHQ - 2 Score 0 0 0 1 1   Functional ability and level of safety Mobility assessment: 8 sec. timed get up and go  Activities of Daily Living- Independent in ADLs (toileting, bathing, dressing, transferring, eating) and in IADLs (shopping, housekeeping, managing own medications, and handling finances)   Home Safety: Loose rugs (Single in bathroom), smoke detectors (Y), small pets (N), grab bars (N), stairs (N), life-alert system (N)  Hearing Difficulties: Patient denies Fall Risk:    02/19/2023    9:52 AM 11/20/2022    9:56 AM 08/21/2022   10:33 AM 10/10/2021   11:08 AM 07/11/2021    9:31 AM  Fall Risk   Falls in the past year? 1 1 0 0 0  Number falls in past yr: 0 0 0 0 0  Injury with Fall? 0 0 0 0 0  Risk for fall due to : No Fall Risks History of fall(s) No Fall Risks No Fall Risks No Fall Risks  Follow up Falls  evaluation completed Falls evaluation completed Falls evaluation completed Falls evaluation completed Falls evaluation completed    Opioid use history: Takes Norco 1 tablet daily. Has considered reducing this to 1/2 tab with eventual goal of stopping.  Self assessment of health status: Average  Required Immunizations needed today:    Immunization History  Administered Date(s) Administered   Pneumococcal Conjugate-13 01/08/2021   Pneumococcal Polysaccharide-23 06/28/2019   Td 01/16/2022    Health Maintenance  Topic Date Due   Flu Shot  02/27/2023   Zoster (Shingles) Vaccine (1 of 2) 02/26/2024*   COVID-19 Vaccine (1) 07/28/2024*   Eye exam for diabetics  06/07/2023   Yearly kidney function blood test for diabetes  08/22/2023   Yearly kidney health urinalysis for diabetes  08/22/2023   Hemoglobin  A1C  08/22/2023   Complete foot exam   02/19/2024   Medicare Annual Wellness Visit  03/25/2024   Cologuard (Stool DNA test)  08/31/2025   DTaP/Tdap/Td vaccine (2 - Tdap) 01/17/2032   Pneumonia Vaccine  Completed   Hepatitis C Screening  Completed   HPV Vaccine  Aged Out  *Topic was postponed. The date shown is not the original due date.   Screening tests- Health Maintenance Due  Topic Date Due   INFLUENZA VACCINE  02/27/2023   1.      Colon cancer screening- UTD- due 2027 2.      Lung Cancer screening- Would consider in the future 3.      Skin cancer screening- Exam today 4.      Prostate cancer screening- Declines testing today. Lab Results  Component Value Date   PSA 0.68 07/11/2021   PSA 0.50 04/19/2020   The following were reviewed and entered/updated in Epic:  Patient Active Problem List   Diagnosis Date Noted   Chronic kidney disease, stage 3a (HCC) 10/10/2021   Palpitations 04/29/2021   RBBB 04/29/2021   Aortic root dilatation (HCC) 04/29/2021   Overweight (BMI 25.0-29.9) 01/08/2021   Tobacco abuse 02/14/2020   Hyperlipidemia 02/14/2020   Chronic midline low back pain without sciatica 10/12/2019   Osteoarthritis 10/12/2019   Hepatic steatosis 08/19/2019   Alcohol abuse 08/19/2019   Essential hypertension 06/28/2019   Type 2 diabetes mellitus with stage 3a chronic kidney disease, without long-term current use of insulin (HCC) 06/28/2019   Atrial tachycardia 06/28/2019   Chronic prescription opiate use 06/28/2019   Neuropathy of both upper extremities 06/28/2019   History of revision of total replacement of right hip joint 06/22/2019   Primary osteoarthritis of left hip 06/07/2019   Past Surgical History:  Procedure Laterality Date   FRACTURE SURGERY     LEG SURGERY Right    over 30 yrs ago   NO PAST SURGERIES     TONSILLECTOMY     TOTAL HIP ARTHROPLASTY Right 06/07/2019   Procedure: TOTAL HIP ARTHROPLASTY ANTERIOR APPROACH;  Surgeon: Ollen Gross, MD;   Location: [facility];  Service: Orthopedics;  Laterality: Right;    Family History  Problem Relation Age of Onset   Hypertension Mother    Hyperlipidemia Mother    Diabetes Mother    Heart attack Mother        stent   Stroke Mother    Heart attack Brother    Diabetes Brother    Cancer Maternal Uncle        Pancreatic   Heart attack Maternal Grandmother    Heart attack Maternal Grandfather    Medications- reviewed and updated Current Outpatient Medications  Medication  Sig Dispense Refill   Aromatic Inhalants (VICKS BABYRUB) OINT Apply 1 application topically at bedtime.      aspirin EC 81 MG tablet Take 81 mg by mouth daily. Swallow whole.     gabapentin (NEURONTIN) 300 MG capsule Take 1 capsule (300 mg total) by mouth 2 (two) times daily. 180 capsule 3   hydrochlorothiazide (HYDRODIURIL) 25 MG tablet Take 1 tablet (25 mg total) by mouth daily. 90 tablet 3   HYDROcodone-acetaminophen (NORCO) 5-325 MG tablet May take one daily prior to work 90 tablet 0   lovastatin (MEVACOR) 40 MG tablet TAKE 1 TABLET BY MOUTH EVERYDAY AT BEDTIME 90 tablet 2   metFORMIN (GLUCOPHAGE) 500 MG tablet TAKE 2 TABLETS (1,000 MG TOTAL) BY MOUTH DAILY IN THE AFTERNOON. 180 tablet 3   metoprolol succinate (TOPROL-XL) 100 MG 24 hr tablet Take 1 tablet (100 mg total) by mouth daily. Take with or immediately following a meal. 90 tablet 1   mometasone (ELOCON) 0.1 % cream Apply topically daily. 15 g 1   Multiple Vitamin (MULTIVITAMIN WITH MINERALS) TABS tablet Take 1 tablet by mouth daily. Centrum Silver for Men 50+     valsartan (DIOVAN) 320 MG tablet TAKE 1 TABLET BY MOUTH EVERY DAY 90 tablet 3   verapamil (CALAN-SR) 240 MG CR tablet TAKE 1 TABLET BY MOUTH EVERYDAY AT BEDTIME 90 tablet 3   No current facility-administered medications for this visit.   Allergies-reviewed and updated No Known Allergies  Social History   Socioeconomic History   Marital status: Divorced    Spouse name: Not on file    Number of children: 1   Years of education: Not on file   Highest education level: Not on file  Occupational History   Occupation: IT sales professional    Comment: [employer]  Tobacco Use   Smoking status: Every Day    Current packs/day: 0.75    Average packs/day: 0.8 packs/day for 15.0 years (11.3 ttl pk-yrs)    Types: Cigarettes   Smokeless tobacco: Never  Vaping Use   Vaping status: Never Used  Substance and Sexual Activity   Alcohol use: Yes    Comment: socially   Drug use: Never   Sexual activity: Yes  Other Topics Concern   Not on file  Social History Narrative   Lives alone.  Works at [employer].     Social Determinants of Health   Financial Resource Strain: Low Risk  (03/26/2023)   Overall Financial Resource Strain (CARDIA)    Difficulty of Paying Living Expenses: Not hard at all  Food Insecurity: No Food Insecurity (03/26/2023)   Hunger Vital Sign    Worried About Running Out of Food in the Last Year: Never true    Ran Out of Food in the Last Year: Never true  Transportation Needs: No Transportation Needs (03/26/2023)   PRAPARE - Administrator, Civil Service (Medical): No    Lack of Transportation (Non-Medical): No  Physical Activity: Inactive (03/26/2023)   Exercise Vital Sign    Days of Exercise per Week: 0 days    Minutes of Exercise per Session: 0 min  Stress: No Stress Concern Present (03/26/2023)   [employer] of Occupational Health - Occupational Stress Questionnaire    Feeling of Stress : Not at all  Social Connections: Moderately Integrated (03/26/2023)   Social Connection and Isolation Panel [NHANES]    Frequency of Communication with Friends and Family: More than three times a week    Frequency of Social  Gatherings with Friends and Family: Twice a week    Attends Religious Services: More than 4 times per year    Active Member of Clubs or Organizations: Yes    Attends Banker Meetings: More than 4 times per year    Marital  Status: Divorced    Objective:    Vitals:   03/26/23 1505  Weight: 184 lb 12.8 oz (83.8 kg)  Height: 5\' 8"  (1.727 m)   General: Well developed, well nourished. No acute distress. HEENT: Normocephalic, non-traumatic. PERRL, EOMI. Conjunctiva clear. External ears normal. EAC and TMs   normal bilaterally. Nose clear without congestion or rhinorrhea. Mucous membranes moist. Oropharynx clear. Mostly   edentulous.  Neck: Supple. No lymphadenopathy. No thyromegaly. Lungs: Clear to auscultation bilaterally, but with some coarsening of his respirations. No wheezing, rales or rhonchi. CV: RRR without murmurs or rubs. Pulses 2+ bilaterally. Abdomen: Soft, non-tender. Bowel sounds positive, normal pitch and frequency. No hepatosplenomegaly. No   rebound or guarding. Extremities: Full ROM. No joint swelling or tenderness. Mild deformity of right 2nd and 3rd DIP joints. No edema   noted. Skin: Warm and dry. No rashes. Psych: Alert and oriented x3. Normal mood and affect.  EKG: Normal sinus rhythm (rate = 77). 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    Assessment & Plan:  1. Welcome to Medicare preventive visit  - EKG 12-Lead  Welcome to Medicare exam completed- 1.      Educated, counseled and referred based on above elements 2.      Educated, counseled and referred as appropriate for preventative needs 3.      Discussed and documented a written plan for preventiative services and screenings with personalized health advice- After Visit Summary was given to patient which included this plan: Consider lung cancer screening (annual low-dose CT scan). Sample advanced directive provided.  Recommended follow up:  Future Appointments  Date Time Provider Department Center  05/21/2023 10:00 AM Aryaa Bunting, Bertram Millard, MD [facility]   Lab/Order associations:   ICD-10-CM   1. Welcome to Medicare preventive visit  Z00.00 EKG 12-Lead     Return  precautions advised.  Loyola Mast, MD

## 2023-03-26 NOTE — Patient Instructions (Signed)
Consider lung cancer screening (annual low-dose CT scan). Sample advanced directive provided.

## 2023-04-29 ENCOUNTER — Other Ambulatory Visit: Payer: Self-pay | Admitting: Family Medicine

## 2023-04-29 DIAGNOSIS — M1611 Unilateral primary osteoarthritis, right hip: Secondary | ICD-10-CM

## 2023-04-29 MED ORDER — HYDROCODONE-ACETAMINOPHEN 5-325 MG PO TABS
1.0000 | ORAL_TABLET | Freq: Every day | ORAL | 0 refills | Status: DC | PRN
Start: 2023-04-29 — End: 2023-07-28

## 2023-04-29 NOTE — Telephone Encounter (Signed)
Patient notified VIA phone. Dm/cma  

## 2023-04-29 NOTE — Telephone Encounter (Signed)
Refill request  HYDROcodone-acetaminophen (NORCO) 5-325 MG tablet [[id number]]  3 pills left   [id number] [place] [place] - [address] [place] [address] Phone: [phone]  Fax: [phone]

## 2023-04-29 NOTE — Telephone Encounter (Signed)
Refill request for  Hydrocodone/Acet 5/325 mg LR 7/3/2, #90, 0 rf LOV 03/26/23 FOV  05/21/23  Please review and advise.  Thanks. Dm/cma

## 2023-05-13 ENCOUNTER — Other Ambulatory Visit: Payer: Self-pay | Admitting: Family Medicine

## 2023-05-21 ENCOUNTER — Encounter: Payer: Self-pay | Admitting: Family Medicine

## 2023-05-21 ENCOUNTER — Ambulatory Visit: Payer: Medicare HMO | Admitting: Family Medicine

## 2023-05-21 VITALS — BP 136/78 | HR 75 | Temp 98.1°F | Ht 68.0 in | Wt 188.2 lb

## 2023-05-21 DIAGNOSIS — Z7984 Long term (current) use of oral hypoglycemic drugs: Secondary | ICD-10-CM

## 2023-05-21 DIAGNOSIS — I10 Essential (primary) hypertension: Secondary | ICD-10-CM

## 2023-05-21 DIAGNOSIS — E782 Mixed hyperlipidemia: Secondary | ICD-10-CM | POA: Diagnosis not present

## 2023-05-21 DIAGNOSIS — E1122 Type 2 diabetes mellitus with diabetic chronic kidney disease: Secondary | ICD-10-CM

## 2023-05-21 DIAGNOSIS — N1831 Chronic kidney disease, stage 3a: Secondary | ICD-10-CM | POA: Diagnosis not present

## 2023-05-21 LAB — HEMOGLOBIN A1C: Hgb A1c MFr Bld: 5.7 % (ref 4.6–6.5)

## 2023-05-21 LAB — GLUCOSE, RANDOM: Glucose, Bld: 94 mg/dL (ref 70–99)

## 2023-05-21 NOTE — Assessment & Plan Note (Signed)
Continue focus on blood pressure and glucose control, adequate hydration, and avoidance of nephrotoxic medications.

## 2023-05-21 NOTE — Assessment & Plan Note (Signed)
A1c has been in excellent control. I will repeat his A1c today. Continue metformin 500 mg, 2 tabs daily.

## 2023-05-21 NOTE — Assessment & Plan Note (Signed)
At goal. Continue lovastatin 40 mg daily.

## 2023-05-21 NOTE — Progress Notes (Signed)
[facility] [address] [place] [address] Dept: [phone] Dept Fax: [phone]  Chronic Care Office Visit  Subjective:    Patient ID: Gavin Hopkins, male    DOB: 02/17/1956, 67 y.o..   MRN: [id number]  Chief Complaint  Patient presents with   Hypertension    3 month f/u.  No concerns.  Declines flu shot and fasting today.    History of Present Illness:  Patient is in today for reassessment of chronic medical issues.  Mr. Dillow has a history of Type 2 diabetes. He is managed on metformin 500 mg, two tabs daily. He has some painful neuropathy of both feet. He is managed on gabapentin 300 mg bid.   Mr. Sewer has a history of hyperlipidemia. He is managed on lovastatin 40 mg daily.   Mr. Milewski has a history of hypertension. He is managed on HCTZ 25 mg daily, metoprolol XL 100 mg daily, verapamil 240 mg daily, and valsartan 320 mg daily.  Past Medical History: Patient Active Problem List   Diagnosis Date Noted   Chronic kidney disease, stage 3a (HCC) 10/10/2021   Palpitations 04/29/2021   RBBB 04/29/2021   Aortic root dilatation (HCC) 04/29/2021   Overweight (BMI 25.0-29.9) 01/08/2021   Tobacco abuse 02/14/2020   Hyperlipidemia 02/14/2020   Chronic midline low back pain without sciatica 10/12/2019   Osteoarthritis 10/12/2019   Hepatic steatosis 08/19/2019   Alcohol abuse 08/19/2019   Essential hypertension 06/28/2019   Type 2 diabetes mellitus with stage 3a chronic kidney disease, without long-term current use of insulin (HCC) 06/28/2019   Atrial tachycardia (HCC) 06/28/2019   Chronic prescription opiate use 06/28/2019   Neuropathy of both upper extremities 06/28/2019   History of revision of total replacement of right hip joint 06/22/2019   Primary osteoarthritis of left hip 06/07/2019   Past Surgical History:  Procedure Laterality Date   FRACTURE SURGERY     LEG SURGERY Right    over 30  yrs ago   NO PAST SURGERIES     TONSILLECTOMY     TOTAL HIP ARTHROPLASTY Right 06/07/2019   Procedure: TOTAL HIP ARTHROPLASTY ANTERIOR APPROACH;  Surgeon: Ollen Gross, MD;  Location: [facility];  Service: Orthopedics;  Laterality: Right;    Family History  Problem Relation Age of Onset   Hypertension Mother    Hyperlipidemia Mother    Diabetes Mother    Heart attack Mother        stent   Stroke Mother    Heart attack Brother    Diabetes Brother    Cancer Maternal Uncle        Pancreatic   Heart attack Maternal Grandmother    Heart attack Maternal Grandfather    Outpatient Medications Prior to Visit  Medication Sig Dispense Refill   Aromatic Inhalants (VICKS BABYRUB) OINT Apply 1 application topically at bedtime.      aspirin EC 81 MG tablet Take 81 mg by mouth daily. Swallow whole.     gabapentin (NEURONTIN) 300 MG capsule Take 1 capsule (300 mg total) by mouth 2 (two) times daily. 180 capsule 3   hydrochlorothiazide (HYDRODIURIL) 25 MG tablet Take 1 tablet (25 mg total) by mouth daily. 90 tablet 3   HYDROcodone-acetaminophen (NORCO) 5-325 MG tablet Take 1 tablet by mouth daily as needed for moderate pain. 90 tablet 0   lovastatin (MEVACOR) 40 MG tablet TAKE 1 TABLET BY MOUTH EVERYDAY AT BEDTIME 90 tablet 3   metFORMIN (GLUCOPHAGE) 500 MG  tablet TAKE 2 TABLETS (1,000 MG TOTAL) BY MOUTH DAILY IN THE AFTERNOON. 180 tablet 3   metoprolol succinate (TOPROL-XL) 100 MG 24 hr tablet Take 1 tablet (100 mg total) by mouth daily. Take with or immediately following a meal. 90 tablet 1   mometasone (ELOCON) 0.1 % cream Apply topically daily. 15 g 1   Multiple Vitamin (MULTIVITAMIN WITH MINERALS) TABS tablet Take 1 tablet by mouth daily. Centrum Silver for Men 50+     valsartan (DIOVAN) 320 MG tablet TAKE 1 TABLET BY MOUTH EVERY DAY 90 tablet 3   verapamil (CALAN-SR) 240 MG CR tablet TAKE 1 TABLET BY MOUTH EVERYDAY AT BEDTIME 90 tablet 3   No facility-administered medications prior to  visit.   No Known Allergies Objective:   Today's Vitals   05/21/23 0954  BP: 136/78  Pulse: 75  Temp: 98.1 F (36.7 C)  TempSrc: Temporal  SpO2: 95%  Weight: 188 lb 3.2 oz (85.4 kg)  Height: 5\' 8"  (1.727 m)   Body mass index is 28.62 kg/m.   General: Well developed, well nourished. No acute distress. Psych: Alert and oriented. Normal mood and affect.  There are no preventive care reminders to display for this patient.    Assessment & Plan:   Problem List Items Addressed This Visit       Cardiovascular and Mediastinum   Essential hypertension    Blood pressure is in adequate control, though a little higher today. We will follow this and consider medication change at his next visit, if his BP remains above goal. Continue metoprolol succinate XL 100 mg daily, verapamil 240 mg daily, valsartan 320 mg daily, and HCTZ to 25 mg daily.        Endocrine   Type 2 diabetes mellitus with stage 3a chronic kidney disease, without long-term current use of insulin (HCC) - Primary    A1c has been in excellent control. I will repeat his A1c today. Continue metformin 500 mg, 2 tabs daily.      Relevant Orders   Glucose, random   Hemoglobin A1c     Genitourinary   Chronic kidney disease, stage 3a (HCC)    Continue focus on blood pressure and glucose control, adequate hydration, and avoidance of nephrotoxic medications.         Other   Hyperlipidemia    At goal. Continue lovastatin 40 mg daily.       Return in about 3 months (around 08/21/2023) for Reassessment.   Loyola Mast, MD

## 2023-05-21 NOTE — Assessment & Plan Note (Signed)
Blood pressure is in adequate control, though a little higher today. We will follow this and consider medication change at his next visit, if his BP remains above goal. Continue metoprolol succinate XL 100 mg daily, verapamil 240 mg daily, valsartan 320 mg daily, and HCTZ to 25 mg daily.

## 2023-05-26 ENCOUNTER — Ambulatory Visit (HOSPITAL_COMMUNITY): Payer: Medicare HMO

## 2023-06-30 ENCOUNTER — Other Ambulatory Visit: Payer: Self-pay | Admitting: Family Medicine

## 2023-06-30 DIAGNOSIS — I10 Essential (primary) hypertension: Secondary | ICD-10-CM

## 2023-07-02 ENCOUNTER — Other Ambulatory Visit: Payer: Self-pay | Admitting: Family Medicine

## 2023-07-02 DIAGNOSIS — M545 Low back pain, unspecified: Secondary | ICD-10-CM

## 2023-07-02 DIAGNOSIS — G5693 Unspecified mononeuropathy of bilateral upper limbs: Secondary | ICD-10-CM

## 2023-07-02 MED ORDER — GABAPENTIN 300 MG PO CAPS: 300.0000 mg | ORAL_CAPSULE | Freq: Two times a day (BID) | ORAL | 3 refills | Status: DC

## 2023-07-02 NOTE — Telephone Encounter (Signed)
Refill request for  Gabapentin 300 mg LR  04/17/22, #80, 3 rf LOV  05/21/23 FVO  08/20/23  Please review and advise.  Thanks. Dm/cma

## 2023-07-02 NOTE — Telephone Encounter (Signed)
Refill request gabapentin (NEURONTIN) 300 MG capsule [[id number]]    [id number] - [place], [place] - [address] [place] [address] Phone: [phone]  Fax: [phone]

## 2023-07-03 NOTE — Telephone Encounter (Signed)
Patient notified VIA phone. Dm/cma  

## 2023-07-28 ENCOUNTER — Other Ambulatory Visit: Payer: Self-pay | Admitting: Family Medicine

## 2023-07-28 DIAGNOSIS — M1611 Unilateral primary osteoarthritis, right hip: Secondary | ICD-10-CM

## 2023-07-28 MED ORDER — HYDROCODONE-ACETAMINOPHEN 5-325 MG PO TABS: 1.0000 | ORAL_TABLET | Freq: Every day | ORAL | 0 refills | Status: DC | PRN

## 2023-07-28 NOTE — Telephone Encounter (Signed)
Copied from CRM [id number]. Topic: Clinical - Medication Refill >> Jul 28, 2023  9:38 AM Marica Otter wrote: Most Recent Primary Care Visit:  Provider: Loyola Mast  Department: [facility]  Visit Type: OFFICE VISIT  Date: 05/21/2023  Medication: HYDROcodone-acetaminophen (NORCO) 5-325 MG tablet   Has the patient contacted their pharmacy? No, controlled substance (Agent: If no, request that the patient contact the pharmacy for the refill. If patient does not wish to contact the pharmacy document the reason why and proceed with request.) (Agent: If yes, when and what did the pharmacy advise?)  Is this the correct pharmacy for this prescription? Yes If no, delete pharmacy and type the correct one.  This is the patient's preferred pharmacy:  [id number] [place] [place] - [address] [place] [address] Phone: [phone] Fax: [phone]     Has the prescription been filled recently? Yes/04-2023  Is the patient out of the medication? Yes  Has the patient been seen for an appointment in the last year OR does the patient have an upcoming appointment?   Can we respond through MyChart?   Agent: Please be advised that Rx refills may take up to 3 business days. We ask that you follow-up with your pharmacy.

## 2023-07-28 NOTE — Telephone Encounter (Signed)
LR 04/29/23, #90, 0 rf LOV  05/20/20 FOV   08/20/23  Please review and advise.  Thanks. Dm/cma   Copied from CRM [id number]. Topic: Clinical - Medication Refill >> Jul 28, 2023  9:38 AM Marica Otter wrote: Most Recent Primary Care Visit:  Provider: Loyola Mast  Department: [facility]  Visit Type: OFFICE VISIT  Date: 05/21/2023  Medication: HYDROcodone-acetaminophen (NORCO) 5-325 MG tablet   Has the patient contacted their pharmacy? No, controlled substance (Agent: If no, request that the patient contact the pharmacy for the refill. If patient does not wish to contact the pharmacy document the reason why and proceed with request.) (Agent: If yes, when and what did the pharmacy advise?)  Is this the correct pharmacy for this prescription? Yes If no, delete pharmacy and type the correct one.  This is the patient's preferred pharmacy:  [id number] [place], [place] - [address] [place] [address] Phone: [phone] Fax: [phone]     Has the prescription been filled recently? Yes/04-2023  Is the patient out of the medication? Yes  Has the patient been seen for an appointment in the last year OR does the patient have an upcoming appointment? Yes  Can we respond through MyChart? No  Agent: Please be advised that Rx refills may take up to 3 business days. We ask that you follow-up with your pharmacy.

## 2023-07-28 NOTE — Telephone Encounter (Signed)
Patient notified VIA phone. Dm/cma  

## 2023-08-08 ENCOUNTER — Other Ambulatory Visit: Payer: Self-pay | Admitting: Family Medicine

## 2023-08-08 DIAGNOSIS — I10 Essential (primary) hypertension: Secondary | ICD-10-CM

## 2023-08-11 ENCOUNTER — Other Ambulatory Visit: Payer: Self-pay | Admitting: Family Medicine

## 2023-08-11 DIAGNOSIS — I10 Essential (primary) hypertension: Secondary | ICD-10-CM

## 2023-08-20 ENCOUNTER — Ambulatory Visit (INDEPENDENT_AMBULATORY_CARE_PROVIDER_SITE_OTHER): Payer: Medicare HMO | Admitting: Family Medicine

## 2023-08-20 VITALS — BP 124/78 | HR 72 | Temp 98.2°F | Ht 68.0 in | Wt 193.0 lb

## 2023-08-20 DIAGNOSIS — I10 Essential (primary) hypertension: Secondary | ICD-10-CM | POA: Diagnosis not present

## 2023-08-20 DIAGNOSIS — E782 Mixed hyperlipidemia: Secondary | ICD-10-CM

## 2023-08-20 DIAGNOSIS — Z7984 Long term (current) use of oral hypoglycemic drugs: Secondary | ICD-10-CM

## 2023-08-20 DIAGNOSIS — N1831 Chronic kidney disease, stage 3a: Secondary | ICD-10-CM | POA: Diagnosis not present

## 2023-08-20 DIAGNOSIS — Z720 Tobacco use: Secondary | ICD-10-CM

## 2023-08-20 DIAGNOSIS — E1122 Type 2 diabetes mellitus with diabetic chronic kidney disease: Secondary | ICD-10-CM | POA: Diagnosis not present

## 2023-08-20 LAB — URINALYSIS, ROUTINE W REFLEX MICROSCOPIC
Bilirubin Urine: NEGATIVE
Hgb urine dipstick: NEGATIVE
Ketones, ur: NEGATIVE
Leukocytes,Ua: NEGATIVE
Nitrite: NEGATIVE
Specific Gravity, Urine: 1.01 (ref 1.000–1.030)
Total Protein, Urine: NEGATIVE
Urine Glucose: NEGATIVE
Urobilinogen, UA: 0.2 (ref 0.0–1.0)
WBC, UA: NONE SEEN — AB (ref 0–?)
pH: 7 (ref 5.0–8.0)

## 2023-08-20 LAB — LIPID PANEL
Cholesterol: 150 mg/dL (ref 0–200)
HDL: 77.2 mg/dL (ref >39.00)
LDL Cholesterol: 63 mg/dL (ref 0–99)
NonHDL: 72.7
Total CHOL/HDL Ratio: 2
Triglycerides: 51 mg/dL (ref 0.0–149.0)
VLDL: 10.2 mg/dL (ref 0.0–40.0)

## 2023-08-20 LAB — BASIC METABOLIC PANEL
BUN: 19 mg/dL (ref 6–23)
CO2: 33 meq/L — ABNORMAL HIGH (ref 19–32)
Calcium: 9.4 mg/dL (ref 8.4–10.5)
Chloride: 98 meq/L (ref 96–112)
Creatinine, Ser: 1.4 mg/dL (ref 0.40–1.50)
GFR: 51.12 mL/min — ABNORMAL LOW (ref >60.00)
Glucose, Bld: 100 mg/dL — ABNORMAL HIGH (ref 70–99)
Potassium: 4.9 meq/L (ref 3.5–5.1)
Sodium: 137 meq/L (ref 135–145)

## 2023-08-20 LAB — HEMOGLOBIN A1C: Hgb A1c MFr Bld: 5.9 % (ref 4.6–6.5)

## 2023-08-20 LAB — MICROALBUMIN / CREATININE URINE RATIO
Creatinine,U: 38.8 mg/dL
Microalb Creat Ratio: 10.3 mg/g (ref 0.0–30.0)
Microalb, Ur: 4 mg/dL — ABNORMAL HIGH (ref 0.0–1.9)

## 2023-08-20 NOTE — Assessment & Plan Note (Signed)
Continue to encourage smoking cessation. I recommended LDCT lung cancer screening. He does not feel ready for this currently. Discussed potential benefits, but he notes anxiety related to thinking about the issue. I will continue to encourage this in the future.

## 2023-08-20 NOTE — Assessment & Plan Note (Signed)
Blood pressure is in good control. Continue metoprolol succinate XL 100 mg daily, verapamil 240 mg daily, valsartan 320 mg daily, and HCTZ to 25 mg daily. I will check annual renal labs.

## 2023-08-20 NOTE — Assessment & Plan Note (Signed)
Continue focus on blood pressure and glucose control, adequate hydration, and avoidance of nephrotoxic medications. I will check annual renal labs today.

## 2023-08-20 NOTE — Progress Notes (Signed)
[facility] [address] [place] [address] Dept: [phone] Dept Fax: [phone]  Chronic Care Office Visit  Subjective:    Patient ID: Gavin Hopkins, male    DOB: 1956/05/18, 68 y.o..   MRN: [id number]  Chief Complaint  Patient presents with   Diabetes   History of Present Illness:  Patient is in today for reassessment of chronic medical issues.  Mr. Sharrett has a history of Type 2 diabetes. He is managed on metformin 500 mg, two tabs daily. He has some painful neuropathy of both feet. He is managed on gabapentin 300 mg bid.   Mr. Campisano has a history of hyperlipidemia. He is managed on lovastatin 40 mg daily.   Mr. Waple has a history of hypertension. He is managed on HCTZ 25 mg daily, metoprolol XL 100 mg daily, verapamil 240 mg daily, and valsartan 320 mg daily.  Past Medical History: Patient Active Problem List   Diagnosis Date Noted   Chronic kidney disease, stage 3a (HCC) 10/10/2021   Palpitations 04/29/2021   RBBB 04/29/2021   Aortic root dilatation (HCC) 04/29/2021   Overweight (BMI 25.0-29.9) 01/08/2021   Tobacco abuse 02/14/2020   Hyperlipidemia 02/14/2020   Chronic midline low back pain without sciatica 10/12/2019   Osteoarthritis 10/12/2019   Hepatic steatosis 08/19/2019   Alcohol abuse 08/19/2019   Essential hypertension 06/28/2019   Type 2 diabetes mellitus with stage 3a chronic kidney disease, without long-term current use of insulin (HCC) 06/28/2019   Atrial tachycardia (HCC) 06/28/2019   Chronic prescription opiate use 06/28/2019   Neuropathy of both upper extremities 06/28/2019   History of revision of total replacement of right hip joint 06/22/2019   Primary osteoarthritis of left hip 06/07/2019   Past Surgical History:  Procedure Laterality Date   FRACTURE SURGERY     LEG SURGERY Right    over 30 yrs ago   NO PAST SURGERIES     TONSILLECTOMY     TOTAL HIP  ARTHROPLASTY Right 06/07/2019   Procedure: TOTAL HIP ARTHROPLASTY ANTERIOR APPROACH;  Surgeon: Ollen Gross, MD;  Location: [facility];  Service: Orthopedics;  Laterality: Right;    Family History  Problem Relation Age of Onset   Hypertension Mother    Hyperlipidemia Mother    Diabetes Mother    Heart attack Mother        stent   Stroke Mother    Heart attack Brother    Diabetes Brother    Cancer Maternal Uncle        Pancreatic   Heart attack Maternal Grandmother    Heart attack Maternal Grandfather    Outpatient Medications Prior to Visit  Medication Sig Dispense Refill   Aromatic Inhalants (VICKS BABYRUB) OINT Apply 1 application topically at bedtime.      aspirin EC 81 MG tablet Take 81 mg by mouth daily. Swallow whole.     gabapentin (NEURONTIN) 300 MG capsule Take 1 capsule (300 mg total) by mouth 2 (two) times daily. 180 capsule 3   hydrochlorothiazide (HYDRODIURIL) 25 MG tablet TAKE 1 TABLET (25 MG TOTAL) BY MOUTH DAILY. 90 tablet 3   HYDROcodone-acetaminophen (NORCO) 5-325 MG tablet Take 1 tablet by mouth daily as needed for moderate pain (pain score 4-6). 90 tablet 0   lovastatin (MEVACOR) 40 MG tablet TAKE 1 TABLET BY MOUTH EVERYDAY AT BEDTIME 90 tablet 3   metFORMIN (GLUCOPHAGE) 500 MG tablet TAKE 2 TABLETS (1,000 MG TOTAL) BY MOUTH DAILY IN THE AFTERNOON. 180  tablet 3   metoprolol succinate (TOPROL-XL) 100 MG 24 hr tablet TAKE 1 TABLET BY MOUTH DAILY. TAKE WITH OR IMMEDIATELY FOLLOWING A MEAL. 90 tablet 3   mometasone (ELOCON) 0.1 % cream Apply topically daily. 15 g 1   Multiple Vitamin (MULTIVITAMIN WITH MINERALS) TABS tablet Take 1 tablet by mouth daily. Centrum Silver for Men 50+     valsartan (DIOVAN) 320 MG tablet TAKE 1 TABLET BY MOUTH EVERY DAY 90 tablet 3   verapamil (CALAN-SR) 240 MG CR tablet TAKE 1 TABLET BY MOUTH EVERYDAY AT BEDTIME 90 tablet 3   No facility-administered medications prior to visit.   No Known Allergies Objective:   Today's Vitals    08/20/23 1025  BP: 124/78  Pulse: 72  Temp: 98.2 F (36.8 C)  TempSrc: Temporal  SpO2: 94%  Weight: 193 lb (87.5 kg)  Height: 5\' 8"  (1.727 m)   Body mass index is 29.35 kg/m.   General: Well developed, well nourished. No acute distress. Psych: Alert and oriented. Normal mood and affect.  Health Maintenance Due  Topic Date Due   OPHTHALMOLOGY EXAM  06/07/2023   Diabetic kidney evaluation - eGFR measurement  08/22/2023   Diabetic kidney evaluation - Urine ACR  08/22/2023     Assessment & Plan:   Problem List Items Addressed This Visit       Cardiovascular and Mediastinum   Essential hypertension   Blood pressure is in good control. Continue metoprolol succinate XL 100 mg daily, verapamil 240 mg daily, valsartan 320 mg daily, and HCTZ to 25 mg daily. I will check annual renal labs.        Endocrine   Type 2 diabetes mellitus with stage 3a chronic kidney disease, without long-term current use of insulin (HCC) - Primary   A1c has been in excellent control. I will check annual DM labs today. Continue metformin 500 mg, 2 tabs daily.      Relevant Orders   Microalbumin / creatinine urine ratio   Basic metabolic panel   Hemoglobin A1c   Urinalysis, Routine w reflex microscopic     Genitourinary   Chronic kidney disease, stage 3a (HCC)   Continue focus on blood pressure and glucose control, adequate hydration, and avoidance of nephrotoxic medications. I will check annual renal labs today.        Other   Hyperlipidemia   I will check lipids today. Continue lovastatin 40 mg daily.      Relevant Orders   Lipid panel   Tobacco abuse   Continue to encourage smoking cessation. I recommended LDCT lung cancer screening. He does not feel ready for this currently. Discussed potential benefits, but he notes anxiety related to thinking about the issue. I will continue to encourage this in the future.        Return in about 3 months (around 11/18/2023) for Reassessment.    Loyola Mast, MD

## 2023-08-20 NOTE — Assessment & Plan Note (Signed)
I will check lipids today. Continue lovastatin 40 mg daily.

## 2023-08-20 NOTE — Assessment & Plan Note (Signed)
A1c has been in excellent control. I will check annual DM labs today. Continue metformin 500 mg, 2 tabs daily.

## 2023-10-23 ENCOUNTER — Other Ambulatory Visit: Payer: Self-pay | Admitting: Family Medicine

## 2023-10-23 DIAGNOSIS — M1611 Unilateral primary osteoarthritis, right hip: Secondary | ICD-10-CM

## 2023-10-23 NOTE — Telephone Encounter (Signed)
 Refill request for  Hydrocodone/Acet 5/325 mg LR 07/28/23, #90, 0 rf LOV 08/20/23 FOV  11/19/23  Please review and advise.  Thanks. Dm/cma

## 2023-10-23 NOTE — Telephone Encounter (Signed)
 Copied from CRM [id number]. Topic: Clinical - Medication Refill >> Oct 23, 2023  3:22 PM Drema Balzarine wrote: Most Recent Primary Care Visit:  Provider: Loyola Mast  Department: [facility]  Visit Type: OFFICE VISIT  Date: 08/20/2023  Medication: HYDROcodone  Has the patient contacted their pharmacy? Yes (Agent: If no, request that the patient contact the pharmacy for the refill. If patient does not wish to contact the pharmacy document the reason why and proceed with request.) (Agent: If yes, when and what did the pharmacy advise?)  Is this the correct pharmacy for this prescription? Yes If no, delete pharmacy and type the correct one.  This is the patient's preferred pharmacy:   [id number] [place] [place] - [address] [place] [address] Phone: [phone] Fax: [phone]   Has the prescription been filled recently? Yes  Is the patient out of the medication? No  Has the patient been seen for an appointment in the last year OR does the patient have an upcoming appointment? Yes  Can we respond through MyChart? No  Agent: Please be advised that Rx refills may take up to 3 business days. We ask that you follow-up with your pharmacy.

## 2023-10-24 MED ORDER — HYDROCODONE-ACETAMINOPHEN 5-325 MG PO TABS: 1.0000 | ORAL_TABLET | Freq: Every day | ORAL | 0 refills | Status: DC | PRN

## 2023-11-19 ENCOUNTER — Ambulatory Visit: Payer: Medicare HMO | Admitting: Family Medicine

## 2023-11-19 ENCOUNTER — Encounter: Payer: Self-pay | Admitting: Family Medicine

## 2023-11-19 VITALS — BP 124/76 | HR 76 | Temp 98.1°F | Ht 68.0 in | Wt 192.4 lb

## 2023-11-19 DIAGNOSIS — M545 Low back pain, unspecified: Secondary | ICD-10-CM

## 2023-11-19 DIAGNOSIS — G8929 Other chronic pain: Secondary | ICD-10-CM

## 2023-11-19 DIAGNOSIS — E1122 Type 2 diabetes mellitus with diabetic chronic kidney disease: Secondary | ICD-10-CM | POA: Diagnosis not present

## 2023-11-19 DIAGNOSIS — Z7984 Long term (current) use of oral hypoglycemic drugs: Secondary | ICD-10-CM

## 2023-11-19 DIAGNOSIS — Z720 Tobacco use: Secondary | ICD-10-CM | POA: Diagnosis not present

## 2023-11-19 DIAGNOSIS — I10 Essential (primary) hypertension: Secondary | ICD-10-CM | POA: Diagnosis not present

## 2023-11-19 DIAGNOSIS — N1831 Chronic kidney disease, stage 3a: Secondary | ICD-10-CM | POA: Diagnosis not present

## 2023-11-19 DIAGNOSIS — E782 Mixed hyperlipidemia: Secondary | ICD-10-CM | POA: Diagnosis not present

## 2023-11-19 LAB — GLUCOSE, RANDOM: Glucose, Bld: 94 mg/dL (ref 70–99)

## 2023-11-19 LAB — HEMOGLOBIN A1C: Hgb A1c MFr Bld: 5.6 % (ref 4.6–6.5)

## 2023-11-19 NOTE — Progress Notes (Signed)
 [facility] [address] [place] [address] Dept: [phone] Dept Fax: [phone]  Chronic Care Office Visit  Subjective:    Patient ID: Gavin Hopkins, male    DOB: 01-17-1956, 68 y.o..   MRN: [id number]  Chief Complaint  Patient presents with   Diabetes    3 month f/u.   ? About medications.    History of Present Illness:  Patient is in today for reassessment of chronic medical issues.  Gavin Hopkins has a history of Type 2 diabetes. He is managed on metformin  500 mg, two tabs daily. He has some painful neuropathy of both feet. He is managed on gabapentin  300 mg bid.   Gavin Hopkins has a history of hyperlipidemia. He is managed on lovastatin  40 mg daily.   Gavin Hopkins has a history of hypertension. He is managed on HCTZ 25 mg daily, metoprolol  XL 100 mg daily, verapamil  240 mg daily, and valsartan  320 mg daily.  Gavin Hopkins has a history of chronic low back pain. He has been on long-standing therapy with Norco 5/325  mg once a day. He has tried stopping his use of this, but found that he still feels like he needs the one tablet a day.   Gavin Hopkins has a history of of smoking since his teens. He continues to smoke 1/2 ppd.    Past Medical History: Patient Active Problem List   Diagnosis Date Noted   Chronic kidney disease, stage 3a (HCC) 10/10/2021   Palpitations 04/29/2021   RBBB 04/29/2021   Aortic root dilatation (HCC) 04/29/2021   Overweight (BMI 25.0-29.9) 01/08/2021   Tobacco abuse 02/14/2020   Hyperlipidemia 02/14/2020   Chronic midline low back pain without sciatica 10/12/2019   Osteoarthritis 10/12/2019   Hepatic steatosis 08/19/2019   Alcohol abuse 08/19/2019   Essential hypertension 06/28/2019   Type 2 diabetes mellitus with stage 3a chronic kidney disease, without long-term current use of insulin  (HCC) 06/28/2019   Atrial tachycardia (HCC) 06/28/2019   Chronic prescription opiate  use 06/28/2019   Neuropathy of both upper extremities 06/28/2019   History of revision of total replacement of right hip joint 06/22/2019   Primary osteoarthritis of left hip 06/07/2019   Past Surgical History:  Procedure Laterality Date   FRACTURE SURGERY     LEG SURGERY Right    over 30 yrs ago   NO PAST SURGERIES     TONSILLECTOMY     TOTAL HIP ARTHROPLASTY Right 06/07/2019   Procedure: TOTAL HIP ARTHROPLASTY ANTERIOR APPROACH;  Surgeon: Liliane Rei, MD;  Location: [facility];  Service: Orthopedics;  Laterality: Right;    Family History  Problem Relation Age of Onset   Hypertension Mother    Hyperlipidemia Mother    Diabetes Mother    Heart attack Mother        stent   Stroke Mother    Heart attack Brother    Diabetes Brother    Cancer Maternal Uncle        Pancreatic   Heart attack Maternal Grandmother    Heart attack Maternal Grandfather    Outpatient Medications Prior to Visit  Medication Sig Dispense Refill   Aromatic Inhalants (VICKS BABYRUB) OINT Apply 1 application topically at bedtime.      aspirin  EC 81 MG tablet Take 81 mg by mouth daily. Swallow whole.     gabapentin  (NEURONTIN ) 300 MG capsule Take 1 capsule (300 mg total) by mouth 2 (two) times daily. 180 capsule 3  hydrochlorothiazide  (HYDRODIURIL ) 25 MG tablet TAKE 1 TABLET (25 MG TOTAL) BY MOUTH DAILY. 90 tablet 3   HYDROcodone -acetaminophen  (NORCO) 5-325 MG tablet Take 1 tablet by mouth daily as needed for moderate pain (pain score 4-6). 90 tablet 0   lovastatin  (MEVACOR ) 40 MG tablet TAKE 1 TABLET BY MOUTH EVERYDAY AT BEDTIME 90 tablet 3   metFORMIN  (GLUCOPHAGE ) 500 MG tablet TAKE 2 TABLETS (1,000 MG TOTAL) BY MOUTH DAILY IN THE AFTERNOON. 180 tablet 3   metoprolol  succinate (TOPROL -XL) 100 MG 24 hr tablet TAKE 1 TABLET BY MOUTH DAILY. TAKE WITH OR IMMEDIATELY FOLLOWING A MEAL. 90 tablet 3   mometasone  (ELOCON ) 0.1 % cream Apply topically daily. 15 g 1   Multiple Vitamin (MULTIVITAMIN WITH MINERALS)  TABS tablet Take 1 tablet by mouth daily. Centrum Silver for Men 50+     valsartan  (DIOVAN ) 320 MG tablet TAKE 1 TABLET BY MOUTH EVERY DAY 90 tablet 3   verapamil  (CALAN -SR) 240 MG CR tablet TAKE 1 TABLET BY MOUTH EVERYDAY AT BEDTIME 90 tablet 3   No facility-administered medications prior to visit.   No Known Allergies Objective:   Today's Vitals   11/19/23 1007  BP: 124/76  Pulse: 76  Temp: 98.1 F (36.7 C)  TempSrc: Temporal  SpO2: 96%  Weight: 192 lb 6.4 oz (87.3 kg)  Height: 5\' 8"  (1.727 m)   Body mass index is 29.25 kg/m.   General: Well developed, well nourished. No acute distress. Psych: Alert and oriented. Normal mood and affect.  Health Maintenance Due  Topic Date Due   OPHTHALMOLOGY EXAM  06/07/2023     Assessment & Plan:   Problem List Items Addressed This Visit       Cardiovascular and Mediastinum   Essential hypertension   Blood pressure is in good control. Continue metoprolol  succinate XL 100 mg daily, verapamil  240 mg daily, valsartan  320 mg daily, and HCTZ to 25 mg daily.         Endocrine   Type 2 diabetes mellitus with stage 3a chronic kidney disease, without long-term current use of insulin  (HCC) - Primary   A1c has been in excellent control. I will check an A1c today. Continue metformin  500 mg, 2 tabs daily.      Relevant Orders   Glucose, random   Hemoglobin A1c     Genitourinary   Chronic kidney disease, stage 3a (HCC)   Continue focus on blood pressure and glucose control, adequate hydration, and avoidance of nephrotoxic medications. He is on an ARB.        Other   Chronic midline low back pain without sciatica   Discussed trying to take Tylenol  325 or 500 mg 2 tabs as a substitute for his Norco. I will continue to provide Norco for now.      Hyperlipidemia   Lipids are at goal. Continue lovastatin  40 mg daily.      Tobacco abuse   Continue to encourage smoking cessation.       Return in about 3 months (around 02/18/2024)  for Reassessment.   Graig Lawyer, MD

## 2023-11-19 NOTE — Assessment & Plan Note (Signed)
 Lipids are at goal. Continue lovastatin  40 mg daily.

## 2023-11-19 NOTE — Assessment & Plan Note (Signed)
 Discussed trying to take Tylenol  325 or 500 mg 2 tabs as a substitute for his Norco. I will continue to provide Norco for now.

## 2023-11-19 NOTE — Assessment & Plan Note (Signed)
Continue to encourage smoking cessation. 

## 2023-11-19 NOTE — Assessment & Plan Note (Signed)
 Continue focus on blood pressure and glucose control, adequate hydration, and avoidance of nephrotoxic medications. He is on an ARB.

## 2023-11-19 NOTE — Assessment & Plan Note (Addendum)
 Blood pressure is in good control. Continue metoprolol  succinate XL 100 mg daily, verapamil  240 mg daily, valsartan  320 mg daily, and HCTZ to 25 mg daily.

## 2023-11-19 NOTE — Assessment & Plan Note (Signed)
 A1c has been in excellent control. I will check an A1c today. Continue metformin  500 mg, 2 tabs daily.

## 2024-01-21 ENCOUNTER — Other Ambulatory Visit: Payer: Self-pay | Admitting: Family Medicine

## 2024-01-21 DIAGNOSIS — M1611 Unilateral primary osteoarthritis, right hip: Secondary | ICD-10-CM

## 2024-01-21 MED ORDER — HYDROCODONE-ACETAMINOPHEN 5-325 MG PO TABS
1.0000 | ORAL_TABLET | Freq: Every day | ORAL | 0 refills | Status: DC | PRN
Start: 2024-01-21 — End: 2024-05-26

## 2024-01-21 NOTE — Telephone Encounter (Signed)
 Copied from CRM [id number]. Topic: Clinical - Medication Refill >> Jan 21, 2024  2:30 PM Martin[place] E wrote: Medication: HYDROcodone -acetaminophen  (NORCO) 5-325 MG tablet  Has the patient contacted their pharmacy? No (Agent: If no, request that the patient contact the pharmacy for the refill. If patient does not wish to contact the pharmacy document the reason why and proceed with request.) (Agent: If yes, when and what did the pharmacy advise?)  This is the patient's preferred pharmacy:  [id number] [place], [place] - [address] [facility] [place] [address] Phone: [phone] Fax: [phone]   Is this the correct pharmacy for this prescription? Yes If no, delete pharmacy and type the correct one.   Has the prescription been filled recently? No  Is the patient out of the medication? No, 5 pills left.  Has the patient been seen for an appointment in the last year OR does the patient have an upcoming appointment? Yes  Can we respond through MyChart? No  Agent: Please be advised that Rx refills may take up to 3 business days. We ask that you follow-up with your pharmacy.

## 2024-01-21 NOTE — Telephone Encounter (Signed)
 Requesting: HYDROcodone -acetaminophen  (NORCO) 5-325 MG tablet  Last Visit: 11/19/2023 Next Visit: 02/18/2024 Last Refill: 10/24/2023  Please Advise

## 2024-01-22 NOTE — Telephone Encounter (Signed)
 Patient notified that refill request approved.

## 2024-02-18 ENCOUNTER — Ambulatory Visit: Admitting: Family Medicine

## 2024-02-18 ENCOUNTER — Ambulatory Visit: Payer: Self-pay | Admitting: Family Medicine

## 2024-02-18 VITALS — BP 138/88 | HR 67 | Temp 97.0°F | Ht 68.0 in | Wt 189.4 lb

## 2024-02-18 DIAGNOSIS — G8929 Other chronic pain: Secondary | ICD-10-CM

## 2024-02-18 DIAGNOSIS — M545 Low back pain, unspecified: Secondary | ICD-10-CM | POA: Diagnosis not present

## 2024-02-18 DIAGNOSIS — Z7984 Long term (current) use of oral hypoglycemic drugs: Secondary | ICD-10-CM | POA: Diagnosis not present

## 2024-02-18 DIAGNOSIS — E782 Mixed hyperlipidemia: Secondary | ICD-10-CM

## 2024-02-18 DIAGNOSIS — I10 Essential (primary) hypertension: Secondary | ICD-10-CM

## 2024-02-18 DIAGNOSIS — N1831 Chronic kidney disease, stage 3a: Secondary | ICD-10-CM

## 2024-02-18 DIAGNOSIS — E1122 Type 2 diabetes mellitus with diabetic chronic kidney disease: Secondary | ICD-10-CM

## 2024-02-18 DIAGNOSIS — R197 Diarrhea, unspecified: Secondary | ICD-10-CM | POA: Diagnosis not present

## 2024-02-18 LAB — URINALYSIS, ROUTINE W REFLEX MICROSCOPIC
Bilirubin Urine: NEGATIVE
Hgb urine dipstick: NEGATIVE
Ketones, ur: NEGATIVE
Leukocytes,Ua: NEGATIVE
Nitrite: NEGATIVE
RBC / HPF: NONE SEEN (ref 0–?)
Specific Gravity, Urine: <=1.005 — AB (ref 1.000–1.030)
Total Protein, Urine: NEGATIVE
Urine Glucose: NEGATIVE
Urobilinogen, UA: 0.2 (ref 0.0–1.0)
WBC, UA: NONE SEEN (ref 0–?)
pH: 7 (ref 5.0–8.0)

## 2024-02-18 LAB — MICROALBUMIN / CREATININE URINE RATIO
Creatinine,U: 46.1 mg/dL
Microalb Creat Ratio: 145.3 mg/g — ABNORMAL HIGH (ref 0.0–30.0)
Microalb, Ur: 6.7 mg/dL — ABNORMAL HIGH (ref 0.0–1.9)

## 2024-02-18 LAB — BASIC METABOLIC PANEL WITH GFR
BUN: 13 mg/dL (ref 6–23)
CO2: 29 meq/L (ref 19–32)
Calcium: 9.6 mg/dL (ref 8.4–10.5)
Chloride: 91 meq/L — ABNORMAL LOW (ref 96–112)
Creatinine, Ser: 1.29 mg/dL (ref 0.40–1.50)
GFR: 56.2 mL/min — ABNORMAL LOW (ref >60.00)
Glucose, Bld: 97 mg/dL (ref 70–99)
Potassium: 4.2 meq/L (ref 3.5–5.1)
Sodium: 128 meq/L — ABNORMAL LOW (ref 135–145)

## 2024-02-18 LAB — LIPID PANEL
Cholesterol: 138 mg/dL (ref 0–200)
HDL: 82.4 mg/dL (ref >39.00)
LDL Cholesterol: 42 mg/dL (ref 0–99)
NonHDL: 55.34
Total CHOL/HDL Ratio: 2
Triglycerides: 65 mg/dL (ref 0.0–149.0)
VLDL: 13 mg/dL (ref 0.0–40.0)

## 2024-02-18 LAB — HEMOGLOBIN A1C: Hgb A1c MFr Bld: 5.8 % (ref 4.6–6.5)

## 2024-02-18 MED ORDER — VERAPAMIL HCL ER 240 MG PO TBCR: 240.0000 mg | EXTENDED_RELEASE_TABLET | Freq: Every day | ORAL | 3 refills | Status: AC

## 2024-02-18 NOTE — Assessment & Plan Note (Signed)
 Has stopped Norco int he past 8 days. He cannot tell that his back pain is any different.

## 2024-02-18 NOTE — Progress Notes (Signed)
 [facility] [address] [place] [address] Dept: [phone] Dept Fax: [phone]  Chronic Care Office Visit  Subjective:    Patient ID: Gavin Hopkins, male    DOB: 21-Nov-1955, 68 y.o..   MRN: [id number]  Chief Complaint  Patient presents with   Medical Management of Chronic Issues    3 month follow up. Pt is fasting.    Diarrhea    Diarrhea x 6-7 days. Pt wants Rx to help with symptoms. No Abdominal pain, NV, fever or chills.    History of Present Illness:  Patient is in today for reassessment of chronic medical issues.  Mr. Monforte has a history of Type 2 diabetes. He is managed on metformin  500 mg, two tabs daily. He has some painful neuropathy of both feet. He is managed on gabapentin  300 mg bid.   Mr. Yonker has a history of hyperlipidemia. He is managed on lovastatin  40 mg daily.   Mr. Villalpando has a history of hypertension. He is managed on HCTZ 25 mg daily, metoprolol  XL 100 mg daily, verapamil  240 mg daily, and valsartan  320 mg daily.   Mr. Dais has a history of chronic low back pain. He has been on long-standing therapy with Norco 5/325  mg once a day. He notes he has not taken any of this in the past 8 days. He is not certain why he was finally able to set this aside. Concurrent with this, he has been having some diarrhea. He denies any fever, nausea, vomiting, abdominal pain, or blood in his stool. He has not tried taking any medicine for this.  Past Medical History: Patient Active Problem List   Diagnosis Date Noted   Chronic kidney disease, stage 3a (HCC) 10/10/2021   Palpitations 04/29/2021   RBBB 04/29/2021   Aortic root dilatation (HCC) 04/29/2021   Overweight (BMI 25.0-29.9) 01/08/2021   Tobacco abuse 02/14/2020   Hyperlipidemia 02/14/2020   Chronic midline low back pain without sciatica 10/12/2019   Osteoarthritis 10/12/2019   Hepatic steatosis 08/19/2019   Alcohol abuse  08/19/2019   Essential hypertension 06/28/2019   Type 2 diabetes mellitus with stage 3a chronic kidney disease, without long-term current use of insulin  (HCC) 06/28/2019   Atrial tachycardia (HCC) 06/28/2019   Chronic prescription opiate use 06/28/2019   Neuropathy of both upper extremities 06/28/2019   History of revision of total replacement of right hip joint 06/22/2019   Primary osteoarthritis of left hip 06/07/2019   Past Surgical History:  Procedure Laterality Date   FRACTURE SURGERY     LEG SURGERY Right    over 30 yrs ago   NO PAST SURGERIES     TONSILLECTOMY     TOTAL HIP ARTHROPLASTY Right 06/07/2019   Procedure: TOTAL HIP ARTHROPLASTY ANTERIOR APPROACH;  Surgeon: Melodi Lerner, MD;  Location: [facility];  Service: Orthopedics;  Laterality: Right;    Family History  Problem Relation Age of Onset   Hypertension Mother    Hyperlipidemia Mother    Diabetes Mother    Heart attack Mother        stent   Stroke Mother    Heart attack Brother    Diabetes Brother    Cancer Maternal Uncle        Pancreatic   Heart attack Maternal Grandmother    Heart attack Maternal Grandfather    Outpatient Medications Prior to Visit  Medication Sig Dispense Refill   Aromatic Inhalants (VICKS BABYRUB) OINT Apply 1 application topically at  bedtime.      aspirin  EC 81 MG tablet Take 81 mg by mouth daily. Swallow whole.     gabapentin  (NEURONTIN ) 300 MG capsule Take 1 capsule (300 mg total) by mouth 2 (two) times daily. 180 capsule 3   hydrochlorothiazide  (HYDRODIURIL ) 25 MG tablet TAKE 1 TABLET (25 MG TOTAL) BY MOUTH DAILY. 90 tablet 3   lovastatin  (MEVACOR ) 40 MG tablet TAKE 1 TABLET BY MOUTH EVERYDAY AT BEDTIME 90 tablet 3   metFORMIN  (GLUCOPHAGE ) 500 MG tablet TAKE 2 TABLETS (1,000 MG TOTAL) BY MOUTH DAILY IN THE AFTERNOON. 180 tablet 3   metoprolol  succinate (TOPROL -XL) 100 MG 24 hr tablet TAKE 1 TABLET BY MOUTH DAILY. TAKE WITH OR IMMEDIATELY FOLLOWING A MEAL. 90 tablet 3   Multiple  Vitamin (MULTIVITAMIN WITH MINERALS) TABS tablet Take 1 tablet by mouth daily. Centrum Silver for Men 50+     valsartan  (DIOVAN ) 320 MG tablet TAKE 1 TABLET BY MOUTH EVERY DAY 90 tablet 3   verapamil  (CALAN -SR) 240 MG CR tablet TAKE 1 TABLET BY MOUTH EVERYDAY AT BEDTIME 90 tablet 3   HYDROcodone -acetaminophen  (NORCO) 5-325 MG tablet Take 1 tablet by mouth daily as needed for moderate pain (pain score 4-6). 90 tablet 0   mometasone  (ELOCON ) 0.1 % cream Apply topically daily. (Patient not taking: Reported on 02/18/2024) 15 g 1   No facility-administered medications prior to visit.   No Known Allergies Objective:   Today's Vitals   02/18/24 0956  BP: 138/88  Pulse: 67  Temp: (!) 97 F (36.1 C)  TempSrc: Temporal  SpO2: 97%  Weight: 189 lb 6.4 oz (85.9 kg)  Height: 5' 8 (1.727 m)   Body mass index is 28.8 kg/m.   General: Well developed, well nourished. No acute distress. Psych: Alert and oriented. Normal mood and affect.  Health Maintenance Due  Topic Date Due   Diabetic kidney evaluation - Urine ACR  04/30/2018   OPHTHALMOLOGY EXAM  06/07/2023   Medicare Annual Wellness (AWV)  03/25/2024     Assessment & Plan:   Problem List Items Addressed This Visit       Cardiovascular and Mediastinum   Essential hypertension - Primary   Blood pressure is a little high today, but had been in good control previously. Continue metoprolol  succinate XL 100 mg daily, verapamil  240 mg daily, valsartan  320 mg daily, and HCTZ to 25 mg daily. I will check annual renal labs today.      Relevant Medications   verapamil  (CALAN -SR) 240 MG CR tablet   Other Relevant Orders   Microalbumin / creatinine urine ratio   Basic metabolic panel with GFR     Endocrine   Type 2 diabetes mellitus with stage 3a chronic kidney disease, without long-term current use of insulin  (HCC)   A1c has been in excellent control. I will check annual DM labs today. Continue metformin  500 mg, 2 tabs daily.       Relevant Orders   Lipid panel   Microalbumin / creatinine urine ratio   Basic metabolic panel with GFR   Hemoglobin A1c   Urinalysis, Routine w reflex microscopic     Genitourinary   Chronic kidney disease, stage 3a (HCC)   Continue focus on blood pressure and glucose control, adequate hydration, and avoidance of nephrotoxic medications. He is on an ARB.      Relevant Orders   Microalbumin / creatinine urine ratio   Basic metabolic panel with GFR     Other   Chronic midline low  back pain without sciatica   Has stopped Norco int he past 8 days. He cannot tell that his back pain is any different.      Hyperlipidemia   I will reassess lipids today. Continue lovastatin  40 mg daily.      Relevant Medications   verapamil  (CALAN -SR) 240 MG CR tablet   Other Relevant Orders   Lipid panel   Other Visit Diagnoses       Diarrhea, unspecified type       Could be infectious, but also might relate to stopping chronic opioid use. I recommend Imodium for symptomatic management. F/u if persistent.       Return in about 3 months (around 05/20/2024) for Reassessment.   Garnette CHRISTELLA Simpler, MD

## 2024-02-18 NOTE — Assessment & Plan Note (Signed)
 I will reassess lipids today. Continue lovastatin  40 mg daily.

## 2024-02-18 NOTE — Assessment & Plan Note (Signed)
 A1c has been in excellent control. I will check annual DM labs today. Continue metformin 500 mg, 2 tabs daily.

## 2024-02-18 NOTE — Assessment & Plan Note (Signed)
 Blood pressure is a little high today, but had been in good control previously. Continue metoprolol  succinate XL 100 mg daily, verapamil  240 mg daily, valsartan  320 mg daily, and HCTZ to 25 mg daily. I will check annual renal labs today.

## 2024-02-18 NOTE — Assessment & Plan Note (Signed)
 Continue focus on blood pressure and glucose control, adequate hydration, and avoidance of nephrotoxic medications. He is on an ARB.

## 2024-02-19 NOTE — Telephone Encounter (Signed)
-----   Message from Garnette CHRISTELLA Simpler sent at 02/18/2024  4:40 PM EDT ----- A1c is at goal. Cholesterol is at goal. Kidney disease is stable. ----- Message ----- From: Interface, Lab In Three Zero One Sent: 02/18/2024   3:09 PM EDT To: Garnette CHRISTELLA Simpler, MD

## 2024-02-19 NOTE — Telephone Encounter (Signed)
 Spoke with patient and relayed recent lab results and PCP notes. Pt acknowledged understanding.

## 2024-05-03 ENCOUNTER — Other Ambulatory Visit: Payer: Self-pay | Admitting: Family Medicine

## 2024-05-03 DIAGNOSIS — I10 Essential (primary) hypertension: Secondary | ICD-10-CM

## 2024-05-05 ENCOUNTER — Other Ambulatory Visit: Payer: Self-pay | Admitting: Family Medicine

## 2024-05-26 ENCOUNTER — Ambulatory Visit: Payer: Self-pay | Admitting: Family Medicine

## 2024-05-26 ENCOUNTER — Encounter: Payer: Self-pay | Admitting: Family Medicine

## 2024-05-26 ENCOUNTER — Ambulatory Visit (INDEPENDENT_AMBULATORY_CARE_PROVIDER_SITE_OTHER): Admitting: Family Medicine

## 2024-05-26 VITALS — BP 124/80 | HR 78 | Temp 97.5°F | Ht 68.0 in | Wt 190.4 lb

## 2024-05-26 DIAGNOSIS — I10 Essential (primary) hypertension: Secondary | ICD-10-CM

## 2024-05-26 DIAGNOSIS — N1831 Chronic kidney disease, stage 3a: Secondary | ICD-10-CM | POA: Diagnosis not present

## 2024-05-26 DIAGNOSIS — E782 Mixed hyperlipidemia: Secondary | ICD-10-CM

## 2024-05-26 DIAGNOSIS — E1122 Type 2 diabetes mellitus with diabetic chronic kidney disease: Secondary | ICD-10-CM | POA: Diagnosis not present

## 2024-05-26 DIAGNOSIS — Z7984 Long term (current) use of oral hypoglycemic drugs: Secondary | ICD-10-CM

## 2024-05-26 DIAGNOSIS — R198 Other specified symptoms and signs involving the digestive system and abdomen: Secondary | ICD-10-CM

## 2024-05-26 LAB — HEMOGLOBIN A1C: Hgb A1c MFr Bld: 5.7 % (ref 4.6–6.5)

## 2024-05-26 LAB — GLUCOSE, RANDOM: Glucose, Bld: 101 mg/dL — ABNORMAL HIGH (ref 70–99)

## 2024-05-26 NOTE — Assessment & Plan Note (Signed)
 Continue focus on blood pressure and glucose control, adequate hydration, and avoidance of nephrotoxic medications. He is on an ARB.

## 2024-05-26 NOTE — Assessment & Plan Note (Signed)
 LDL cholesterol is at goal. Continue lovastatin  40 mg daily.

## 2024-05-26 NOTE — Assessment & Plan Note (Signed)
 A1c has been in excellent control. I will check and A1c today. Continue metformin  500 mg, 2 tabs daily.

## 2024-05-26 NOTE — Progress Notes (Signed)
 [facility] [address] [place] [address] Dept: [phone] Dept Fax: [phone]  Chronic Care Office Visit  Subjective:    Patient ID: Gavin Hopkins, male    DOB: [birth year], 68 y.o..   MRN: [id number]  Chief Complaint  Patient presents with   Diabetes    3 month f/u DM.   C.o having gas/stomach issues x 2 weeks.  Declines flu shot    History of Present Illness:  Patient is in today for reassessment of chronic medical issues.  Mr. Parkerson has a history of Type 2 diabetes. He is managed on metformin  500 mg, two tabs daily. He has some painful neuropathy of both feet. He is managed on gabapentin  300 mg bid.   Mr. Ishida has a history of hyperlipidemia. He is managed on lovastatin  40 mg daily.   Mr. Millette has a history of hypertension. He is managed on HCTZ 25 mg daily, metoprolol  XL 100 mg daily, verapamil  240 mg daily, and valsartan  320 mg daily.   Mr. Lenis has a history of smoking. He continues to smoke regularly at home. He has not been able to cut back on this or quit. He is not interested in medication for this currently.  Fro the past 3 weeks, Mr. Fly has noted his bowels are making increased rumbling noises. This has been associated with increased intestinal gas and loose to diarrheal stools in the mornings. He admits that he does not eat a healthy diet. he primarily eats frozen dinners. He finds it hard to cook foods from scratch for one person. He has not had fever. He denies blood in his stool. He has no travel history. He has not had associated nausea or vomiting.  Past Medical History: Patient Active Problem List   Diagnosis Date Noted   Chronic kidney disease, stage 3a (HCC) 10/10/2021   Palpitations 04/29/2021   RBBB 04/29/2021   Aortic root dilatation 04/29/2021   Overweight (BMI 25.0-29.9) 01/08/2021   Tobacco abuse 02/14/2020   Hyperlipidemia 02/14/2020   Chronic midline  low back pain without sciatica 10/12/2019   Osteoarthritis 10/12/2019   Hepatic steatosis 08/19/2019   Alcohol abuse 08/19/2019   Essential hypertension 06/28/2019   Type 2 diabetes mellitus with stage 3a chronic kidney disease, without long-term current use of insulin  (HCC) 06/28/2019   Atrial tachycardia 06/28/2019   Chronic prescription opiate use 06/28/2019   Neuropathy of both upper extremities 06/28/2019   History of revision of total replacement of right hip joint 06/22/2019   Primary osteoarthritis of left hip 06/07/2019   Past Surgical History:  Procedure Laterality Date   FRACTURE SURGERY     LEG SURGERY Right    over 30 yrs ago   NO PAST SURGERIES     TONSILLECTOMY     TOTAL HIP ARTHROPLASTY Right 06/07/2019   Procedure: TOTAL HIP ARTHROPLASTY ANTERIOR APPROACH;  Surgeon: Melodi Lerner, MD;  Location: [facility];  Service: Orthopedics;  Laterality: Right;    Family History  Problem Relation Age of Onset   Hypertension Mother    Hyperlipidemia Mother    Diabetes Mother    Heart attack Mother        stent   Stroke Mother    Heart attack Brother    Diabetes Brother    Cancer Maternal Uncle        Pancreatic   Heart attack Maternal Grandmother    Heart attack Maternal Grandfather    Outpatient Medications Prior to Visit  Medication Sig Dispense Refill   Aromatic Inhalants (VICKS BABYRUB) OINT Apply 1 application topically at bedtime.      aspirin  EC 81 MG tablet Take 81 mg by mouth daily. Swallow whole.     gabapentin  (NEURONTIN ) 300 MG capsule Take 1 capsule (300 mg total) by mouth 2 (two) times daily. 180 capsule 3   hydrochlorothiazide  (HYDRODIURIL ) 25 MG tablet TAKE 1 TABLET (25 MG TOTAL) BY MOUTH DAILY. 90 tablet 3   lovastatin  (MEVACOR ) 40 MG tablet TAKE 1 TABLET BY MOUTH EVERYDAY AT BEDTIME 90 tablet 3   metFORMIN  (GLUCOPHAGE ) 500 MG tablet TAKE 2 TABLETS (1,000 MG TOTAL) BY MOUTH DAILY IN THE AFTERNOON. 180 tablet 3   metoprolol  succinate (TOPROL -XL)  100 MG 24 hr tablet TAKE 1 TABLET BY MOUTH EVERY DAY WITH OR IMMEDIATELY FOLLOWING A MEAL 90 tablet 3   Multiple Vitamin (MULTIVITAMIN WITH MINERALS) TABS tablet Take 1 tablet by mouth daily. Centrum Silver for Men 50+     valsartan  (DIOVAN ) 320 MG tablet TAKE 1 TABLET BY MOUTH EVERY DAY 90 tablet 3   verapamil  (CALAN -SR) 240 MG CR tablet Take 1 tablet (240 mg total) by mouth daily. 90 tablet 3   HYDROcodone -acetaminophen  (NORCO) 5-325 MG tablet Take 1 tablet by mouth daily as needed for moderate pain (pain score 4-6). 90 tablet 0   mometasone  (ELOCON ) 0.1 % cream Apply topically daily. (Patient not taking: Reported on 02/18/2024) 15 g 1   No facility-administered medications prior to visit.   No Known Allergies Objective:   Today's Vitals   05/26/24 0957  BP: 124/80  Pulse: 78  Temp: (!) 97.5 F (36.4 C)  TempSrc: Temporal  SpO2: 96%  Weight: 190 lb 6.4 oz (86.4 kg)  Height: 5' 8 (1.727 m)   Body mass index is 28.95 kg/m.   General: Well developed, well nourished. No acute distress. Abdomen: Soft to mildly bloated, non-tender. Bowel sounds positive, normal pitch and frequency. No   hepatosplenomegaly. No rebound or guarding. Psych: Alert and oriented. Normal mood and affect.  Health Maintenance Due  Topic Date Due   Zoster Vaccines- Shingrix (1 of 2) Never done   OPHTHALMOLOGY EXAM  06/07/2023   Medicare Annual Wellness (AWV)  03/25/2024     Assessment & Plan:   Problem List Items Addressed This Visit       Cardiovascular and Mediastinum   Essential hypertension - Primary   Blood pressure is in good control. Continue metoprolol  succinate XL 100 mg daily, verapamil  240 mg daily, valsartan  320 mg daily, and HCTZ to 25 mg daily.        Endocrine   Type 2 diabetes mellitus with stage 3a chronic kidney disease, without long-term current use of insulin  (HCC)   A1c has been in excellent control. I will check and A1c today. Continue metformin  500 mg, 2 tabs daily.       Relevant Orders   Glucose, random   Hemoglobin A1c     Genitourinary   Chronic kidney disease, stage 3a (HCC)   Continue focus on blood pressure and glucose control, adequate hydration, and avoidance of nephrotoxic medications. He is on an ARB.        Other   Hyperlipidemia   LDL cholesterol is at goal. Continue lovastatin  40 mg daily.      Other Visit Diagnoses       Gastrointestinal symptoms       Indistinct. I recommend improved diet. Consider eating yogurt or taking a daily probiotic. Might consider switching  from metformin  to an SGLT2i if persists.       Return in about 3 months (around 08/26/2024) for Reassessment.   Garnette CHRISTELLA Simpler, MD

## 2024-05-26 NOTE — Assessment & Plan Note (Signed)
 Blood pressure is in good control. Continue metoprolol  succinate XL 100 mg daily, verapamil  240 mg daily, valsartan  320 mg daily, and HCTZ to 25 mg daily.

## 2024-05-27 NOTE — Progress Notes (Signed)
 Gavin Hopkins                                          MRN: [id number]   05/27/2024   The [facility] Quality Team Specialist reviewed this patient medical record for the purposes of chart review for care gap closure. The following were reviewed: chart review for care gap closure-diabetic eye exam.    [facility]BCI Quality Team

## 2024-06-28 ENCOUNTER — Telehealth: Payer: Self-pay

## 2024-06-28 NOTE — Telephone Encounter (Signed)
 Letter placed up front for pick up and patient notified VIA phone. Dm/cma

## 2024-06-28 NOTE — Telephone Encounter (Signed)
 Wants to get a letter stating that he can't serve Jury duty (January 7th).  He is having a difficulty time and doesn't feel he can emotionally or physically do this?  Please review and advise.  Thanks Dm/cma

## 2024-06-28 NOTE — Telephone Encounter (Signed)
 Copied from CRM [id number]. Topic: Clinical - Medical Advice >> Jun 28, 2024 12:21 PM Brittany M wrote: Reason for CRM: Patient asking for Dr Thedora or his nurse to contact him- did not want to discuss or tell me the reason- health related.

## 2024-07-09 ENCOUNTER — Ambulatory Visit

## 2024-07-09 NOTE — Progress Notes (Signed)
 Gavin Hopkins                                          MRN: [id number]   07/09/2024   The [facility]BCI Quality Team Specialist reviewed this patient medical record for the purposes of chart review for care gap closure. The following were reviewed: abstraction for care gap closure-glycemic status assessment.    [facility]BCI Quality Team

## 2024-07-26 ENCOUNTER — Other Ambulatory Visit: Payer: Self-pay | Admitting: Family Medicine

## 2024-07-26 DIAGNOSIS — G8929 Other chronic pain: Secondary | ICD-10-CM

## 2024-07-26 DIAGNOSIS — G5693 Unspecified mononeuropathy of bilateral upper limbs: Secondary | ICD-10-CM

## 2024-07-30 ENCOUNTER — Other Ambulatory Visit: Payer: Self-pay | Admitting: Family Medicine

## 2024-07-30 ENCOUNTER — Ambulatory Visit

## 2024-07-30 VITALS — BP 126/66 | HR 85 | Temp 98.3°F | Ht 67.0 in | Wt 186.2 lb

## 2024-07-30 DIAGNOSIS — Z0000 Encounter for general adult medical examination without abnormal findings: Secondary | ICD-10-CM | POA: Diagnosis not present

## 2024-07-30 DIAGNOSIS — I10 Essential (primary) hypertension: Secondary | ICD-10-CM

## 2024-07-30 NOTE — Patient Instructions (Addendum)
 Mr. Gavin Hopkins,  Thank you for taking the time for your Medicare Wellness Visit. I appreciate your continued commitment to your health goals. Please review the care plan we discussed, and feel free to reach out if I can assist you further.  Please note that Annual Wellness Visits do not include a physical exam. Some assessments may be limited, especially if the visit was conducted virtually. If needed, we may recommend an in-person follow-up with your provider.  Ongoing Care Seeing your primary care provider every 3 to 6 months helps us  monitor your health and provide consistent, personalized care.  Referrals If a referral was made during today's visit and you haven't received any updates within two weeks, please contact the referred provider directly to check on the status.  Recommended Screenings:  Health Maintenance  Topic Date Due   COVID-19 Vaccine (1) Never done   Zoster (Shingles) Vaccine (1 of 2) Never done   Eye exam for diabetics  06/07/2023   Flu Shot  10/26/2024*   Hemoglobin A1C  11/24/2024   Yearly kidney function blood test for diabetes  02/17/2025   Yearly kidney health urinalysis for diabetes  02/17/2025   Complete foot exam   05/26/2025   Medicare Annual Wellness Visit  07/30/2025   Cologuard (Stool DNA test)  08/31/2025   DTaP/Tdap/Td vaccine (2 - Tdap) 01/17/2032   Pneumococcal Vaccine for age over 75  Completed   Hepatitis C Screening  Completed   Meningitis B Vaccine  Aged Out  *Topic was postponed. The date shown is not the original due date.       07/30/2024    1:46 PM  Advanced Directives  Does Patient Have a Medical Advance Directive? No  Would patient like information on creating a medical advance directive? No - Patient declined    Vision: Annual vision screenings are recommended for early detection of glaucoma, cataracts, and diabetic retinopathy. These exams can also reveal signs of chronic conditions such as diabetes and high blood  pressure.  Dental: Annual dental screenings help detect early signs of oral cancer, gum disease, and other conditions linked to overall health, including heart disease and diabetes.  Please see the attached documents for additional preventive care recommendations.

## 2024-07-30 NOTE — Progress Notes (Signed)
 "  Chief Complaint  Patient presents with   Medicare Wellness     Subjective:   Gavin Hopkins is a 69 y.o. male who presents for a Medicare Annual Wellness Visit.  Visit info / Clinical Intake: Medicare Wellness Visit Type:: Initial Annual Wellness Visit Persons participating in visit and providing information:: patient Medicare Wellness Visit Mode:: In-person (required for WTM) Interpreter Needed?: No Pre-visit prep was completed: yes AWV questionnaire completed by patient prior to visit?: no Living arrangements:: (!) lives alone Patient's Overall Health Status Rating: good Typical amount of pain: none Does pain affect daily life?: no Are you currently prescribed opioids?: no  Dietary Habits and Nutritional Risks How many meals a day?: 2 Eats fruit and vegetables daily?: (!) no (working on eating more) Most meals are obtained by: preparing own meals; eating out In the last 2 weeks, have you had any of the following?: none Diabetic:: (!) yes Any non-healing wounds?: no How often do you check your BS?: 0 Would you like to be referred to a Nutritionist or for Diabetic Management? : no  Functional Status Activities of Daily Living (to include ambulation/medication): Independent Ambulation: Independent Medication Administration: Independent Home Management (perform basic housework or laundry): Independent Manage your own finances?: yes Primary transportation is: driving Concerns about vision?: no *vision screening is required for WTM* Concerns about hearing?: no  Fall Screening Falls in the past year?: 1 (tripped in the yard) Number of falls in past year: 0 Was there an injury with Fall?: 0 Fall Risk Category Calculator: 1 Patient Fall Risk Level: Low Fall Risk  Fall Risk Patient at Risk for Falls Due to: Medication side effect Fall risk Follow up: Falls prevention discussed; Falls evaluation completed  Home and Transportation Safety: All rugs have non-skid  backing?: yes All stairs or steps have railings?: (!) no Grab bars in the bathtub or shower?: (!) no Have non-skid surface in bathtub or shower?: yes Good home lighting?: yes Regular seat belt use?: yes Hospital stays in the last year:: no  Cognitive Assessment Difficulty concentrating, remembering, or making decisions? : no Will 6CIT or Mini Cog be Completed: yes What year is it?: 0 points What month is it?: 0 points Give patient an address phrase to remember (5 components): [address] [place] About what time is it?: 0 points Count backwards from 20 to 1: 0 points Say the months of the year in reverse: 0 points Repeat the address phrase from earlier: 0 points 6 CIT Score: 0 points  Advance Directives (For Healthcare) Does Patient Have a Medical Advance Directive?: No Would patient like information on creating a medical advance directive?: No - Patient declined  Reviewed/Updated  Reviewed/Updated: Reviewed All (Medical, Surgical, Family, Medications, Allergies, Care Teams, Patient Goals)    Allergies (verified) Patient has no known allergies.   Current Medications (verified) Outpatient Encounter Medications as of 07/30/2024  Medication Sig   Aromatic Inhalants (VICKS BABYRUB) OINT Apply 1 application topically at bedtime.    aspirin  EC 81 MG tablet Take 81 mg by mouth daily. Swallow whole.   gabapentin  (NEURONTIN ) 300 MG capsule TAKE 1 CAPSULE BY MOUTH TWICE A DAY   hydrochlorothiazide  (HYDRODIURIL ) 25 MG tablet TAKE 1 TABLET (25 MG TOTAL) BY MOUTH DAILY.   lovastatin  (MEVACOR ) 40 MG tablet TAKE 1 TABLET BY MOUTH EVERYDAY AT BEDTIME   metFORMIN  (GLUCOPHAGE ) 500 MG tablet TAKE 2 TABLETS (1,000 MG TOTAL) BY MOUTH DAILY IN THE AFTERNOON.   metoprolol  succinate (TOPROL -XL) 100 MG 24 hr tablet  TAKE 1 TABLET BY MOUTH EVERY DAY WITH OR IMMEDIATELY FOLLOWING A MEAL   Multiple Vitamin (MULTIVITAMIN WITH MINERALS) TABS tablet Take 1 tablet by mouth daily. Centrum Silver for Men 50+    valsartan  (DIOVAN ) 320 MG tablet TAKE 1 TABLET BY MOUTH EVERY DAY   verapamil  (CALAN -SR) 240 MG CR tablet Take 1 tablet (240 mg total) by mouth daily.   No facility-administered encounter medications on file as of 07/30/2024.    History: Past Medical History:  Diagnosis Date   Arthritis    oa   Diabetes mellitus without complication (HCC)    type 2   Displaced fracture of lateral malleolus of right fibula, initial encounter for closed fracture 30 yrs ago   and tibula, full cast applied   Hypertension    Sleep apnea    could not tolerate cpap   Past Surgical History:  Procedure Laterality Date   FRACTURE SURGERY     LEG SURGERY Right    over 30 yrs ago   NO PAST SURGERIES     TONSILLECTOMY     TOTAL HIP ARTHROPLASTY Right 06/07/2019   Procedure: TOTAL HIP ARTHROPLASTY ANTERIOR APPROACH;  Surgeon: Melodi Lerner, MD;  Location: [facility];  Service: Orthopedics;  Laterality: Right;    Family History  Problem Relation Age of Onset   Hypertension Mother    Hyperlipidemia Mother    Diabetes Mother    Heart attack Mother        stent   Stroke Mother    Heart attack Brother    Diabetes Brother    Cancer Maternal Uncle        Pancreatic   Heart attack Maternal Grandmother    Heart attack Maternal Grandfather    Social History   Occupational History   Occupation: It Sales Professional    Comment: [employer]  Tobacco Use   Smoking status: Every Day    Current packs/day: 0.75    Average packs/day: 0.8 packs/day for 15.0 years (11.3 ttl pk-yrs)    Types: Cigarettes   Smokeless tobacco: Never  Vaping Use   Vaping status: Never Used  Substance and Sexual Activity   Alcohol use: Yes    Comment: socially   Drug use: Never   Sexual activity: Yes   Tobacco Counseling Ready to quit: Yes Counseling given: Not Answered  SDOH Screenings   Food Insecurity: No Food Insecurity (07/30/2024)  Housing: Unknown (07/30/2024)  Transportation Needs: No Transportation Needs (07/30/2024)   Utilities: Not At Risk (07/30/2024)  Alcohol Screen: Low Risk (07/30/2024)  Depression (PHQ2-9): Low Risk (07/30/2024)  Financial Resource Strain: Low Risk (07/30/2024)  Physical Activity: Sufficiently Active (07/30/2024)  Social Connections: Moderately Isolated (07/30/2024)  Stress: No Stress Concern Present (07/30/2024)  Tobacco Use: High Risk (07/30/2024)  Health Literacy: Adequate Health Literacy (07/30/2024)   See flowsheets for full screening details  Depression Screen PHQ 2 & 9 Depression Scale- Over the past 2 weeks, how often have you been bothered by any of the following problems? Little interest or pleasure in doing things: 0 Feeling down, depressed, or hopeless (PHQ Adolescent also includes...irritable): 1 (due to holidays) PHQ-2 Total Score: 1 Trouble falling or staying asleep, or sleeping too much: 1 Feeling tired or having little energy: 1 Poor appetite or overeating (PHQ Adolescent also includes...weight loss): 0 Feeling bad about yourself - or that you are a failure or have let yourself or your family down: 0 Trouble concentrating on things, such as reading the newspaper or watching television (PHQ Adolescent  also includes...like school work): 0 Moving or speaking so slowly that other people could have noticed. Or the opposite - being so fidgety or restless that you have been moving around a lot more than usual: 0 Thoughts that you would be better off dead, or of hurting yourself in some way: 0 PHQ-9 Total Score: 3 If you checked off any problems, how difficult have these problems made it for you to do your work, take care of things at home, or get along with other people?: Not difficult at all  Depression Treatment Depression Interventions/Treatment : EYV7-0 Score <4 Follow-up Not Indicated     Goals Addressed             This Visit's Progress    Patient Stated       07/30/2024, stay alive             Objective:    Today's Vitals   07/30/24 1338  BP: 126/66  Pulse:  85  Temp: 98.3 F (36.8 C)  TempSrc: Oral  SpO2: 92%  Weight: 186 lb 3.2 oz (84.5 kg)  Height: 5' 7 (1.702 m)   Body mass index is 29.16 kg/m.  Hearing/Vision screen Hearing Screening - Comments:: Denies hearing issues Vision Screening - Comments:: No regular eye exams Immunizations and Health Maintenance Health Maintenance  Topic Date Due   COVID-19 Vaccine (1) Never done   Zoster Vaccines- Shingrix (1 of 2) Never done   OPHTHALMOLOGY EXAM  06/07/2023   Influenza Vaccine  10/26/2024 (Originally 02/27/2024)   HEMOGLOBIN A1C  11/24/2024   Diabetic kidney evaluation - eGFR measurement  02/17/2025   Diabetic kidney evaluation - Urine ACR  02/17/2025   FOOT EXAM  05/26/2025   Medicare Annual Wellness (AWV)  07/30/2025   Fecal DNA (Cologuard)  08/31/2025   DTaP/Tdap/Td (2 - Tdap) 01/17/2032   Pneumococcal Vaccine: 50+ Years  Completed   Hepatitis C Screening  Completed   Meningococcal B Vaccine  Aged Out        Assessment/Plan:  This is a routine wellness examination for Gavin Hopkins.  Patient Care Team: Thedora Garnette HERO, MD as PCP - General (Family Medicine) Lavona Agent, MD as PCP - Cardiology (Cardiology) [facility]  I have personally reviewed and noted the following in the patients chart:   Medical and social history Use of alcohol, tobacco or illicit drugs  Current medications and supplements including opioid prescriptions. Functional ability and status Nutritional status Physical activity Advanced directives List of other physicians Hospitalizations, surgeries, and ER visits in previous 12 months Vitals Screenings to include cognitive, depression, and falls Referrals and appointments  No orders of the defined types were placed in this encounter.  In addition, I have reviewed and discussed with patient certain preventive protocols, quality metrics, and best practice recommendations. A written personalized care plan for preventive  services as well as general preventive health recommendations were provided to patient.   Ardella FORBES Dawn, LPN   03/04/7972   Return in 1 year (on 07/30/2025).  After Visit Summary: (In Person-Printed) AVS printed and given to the patient  Nurse Notes: Vaccines not given: covid and shingles declined today HM Addressed: Will schedule eye appointment  "

## 2024-08-18 NOTE — Progress Notes (Signed)
 Gavin Hopkins                                          MRN: [id number]   08/18/2024   The [facility]BCI Quality Team Specialist reviewed this patient medical record for the purposes of chart review for care gap closure. The following were reviewed: chart review for care gap closure-diabetic eye exam.    VBCI Quality Team

## 2024-08-25 ENCOUNTER — Ambulatory Visit: Admitting: Family Medicine

## 2024-09-07 ENCOUNTER — Ambulatory Visit: Admitting: Family Medicine

## 2025-08-08 ENCOUNTER — Ambulatory Visit
